# Patient Record
Sex: Female | Born: 1990
Health system: Southern US, Community
[De-identification: ages and names within clinical notes are randomized; demographics above are authoritative.]

## PROBLEM LIST (undated history)

## (undated) DIAGNOSIS — F419 Anxiety disorder, unspecified: Secondary | ICD-10-CM

## (undated) DIAGNOSIS — T8859XA Other complications of anesthesia, initial encounter: Secondary | ICD-10-CM

## (undated) DIAGNOSIS — K831 Obstruction of bile duct: Secondary | ICD-10-CM

## (undated) DIAGNOSIS — F32A Depression, unspecified: Secondary | ICD-10-CM

## (undated) DIAGNOSIS — O24419 Gestational diabetes mellitus in pregnancy, unspecified control: Secondary | ICD-10-CM

## (undated) DIAGNOSIS — Z9889 Other specified postprocedural states: Secondary | ICD-10-CM

## (undated) DIAGNOSIS — R112 Nausea with vomiting, unspecified: Secondary | ICD-10-CM

## (undated) DIAGNOSIS — T4145XA Adverse effect of unspecified anesthetic, initial encounter: Secondary | ICD-10-CM

## (undated) DIAGNOSIS — D649 Anemia, unspecified: Secondary | ICD-10-CM

## (undated) DIAGNOSIS — Z789 Other specified health status: Secondary | ICD-10-CM

## (undated) DIAGNOSIS — T7840XA Allergy, unspecified, initial encounter: Secondary | ICD-10-CM

## (undated) DIAGNOSIS — K219 Gastro-esophageal reflux disease without esophagitis: Secondary | ICD-10-CM

## (undated) HISTORY — DX: Other specified health status: Z78.9

## (undated) HISTORY — DX: Allergy, unspecified, initial encounter: T78.40XA

## (undated) HISTORY — PX: TUBAL LIGATION: SHX77

## (undated) HISTORY — DX: Gestational diabetes mellitus in pregnancy, unspecified control: O24.419

## (undated) HISTORY — PX: WISDOM TOOTH EXTRACTION: SHX21

## (undated) HISTORY — DX: Depression, unspecified: F32.A

---
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
## 2012-10-08 ENCOUNTER — Emergency Department: Payer: Self-pay | Admitting: Emergency Medicine

## 2012-10-08 LAB — CBC
HCT: 35.2 % (ref 35.0–47.0)
HGB: 11.4 g/dL — ABNORMAL LOW (ref 12.0–16.0)
RBC: 4.08 10*6/uL (ref 3.80–5.20)
RDW: 13.5 % (ref 11.5–14.5)
WBC: 13.4 10*3/uL — ABNORMAL HIGH (ref 3.6–11.0)

## 2012-10-08 LAB — ETHANOL: Ethanol %: <0.003 % (ref 0.000–0.080)

## 2012-10-08 LAB — COMPREHENSIVE METABOLIC PANEL
Anion Gap: 12 (ref 7–16)
Calcium, Total: 9.2 mg/dL (ref 8.5–10.1)
Co2: 22 mmol/L (ref 21–32)
EGFR (Non-African Amer.): >60
Osmolality: 284 (ref 275–301)
Potassium: 3.8 mmol/L (ref 3.5–5.1)
Sodium: 143 mmol/L (ref 136–145)

## 2012-10-08 LAB — SALICYLATE LEVEL: Salicylates, Serum: <1.7 mg/dL

## 2012-10-09 LAB — URINALYSIS, COMPLETE
Bilirubin,UR: NEGATIVE
Blood: NEGATIVE
Ketone: NEGATIVE
Ph: 6 (ref 4.5–8.0)
RBC,UR: 1 /HPF (ref 0–5)
Specific Gravity: 1.006 (ref 1.003–1.030)
Squamous Epithelial: <1

## 2012-10-09 LAB — DRUG SCREEN, URINE
Barbiturates, Ur Screen: NEGATIVE (ref ?–200)
Benzodiazepine, Ur Scrn: NEGATIVE (ref ?–200)
Cocaine Metabolite,Ur ~~LOC~~: NEGATIVE (ref ?–300)
MDMA (Ecstasy)Ur Screen: NEGATIVE (ref ?–500)
Methadone, Ur Screen: NEGATIVE (ref ?–300)
Opiate, Ur Screen: NEGATIVE (ref ?–300)

## 2012-11-15 ENCOUNTER — Inpatient Hospital Stay: Payer: Self-pay | Admitting: Psychiatry

## 2012-11-15 LAB — COMPREHENSIVE METABOLIC PANEL
Albumin: 4.2 g/dL (ref 3.4–5.0)
Alkaline Phosphatase: 80 U/L (ref 50–136)
Anion Gap: 4 — ABNORMAL LOW (ref 7–16)
BUN: 7 mg/dL (ref 7–18)
Bilirubin,Total: 0.2 mg/dL (ref 0.2–1.0)
Co2: 26 mmol/L (ref 21–32)
Creatinine: 0.71 mg/dL (ref 0.60–1.30)
EGFR (African American): >60
EGFR (Non-African Amer.): >60
Potassium: 4.3 mmol/L (ref 3.5–5.1)
SGPT (ALT): 22 U/L (ref 12–78)

## 2012-11-15 LAB — DRUG SCREEN, URINE
Amphetamines, Ur Screen: NEGATIVE (ref ?–1000)
Barbiturates, Ur Screen: NEGATIVE (ref ?–200)
Cannabinoid 50 Ng, Ur ~~LOC~~: NEGATIVE (ref ?–50)
MDMA (Ecstasy)Ur Screen: NEGATIVE (ref ?–500)
Phencyclidine (PCP) Ur S: NEGATIVE (ref ?–25)
Tricyclic, Ur Screen: NEGATIVE (ref ?–1000)

## 2012-11-15 LAB — CBC
HCT: 37.2 % (ref 35.0–47.0)
HGB: 12 g/dL (ref 12.0–16.0)
MCH: 27.9 pg (ref 26.0–34.0)
MCHC: 32.3 g/dL (ref 32.0–36.0)
RDW: 13.6 % (ref 11.5–14.5)
WBC: 8.7 10*3/uL (ref 3.6–11.0)

## 2012-11-15 LAB — TSH: Thyroid Stimulating Horm: 2.03 u[IU]/mL

## 2012-11-15 LAB — ETHANOL: Ethanol %: <0.003 % (ref 0.000–0.080)

## 2012-11-17 LAB — URINALYSIS, COMPLETE
Bilirubin,UR: NEGATIVE
Blood: NEGATIVE
Ketone: NEGATIVE
Nitrite: NEGATIVE
Ph: 6 (ref 4.5–8.0)
Squamous Epithelial: 14

## 2012-11-17 LAB — PREGNANCY, URINE: Pregnancy Test, Urine: NEGATIVE m[IU]/mL

## 2012-11-18 LAB — LITHIUM LEVEL: Lithium: 0.65 mmol/L

## 2015-02-16 NOTE — Consult Note (Signed)
Brief Consult Note: Diagnosis: Mood disorder NOS.   Patient was seen by consultant.   Recommend further assessment or treatment.   Orders entered.   Comments: Natalie Welch has a h/o depression and mood instability. She stopped her medications and tried to jump off the bridge last night. Gaynelle AduSher also threatened tio kill her parents.   PLAN: 1. Will admit to psychiatry when bed available.  Electronic Signatures: Kristine LineaPucilowska, Jolanta (MD)  (Signed 20-Jan-14 20:13)  Authored: Brief Consult Note   Last Updated: 20-Jan-14 20:13 by Kristine LineaPucilowska, Jolanta (MD)

## 2015-02-16 NOTE — H&P (Signed)
PATIENT NAME:  Natalie Welch, Natalie Welch MR#:  [id number] DATE OF BIRTH:  09-03-1991  DATE OF ADMISSION:  11/15/2012  REFERRING PHYSICIAN:  Dr. Daryel November.  ATTENDING PHYSICIAN:  Kristine Linea, M.D.   IDENTIFYING DATA: The patient is a 24 year old female with history of depression and severe anxiety.   CHIEF COMPLAINT: " I'm fine."   HISTORY OF PRESENT ILLNESS: The patient has a history of depression and anxiety. All her symptoms worsened when her boyfriend of 3 years left her August 2013. She became increasingly anxious and dysfunctional. She has not been able to drive her car. She depends heavily on her mother with whom she has a conflict.  Her major problem is anxiety with severe panic attacks. The patient has been seeing a psychiatrist and a therapist in [place]  in the [facility]. She likes arrangements, is happy with the services provided, even though she has to travel a long distances with her mother to see her providers. On the day prior to admission, the patient had an argument with her mother. She seems not to remember or chooses not to remember the details. The mother was quite frightened with their interaction, as the patient was threatening to hurt herself, kill her parents, jump off the bridge. The following day the family took petition to [employer] The police apprehended the patient when she was at work (she works as a Production designer, theatre/television/film) and was brought to the Emergency Room. The patient herself adamantly denied any problems whatsoever. She claimed that she missed an appointment with her psychiatrist today, but that she will schedule it for Friday.  Apparently, her  mother made in the emergency appointment on the day of admission following confrontation and suicidal threats the night before. The patient seems not to comprehend or  suppresses memories of some of the events. She reports that in the recent past she was treated with Nuvigil for depression and anxiety. Initially  it worked very, very well, but then the medication was no longer affective. She was started on lithium. She is currently taking 300 mg with a plan to increase the dose to 900 mg slowly in an outpatient setting. She has been tried on SSRIs that in the past, but did not have much improvement. She, at the moment, denies symptoms of depression and adamantly denies any suicidal thoughts. She does endorse symptoms of anxiety with panic attacks and symptoms suggestive of obsessive-compulsive traits. She denies psychosis. She denies symptoms suggestive of bipolar mania. She denies alcohol or illicit substance use.   PAST PSYCHIATRIC HISTORY: She has never been hospitalized. She is a patient at [facility] in [place]. She denies any suicide attempts.   FAMILY PSYCHIATRIC HISTORY: None reported.   PAST MEDICAL HISTORY: None.   ALLERGIES: No known drug allergies.   MEDICATIONS ON ADMISSION: Lithium 300 mg at night.   SOCIAL HISTORY: She used to live independently with a boyfriend for 3 years. She now moved back with her parents. They are good and loving family, but the patient has a conflict with the mother. She gets along with her father very well, reportedly. She works as a Press photographer. She has not been able to drive her car to appointments.     REVIEW OF SYSTEMS:  CONSTITUTIONAL: No fevers or chills. Positive for weight loss.  EYES: No double or blurred vision.  ENT: No hearing loss.  RESPIRATORY: No shortness of breath or cough.  CARDIOVASCULAR: No chest pain or orthopnea.  GASTROINTESTINAL: No abdominal pain, nausea,  vomiting, or diarrhea.  GENITORURINARY: No incontinence or frequency.  ENDOCRINE: No heat or cold intolerance.  LYMPHATIC: No anemia or easy bruising.  INTEGUMENTARY: No acne or rash.  MUSCULOSKELETAL: No muscle or joint pain.  NEUROLOGIC: No tingling or weakness.  PSYCHIATRIC: See history of present illness for details.   PHYSICAL EXAMINATION: VITAL  SIGNS: Blood pressure 121/72, pulse 93, respirations 18, temperature 98.9.  GENERAL: This is a slender, petite female, rather tearful.  HEENT: The pupils are equal, round, and reactive to light. Sclerae anicteric.  NECK: Supple. No thyromegaly.  LUNGS: Clear to auscultation. No dullness to percussion.  HEART: Regular rate and rhythm and rate. No murmurs, rubs, or gallops.   ABDOMEN: Soft, nontender, nondistended. Positive bowel sounds.  MUSCULOSKELETAL: Normal muscle strength in all extremities.  SKIN: No rashes or bruises.  LYMPHATIC: No cervical adenopathy.  NEUROLOGIC: Cranial nerves II through XII are intact.   LABORATORY DATA: Chemistries are within normal limits. Blood alcohol level is zero. LFTs within normal limits. TSH 2.03. Urine tox screen negative for substances. CBC within normal limits. Pregnancy test not done.   MENTAL STATUS EXAMINATION ON ADMISSION: The patient is alert and oriented to person, place, time and situation, although she gives me a different account of events leading to the current admission than her mother. She is pleasant, polite, and cooperative. She is anxious appearing. She is wearing hospital scrubs. She maintains good eye contact. Her speech is soft. Mood is depressed with tearful affect. Thought processing is logical and goal oriented. Thought content: She denies suicidal or homicidal ideation, but on the day of admission, she was threatening suicide and threatening to kill her parents. There are no delusions or paranoia. There are no auditory or visual hallucinations. Her cognition is grossly intact. She registers 3 out of 3 and recalls 3 out of 3 objects after 5 minutes. She can spell world forwards and backwards. She knows the current president. Her insight and judgment are questionable.   SUICIDE RISK ASSESSMENT: This is a patient with a history of depression and anxiety who became suicidal and homicidal in the context of family conflict.  DIAGNOSES: AXIS  I:  Mood disorder, not otherwise specified.  AXIS II: Deferred.  AXIS III: None.  AXIS IV: Mental illness, coping skills, family conflict.  AXIS V: GAF on admission 25.   PLAN: The patient was [facility] to [facility] for safety, stabilization and medication management. She was initially placed on suicide precautions and was closely monitored for any unsafe behaviors. She underwent full psychiatric and risk assessment. She received pharmacotherapy, individual and group psychotherapy, substance abuse counseling and support from therapeutic milieu.  1.  Suicidal or homicidal ideation. The patient denies.  2.  Mood. We will increase lithium to 300 mg 3 times daily. Offer sleeping aid and Remeron 15 mg at bedtime for depression, anxiety, sleep and appetite.   DISPOSITION: She will be discharged to home. Family meeting would be beneficial.     ____________________________ Ellin GoodieJolanta B. Jennet MaduroPucilowska, MD jbp:cc D: 11/16/2012 22:12:00 ET T: 11/16/2012 23:24:08 ET JOB#: [id number]  cc: Jolanta B. Jennet MaduroPucilowska, MD, <Dictator> Shari ProwsJOLANTA B PUCILOWSKA MD ELECTRONICALLY SIGNED 12/23/2012 6:37

## 2017-01-05 DIAGNOSIS — H9193 Unspecified hearing loss, bilateral: Secondary | ICD-10-CM | POA: Diagnosis not present

## 2017-01-07 DIAGNOSIS — H93293 Other abnormal auditory perceptions, bilateral: Secondary | ICD-10-CM | POA: Diagnosis not present

## 2017-01-07 DIAGNOSIS — H9313 Tinnitus, bilateral: Secondary | ICD-10-CM | POA: Diagnosis not present

## 2017-01-07 DIAGNOSIS — H93299 Other abnormal auditory perceptions, unspecified ear: Secondary | ICD-10-CM | POA: Diagnosis not present

## 2018-11-23 ENCOUNTER — Encounter: Payer: Self-pay | Admitting: Advanced Practice Midwife

## 2018-11-23 ENCOUNTER — Other Ambulatory Visit (HOSPITAL_COMMUNITY)
Admission: RE | Admit: 2018-11-23 | Discharge: 2018-11-23 | Disposition: A | Discharge status: Home / Self Care | Payer: BLUE CROSS/BLUE SHIELD | Source: Ambulatory Visit | Attending: Advanced Practice Midwife | Admitting: Advanced Practice Midwife

## 2018-11-23 ENCOUNTER — Ambulatory Visit (INDEPENDENT_AMBULATORY_CARE_PROVIDER_SITE_OTHER): Payer: BLUE CROSS/BLUE SHIELD | Admitting: Advanced Practice Midwife

## 2018-11-23 VITALS — BP 116/74 | Ht 60.0 in | Wt 136.0 lb

## 2018-11-23 DIAGNOSIS — Z124 Encounter for screening for malignant neoplasm of cervix: Secondary | ICD-10-CM

## 2018-11-23 DIAGNOSIS — O0993 Supervision of high risk pregnancy, unspecified, third trimester: Secondary | ICD-10-CM | POA: Insufficient documentation

## 2018-11-23 DIAGNOSIS — Z113 Encounter for screening for infections with a predominantly sexual mode of transmission: Secondary | ICD-10-CM

## 2018-11-23 DIAGNOSIS — Z3A01 Less than 8 weeks gestation of pregnancy: Secondary | ICD-10-CM

## 2018-11-23 DIAGNOSIS — Z3400 Encounter for supervision of normal first pregnancy, unspecified trimester: Secondary | ICD-10-CM | POA: Diagnosis not present

## 2018-11-23 DIAGNOSIS — Z3201 Encounter for pregnancy test, result positive: Secondary | ICD-10-CM

## 2018-11-23 DIAGNOSIS — Z3401 Encounter for supervision of normal first pregnancy, first trimester: Secondary | ICD-10-CM

## 2018-11-23 DIAGNOSIS — N912 Amenorrhea, unspecified: Secondary | ICD-10-CM

## 2018-11-23 LAB — POCT URINALYSIS DIPSTICK OB
Glucose, UA: NEGATIVE
POC,PROTEIN,UA: NEGATIVE

## 2018-11-23 LAB — POCT URINE PREGNANCY: Preg Test, Ur: POSITIVE — AB

## 2018-11-23 NOTE — Patient Instructions (Signed)
Exercise During Pregnancy For people of all ages, exercise is an important part of being healthy. Exercise improves heart and lung function and helps to maintain strength, flexibility, and a healthy body weight. Exercise also boosts energy levels and elevates mood. For most women, maintaining an exercise routine throughout pregnancy is recommended. It is only on rare occasions and with certain medical conditions or pregnancy complications that women may be asked to limit or avoid exercise during pregnancy. What are some other benefits to exercising during pregnancy? Along with maintaining strength and flexibility, exercising throughout pregnancy can help to:  Keep strength in muscles that are very important during labor and childbirth.  Decrease low back pain during pregnancy.  Decrease the risk of developing gestational diabetes mellitus (GDM).  Improve blood sugar (glucose) control for women who have GDM.  Decrease the risk of developing preeclampsia. This is a serious condition that causes high blood pressure along with other symptoms, such as swelling and headaches.  Decrease the risk of cesarean delivery.  Speed up the recovery after giving birth. How often should I exercise? Unless your health care provider gives you different instructions, you should try to exercise on most days or all days of the week. In general, try to exercise with moderate intensity for about 150 minutes per week. This can be spread out across several days, such as exercising for 30 minutes per day on 5 days of each week. You can tell that you are exercising at a moderate intensity if you have a higher heart rate and faster breathing, but you are still able to hold a conversation. What types of moderate-intensity exercise are recommended during pregnancy? There are many types of exercise that are safe for you to do during pregnancy. Unless your health care provider gives you different instructions, do a variety of  exercises that safely increase your heart and breathing (cardiopulmonary) rates and help you to build and maintain muscle strength (strength training). You should always be able to talk in full sentences while exercising during pregnancy. Some examples of exercising that is safe to do during pregnancy include:  Brisk walking or hiking.  Swimming.  Water aerobics.  Riding a stationary bike.  Strength training.  Modified yoga or Pilates. Tell your instructor that you are pregnant. Avoid overstretching and avoid lying on your back for long periods of time.  Running or jogging. Only choose this type of exercise if: ? You ran or jogged regularly before your pregnancy. ? You can run or jog and still talk in complete sentences. What types of exercise should I not do during pregnancy? Depending on your level of fitness and whether you exercised regularly before your pregnancy, you may be advised to limit vigorous-intensity exercise during your pregnancy. You can tell that you are exercising at a vigorous intensity if you are breathing much harder and faster and cannot hold a conversation while exercising. Some examples of exercising that you should avoid during pregnancy include:  Contact sports.  Activities that place you at risk for falling on or being hit in the belly, such as downhill skiing, water skiing, surfing, rock climbing, cycling, gymnastics, and horseback riding.  Scuba diving.  Sky diving.  Yoga or Pilates in a room that is heated to extreme temperatures ("hot yoga" or "hot Pilates").  Jogging or running, unless you ran or jogged regularly before your pregnancy. While jogging or running, you should always be able to talk in full sentences. Do not run or jog so vigorously that you   are unable to have a conversation.  If you are not used to exercising at elevation (more than 6,000 feet above sea level), do not do so during your pregnancy. When should I avoid exercising during  pregnancy? Certain medical conditions can make it unsafe to exercise during pregnancy, or they may increase your risk of miscarriage or early labor and birth. Some of these conditions include:  Some types of heart disease.  Some types of lung disease.  Placenta previa. This is when the placenta partially or completely covers the opening of the uterus (cervix).  Frequent bleeding from the vagina during your pregnancy.  Incompetent cervix. This is when your cervix does not remain as tightly closed during pregnancy as it should.  Premature labor.  Ruptured membranes. This is when the protective sac (amniotic sac) opens up and amniotic fluid leaks from your vagina.  Severely low blood count (anemia).  Preeclampsia or pregnancy-caused high blood pressure.  Carrying more than one baby (multiple gestation) and having an additional risk of early labor.  Poorly controlled diabetes.  Being severely underweight or severely overweight.  Intrauterine growth restriction. This is when your baby's growth and development during pregnancy are slower than expected.  Other medical conditions. Ask your health care provider if any apply to you. What else should I know about exercising during pregnancy? You should take these precautions while exercising during pregnancy:  Avoid overheating. ? Wear loose-fitting, breathable clothes. ? Do not exercise in very high temperatures.  Avoid dehydration. Drink enough water before, during, and after exercise to keep your urine clear or pale yellow.  Avoid overstretching. Because of hormone changes during pregnancy, it is easy to overstretch muscles, tendons, and ligaments during pregnancy.  Start slowly and ask your health care provider to recommend types of exercise that are safe for you, if exercising regularly is new for you. Pregnancy is not a time for exercising to lose weight. When should I seek medical care? You should stop exercising and call your  health care provider if you have any unusual symptoms, such as:  Mild uterine contractions or abdominal cramping.  Dizziness that does not improve with rest. When should I seek immediate medical care? You should stop exercising and call your local emergency services (911 in the [place] if you have any unusual symptoms, such as:  Sudden, severe pain in your low back or your belly.  Uterine contractions or abdominal cramping that do not improve with rest.  Chest pain.  Bleeding or fluid leaking from your vagina.  Shortness of breath. This information is not intended to replace advice given to you by your health care provider. Make sure you discuss any questions you have with your health care provider. Document Released: 10/13/2005 Document Revised: 03/12/2016 Document Reviewed: 12/21/2014 Elsevier Interactive Patient Education  2019 [employer] Eating Plan for Pregnant Women While you are pregnant, your body requires additional nutrition to help support your growing baby. You also have a higher need for some vitamins and minerals, such as folic acid, calcium, iron, and vitamin D. Eating a healthy, well-balanced diet is very important for your health and your baby's health. Your need for extra calories varies for the three 3-month segments of your pregnancy (trimesters). For most women, it is recommended to consume:  150 extra calories a day during the first trimester.  300 extra calories a day during the second trimester.  300 extra calories a day during the third trimester. What are tips for following this plan?   Do   not try to lose weight or go on a diet during pregnancy.  Limit your overall intake of foods that have "empty calories." These are foods that have little nutritional value, such as sweets, desserts, candies, and sugar-sweetened beverages.  Eat a variety of foods (especially fruits and vegetables) to get a full range of vitamins and minerals.  Take a prenatal vitamin  to help meet your additional vitamin and mineral needs during pregnancy, specifically for folic acid, iron, calcium, and vitamin D.  Remember to stay active. Ask your health care provider what types of exercise and activities are safe for you.  Practice good food safety and cleanliness. Wash your hands before you eat and after you prepare raw meat. Wash all fruits and vegetables well before peeling or eating. Taking these actions can help to prevent food-borne illnesses that can be very dangerous to your baby, such as listeriosis. Ask your health care provider for more information about listeriosis. What does 150 extra calories look like? Healthy options that provide 150 extra calories each day could be any of the following:  6-8 oz (170-230 g) of plain low-fat yogurt with  cup of berries.  1 apple with 2 teaspoons (11 g) of peanut butter.  Cut-up vegetables with  cup (60 g) of hummus.  8 oz (230 mL) or 1 cup of low-fat chocolate milk.  1 stick of string cheese with 1 medium orange.  1 peanut butter and jelly sandwich that is made with one slice of whole-wheat bread and 1 tsp (5 g) of peanut butter. For 300 extra calories, you could eat two of those healthy options each day. What is a healthy amount of weight to gain? The right amount of weight gain for you is based on your BMI before you became pregnant. If your BMI:  Was less than 18 (underweight), you should gain 28-40 lb (13-18 kg).  Was 18-24.9 (normal), you should gain 25-35 lb (11-16 kg).  Was 25-29.9 (overweight), you should gain 15-25 lb (7-11 kg).  Was 30 or greater (obese), you should gain 11-20 lb (5-9 kg). What if I am having twins or multiples? Generally, if you are carrying twins or multiples:  You may need to eat 300-600 extra calories a day.  The recommended range for total weight gain is 25-54 lb (11-25 kg), depending on your BMI before pregnancy.  Talk with your health care provider to find out about  nutritional needs, weight gain, and exercise that is right for you. What foods can I eat?  Grains All grains. Choose whole grains, such as whole-wheat bread, oatmeal, or brown rice. Vegetables All vegetables. Eat a variety of colors and types of vegetables. Remember to wash your vegetables well before peeling or eating. Fruits All fruits. Eat a variety of colors and types of fruit. Remember to wash your fruits well before peeling or eating. Meats and other protein foods Lean meats, including chicken, turkey, fish, and lean cuts of beef, veal, or pork. If you eat fish or seafood, choose options that are higher in omega-3 fatty acids and lower in mercury, such as salmon, herring, mussels, trout, sardines, pollock, shrimp, crab, and lobster. Tofu. Tempeh. Beans. Eggs. Peanut butter and other nut butters. Make sure that all meats, poultry, and eggs are cooked to food-safe temperatures or "well-done." Two or more servings of fish are recommended each week in order to get the most benefits from omega-3 fatty acids that are found in seafood. Choose fish that are lower in mercury. You can   find more information online:  www.fda.gov Dairy Pasteurized milk and milk alternatives (such as almond milk). Pasteurized yogurt and pasteurized cheese. Cottage cheese. Sour cream. Beverages Water. Juices that contain 100% fruit juice or vegetable juice. Caffeine-free teas and decaffeinated coffee. Drinks that contain caffeine are okay to drink, but it is better to avoid caffeine. Keep your total caffeine intake to less than 200 mg each day (which is 12 oz or 355 mL of coffee, tea, or soda) or the limit as told by your health care provider. Fats and oils Fats and oils are okay to include in moderation. Sweets and desserts Sweets and desserts are okay to include in moderation. Seasoning and other foods All pasteurized condiments. The items listed above may not be a complete list of recommended foods and beverages.  Contact your dietitian for more options. What foods are not recommended? Vegetables Raw (unpasteurized) vegetable juices. Fruits Unpasteurized fruit juices. Meats and other protein foods Lunch meats, bologna, hot dogs, or other deli meats. (If you must eat those meats, reheat them until they are steaming hot.) Refrigerated pat, meat spreads from a meat counter, smoked seafood that is found in the refrigerated section of a store. Raw or undercooked meats, poultry, and eggs. Raw fish, such as sushi or sashimi. Fish that have high mercury content, such as tilefish, shark, swordfish, and king mackerel. To learn more about mercury in fish, talk with your health care provider or look for online resources, such as:  www.fda.gov Dairy Raw (unpasteurized) milk and any foods that have raw milk in them. Soft cheeses, such as feta, queso blanco, queso fresco, Brie, Camembert cheeses, blue-veined cheeses, and Panela cheese (unless it is made with pasteurized milk, which must be stated on the label). Beverages Alcohol. Sugar-sweetened beverages, such as sodas, teas, or energy drinks. Seasoning and other foods Homemade fermented foods and drinks, such as pickles, sauerkraut, or kombucha drinks. (Store-bought pasteurized versions of these are okay.) Salads that are made in a store or deli, such as ham salad, chicken salad, egg salad, tuna salad, and seafood salad. The items listed above may not be a complete list of foods and beverages to avoid. Contact your dietitian for more information. Where to find more information To calculate the number of calories you need based on your height, weight, and activity level, you can use an online calculator such as:  www.choosemyplate.gov/MyPlatePlan To calculate how much weight you should gain during pregnancy, you can use an online pregnancy weight gain calculator such as:  www.choosemyplate.gov/pregnancy-weight-gain-calculator Summary  While you are pregnant,  your body requires additional nutrition to help support your growing baby.  Eat a variety of foods, especially fruits and vegetables to get a full range of vitamins and minerals.  Practice good food safety and cleanliness. Wash your hands before you eat and after you prepare raw meat. Wash all fruits and vegetables well before peeling or eating. Taking these actions can help to prevent food-borne illnesses, such as listeriosis, that can be very dangerous to your baby.  Do not eat raw meat or fish. Do not eat fish that have high mercury content, such as tilefish, shark, swordfish, and king mackerel. Do not eat unpasteurized (raw) dairy.  Take a prenatal vitamin to help meet your additional vitamin and mineral needs during pregnancy, specifically for folic acid, iron, calcium, and vitamin D. This information is not intended to replace advice given to you by your health care provider. Make sure you discuss any questions you have with your health care   provider. Document Released: 07/28/2014 Document Revised: 07/10/2017 Document Reviewed: 07/10/2017 Elsevier Interactive Patient Education  2019 [employer] Prenatal Care Prenatal care is health care during pregnancy. It helps you and your unborn baby (fetus) stay as healthy as possible. Prenatal care may be provided by a midwife, a family practice health care provider, or a childbirth and pregnancy specialist (obstetrician). How does this affect me? During pregnancy, you will be closely monitored for any new conditions that might develop. To lower your risk of pregnancy complications, you and your health care provider will talk about any underlying conditions you have. How does this affect my baby? Early and consistent prenatal care increases the chance that your baby will be healthy during pregnancy. Prenatal care lowers the risk that your baby will be:  Born early (prematurely).  Smaller than expected at birth (small for gestational age). What  can I expect at the first prenatal care visit? Your first prenatal care visit will likely be the longest. You should schedule your first prenatal care visit as soon as you know that you are pregnant. Your first visit is a good time to talk about any questions or concerns you have about pregnancy. At your visit, you and your health care provider will talk about:  Your medical history, including: ? Any past pregnancies. ? Your family's medical history. ? The baby's father's medical history. ? Any long-term (chronic) health conditions you have and how you manage them. ? Any surgeries or procedures you have had. ? Any current over-the-counter or prescription medicines, herbs, or supplements you are taking.  Other factors that could pose a risk to your baby, including:  Your home setting and your stress levels, including: ? Exposure to abuse or violence. ? Household financial strain. ? Mental health conditions you have.  Your daily health habits, including diet and exercise. Your health care provider will also:  Measure your weight, height, and blood pressure.  Do a physical exam, including a pelvic and breast exam.  Perform blood tests and urine tests to check for: ? Urinary tract infection. ? Sexually transmitted infections (STIs). ? Low iron levels in your blood (anemia). ? Blood type and certain proteins on red blood cells (Rh antibodies). ? Infections and immunity to viruses, such as hepatitis B and rubella. ? HIV (human immunodeficiency virus).  Do an ultrasound to confirm your baby's growth and development and to help predict your estimated due date (EDD). This ultrasound is done with a probe that is inserted into the vagina (transvaginal ultrasound).  Discuss your options for genetic screening.  Give you information about how to keep yourself and your baby healthy, including: ? Nutrition and taking vitamins. ? Physical activity. ? How to manage pregnancy symptoms such as  nausea and vomiting (morning sickness). ? Infections and substances that may be harmful to your baby and how to avoid them. ? Food safety. ? Dental care. ? Working. ? Travel. ? Warning signs to watch for and when to call your health care provider. How often will I have prenatal care visits? After your first prenatal care visit, you will have regular visits throughout your pregnancy. The visit schedule is often as follows:  Up to week 28 of pregnancy: once every 4 weeks.  28-36 weeks: once every 2 weeks.  After 36 weeks: every week until delivery. Some women may have visits more or less often depending on any underlying health conditions and the health of the baby. Keep all follow-up and prenatal care visits as told by   your health care provider. This is important. What happens during routine prenatal care visits? Your health care provider will:  Measure your weight and blood pressure.  Check for fetal heart sounds.  Measure the height of your uterus in your abdomen (fundal height). This may be measured starting around week 20 of pregnancy.  Check the position of your baby inside your uterus.  Ask questions about your diet, sleeping patterns, and whether you can feel the baby move.  Review warning signs to watch for and signs of labor.  Ask about any pregnancy symptoms you are having and how you are dealing with them. Symptoms may include: ? Headaches. ? Nausea and vomiting. ? Vaginal discharge. ? Swelling. ? Fatigue. ? Constipation. ? Any discomfort, including back or pelvic pain. Make a list of questions to ask your health care provider at your routine visits. What tests might I have during prenatal care visits? You may have blood, urine, and imaging tests throughout your pregnancy, such as:  Urine tests to check for glucose, protein, or signs of infection.  Glucose tests to check for a form of diabetes that can develop during pregnancy (gestational diabetes mellitus).  This is usually done around week 24 of pregnancy.  An ultrasound to check your baby's growth and development and to check for birth defects. This is usually done around week 20 of pregnancy.  A test to check for group B strep (GBS) infection. This is usually done around week 36 of pregnancy.  Genetic testing. This may include blood or imaging tests, such as an ultrasound. Some genetic tests are done during the first trimester and some are done during the second trimester. What else can I expect during prenatal care visits? Your health care provider may recommend getting certain vaccines during pregnancy. These may include:  A yearly flu shot (annual influenza vaccine). This is especially important if you will be pregnant during flu season.  Tdap (tetanus, diphtheria, pertussis) vaccine. Getting this vaccine during pregnancy can protect your baby from whooping cough (pertussis) after birth. This vaccine may be recommended between weeks 27 and 36 of pregnancy. Later in your pregnancy, your health care provider may give you information about:  Childbirth and breastfeeding classes.  Choosing a health care provider for your baby.  Umbilical cord banking.  Breastfeeding.  Birth control after your baby is born.  The hospital labor and delivery unit and how to tour it.  Registering at the hospital before you go into labor. Where to find more information  Office on Women's Health: womenshealth.gov  American Pregnancy Association: americanpregnancy.org  March of Dimes: marchofdimes.org Summary  Prenatal care helps you and your baby stay as healthy as possible during pregnancy.  Your first prenatal care visit will most likely be the longest.  You will have visits and tests throughout your pregnancy to monitor your health and your baby's health.  Bring a list of questions to your visits to ask your health care provider.  Make sure to keep all follow-up and prenatal care visits with  your health care provider. This information is not intended to replace advice given to you by your health care provider. Make sure you discuss any questions you have with your health care provider. Document Released: 10/16/2003 Document Revised: 10/12/2017 Document Reviewed: 10/12/2017 Elsevier Interactive Patient Education  2019 [employer]  

## 2018-11-23 NOTE — Progress Notes (Signed)
New Obstetric Patient H&P    Chief Complaint: "Desires prenatal care"   History of Present Illness: Patient is a 28 y.o. G1P0000 Not Hispanic or Latino female, presents with amenorrhea and positive home pregnancy test. Patient's last menstrual period was 10/17/2018 (exact date). and based on her  LMP, her EDD is Estimated Date of Delivery: 07/24/19 and her EGA is 3121w2d. Cycles are 6. days, regular, and occur approximately every : 28 days. Her last pap smear was 4 or 5 years ago and was no abnormalities.    She had a urine pregnancy test which was positive 1 or 2 week(s)  ago. Her last menstrual period was normal and lasted for  6 day(s). Since her LMP she claims she has experienced breast tenderness, fatigue. She denies vaginal bleeding. Her past medical history is noncontributory. This is her first pregnancy.  Since her LMP, she admits to the use of tobacco products  no She claims she has gained   no pounds since the start of her pregnancy.  There are cats in the home in the home  no  She admits close contact with children on a regular basis  no  She has had chicken pox in the past yes She has had Tuberculosis exposures, symptoms, or previously tested positive for TB   no Current or past history of domestic violence. no  Genetic Screening/Teratology Counseling: (Includes patient, baby's father, or anyone in either family with:)   1. [facility]  no 2. Thalassemia ([place], [place], Mediterranean, or Asian background): MCV<80  no 3. Neural tube defect (meningomyelocele, spina bifida, anencephaly)  no 4. Congenital heart defect  no  5. Down syndrome  no 6. Tay-Sachs (Jewish, [place])  no 7. Canavan's Disease  no 8. Sickle cell disease or trait (African)  no  9. Hemophilia or other blood disorders  no  10. Muscular dystrophy  no  11. Cystic fibrosis  no  12. Huntington's Chorea  no  13. Mental retardation/autism  no 14. Other inherited genetic or chromosomal  disorder  no 15. Maternal metabolic disorder (DM, PKU, etc)  no 16. Patient or [name] with a child with a birth defect not listed above no  16a. Patient or [name] with a birth defect themselves no 17. Recurrent pregnancy loss, or stillbirth  no  18. Any medications since LMP other than prenatal vitamins (include vitamins, supplements, OTC meds, drugs, alcohol)  no 19. Any other genetic/environmental exposure to discuss  no  Infection History:   1. Lives with someone with TB or TB exposed  no  2. Patient or partner has history of genital herpes  no 3. Rash or viral illness since LMP  no 4. History of STI (GC, CT, HPV, syphilis, HIV)  no 5. History of recent travel :  no  Other pertinent information:  no     Review of Systems:10 point review of systems negative unless otherwise noted in HPI  Past Medical History:  Past Medical History:  Diagnosis Date  . No known health problems     Past Surgical History:  Past Surgical History:  Procedure Laterality Date  . NO PAST SURGERIES      Gynecologic History: Patient's last menstrual period was 10/17/2018 (exact date).  Obstetric History: G1P0000  Family History:  History reviewed Paternal Great Aunts with breast cancer (4), all diagnosed at age greater than 5950.  Social History:  Social History   Socioeconomic History  . Marital status: Single  Spouse name: Not on file  . Number of children: Not on file  . Years of education: Not on file  . Highest education level: Not on file  Occupational History  . Not on file  Social Needs  . Financial resource strain: Not on file  . Food insecurity:    Worry: Not on file    Inability: Not on file  . Transportation needs:    Medical: Not on file    Non-medical: Not on file  Tobacco Use  . Smoking status: Never Smoker  . Smokeless tobacco: Never Used  Substance and Sexual Activity  . Alcohol use: Yes    Comment: OCc  . Drug use: Never  . Sexual activity: Yes    Birth  control/protection: None  Lifestyle  . Physical activity:    Days per week: Not on file    Minutes per session: Not on file  . Stress: Not on file  Relationships  . Social connections:    Talks on phone: Not on file    Gets together: Not on file    Attends religious service: Not on file    Active member of club or organization: Not on file    Attends meetings of clubs or organizations: Not on file    Relationship status: Not on file  . Intimate partner violence:    Fear of current or ex partner: Not on file    Emotionally abused: Not on file    Physically abused: Not on file    Forced sexual activity: Not on file  Other Topics Concern  . Not on file  Social History Narrative  . Not on file    Allergies:  Not on File  Medications: Prior to Admission medications   Not on File    Physical Exam Vitals: Blood pressure 116/74, weight 136 lb (61.7 kg), last menstrual period 10/17/2018.  General: NAD HEENT: normocephalic, anicteric Thyroid: no enlargement, no palpable nodules Pulmonary: No increased work of breathing, CTAB Cardiovascular: RRR, distal pulses 2+ Abdomen: NABS, soft, non-tender, non-distended.  Umbilicus without lesions.  No hepatomegaly, splenomegaly or masses palpable. No evidence of hernia  Genitourinary:  External: Normal external female genitalia.  Normal urethral meatus, normal  Bartholin's and Skene's glands.    Vagina: Normal vaginal mucosa, no evidence of prolapse.    Cervix: Grossly normal in appearance, no bleeding  Uterus: deferred for no concerns  Adnexa: deferred for no concerns   Rectal: deferred Extremities: no edema, erythema, or tenderness Neurologic: Grossly intact Psychiatric: mood appropriate, affect full   Assessment: 28 y.o. G1P0000 at [redacted]w[redacted]d presenting to initiate prenatal care  Plan: 1) Avoid alcoholic beverages. 2) Patient encouraged not to smoke.  3) Discontinue the use of all non-medicinal drugs and chemicals.  4) Take  prenatal vitamins daily.  5) Nutrition, food safety (fish, cheese advisories, and high nitrite foods) and exercise discussed. 6) Hospital and practice style discussed with cross coverage system.  7) Genetic Screening, such as with 1st Trimester Screening, cell free fetal DNA, AFP testing, and Ultrasound, as well as with amniocentesis and CVS as appropriate, is discussed with patient. At the conclusion of today's visit patient undecided about genetic testing 8) Patient is asked about travel to areas at risk for the Zika virus, and counseled to avoid travel and exposure to mosquitoes or sexual partners who may have themselves been exposed to the virus. Testing is discussed, and will be ordered as appropriate.  9) NOB labs today 10) Return in 2 weeks for  dating scan and rob   Tresea Mall, CNM [facility], [facility] 11/23/2018, 1:48 PM

## 2018-11-24 LAB — RPR+RH+ABO+RUB AB+AB SCR+CB...
Antibody Screen: NEGATIVE
HIV SCREEN 4TH GENERATION: NONREACTIVE
Hematocrit: 36.6 % (ref 34.0–46.6)
Hemoglobin: 12.5 g/dL (ref 11.1–15.9)
Hepatitis B Surface Ag: NEGATIVE
MCH: 29.1 pg (ref 26.6–33.0)
MCHC: 34.2 g/dL (ref 31.5–35.7)
MCV: 85 fL (ref 79–97)
Platelets: 311 10*3/uL (ref 150–450)
RBC: 4.3 x10E6/uL (ref 3.77–5.28)
RDW: 12.7 % (ref 11.7–15.4)
RPR Ser Ql: NONREACTIVE
Rh Factor: POSITIVE
Rubella Antibodies, IGG: 2.58 index (ref >0.99)
Varicella zoster IgG: 1007 index (ref >165)
WBC: 8 10*3/uL (ref 3.4–10.8)

## 2018-11-25 LAB — CYTOLOGY - PAP
Chlamydia: NEGATIVE
Diagnosis: NEGATIVE
Neisseria Gonorrhea: NEGATIVE
Trichomonas: NEGATIVE

## 2018-11-25 LAB — URINE CULTURE

## 2018-12-06 ENCOUNTER — Telehealth: Payer: Self-pay

## 2018-12-06 ENCOUNTER — Emergency Department
Admission: EM | Admit: 2018-12-06 | Discharge: 2018-12-06 | Disposition: A | Discharge status: Home / Self Care | Payer: BLUE CROSS/BLUE SHIELD | Attending: Emergency Medicine | Admitting: Emergency Medicine

## 2018-12-06 ENCOUNTER — Encounter: Payer: Self-pay | Admitting: Emergency Medicine

## 2018-12-06 ENCOUNTER — Other Ambulatory Visit: Payer: Self-pay

## 2018-12-06 DIAGNOSIS — Z3201 Encounter for pregnancy test, result positive: Secondary | ICD-10-CM | POA: Insufficient documentation

## 2018-12-06 DIAGNOSIS — Z3A01 Less than 8 weeks gestation of pregnancy: Secondary | ICD-10-CM | POA: Insufficient documentation

## 2018-12-06 DIAGNOSIS — O210 Mild hyperemesis gravidarum: Secondary | ICD-10-CM | POA: Diagnosis not present

## 2018-12-06 DIAGNOSIS — O219 Vomiting of pregnancy, unspecified: Secondary | ICD-10-CM | POA: Diagnosis not present

## 2018-12-06 LAB — COMPREHENSIVE METABOLIC PANEL
ALT: 17 U/L (ref 0–44)
AST: 20 U/L (ref 15–41)
Albumin: 4.3 g/dL (ref 3.5–5.0)
Alkaline Phosphatase: 45 U/L (ref 38–126)
Anion gap: 5 (ref 5–15)
BUN: 7 mg/dL (ref 6–20)
CO2: 23 mmol/L (ref 22–32)
Calcium: 9 mg/dL (ref 8.9–10.3)
Chloride: 108 mmol/L (ref 98–111)
Creatinine, Ser: 0.66 mg/dL (ref 0.44–1.00)
GFR calc Af Amer: >60 mL/min (ref >60)
GFR calc non Af Amer: >60 mL/min (ref >60)
Glucose, Bld: 99 mg/dL (ref 70–99)
Potassium: 4.1 mmol/L (ref 3.5–5.1)
Sodium: 136 mmol/L (ref 135–145)
Total Bilirubin: 0.5 mg/dL (ref 0.3–1.2)
Total Protein: 7.5 g/dL (ref 6.5–8.1)

## 2018-12-06 LAB — CBC
HCT: 36.8 % (ref 36.0–46.0)
Hemoglobin: 12 g/dL (ref 12.0–15.0)
MCH: 28.1 pg (ref 26.0–34.0)
MCHC: 32.6 g/dL (ref 30.0–36.0)
MCV: 86.2 fL (ref 80.0–100.0)
Platelets: 282 10*3/uL (ref 150–400)
RBC: 4.27 MIL/uL (ref 3.87–5.11)
RDW: 12.9 % (ref 11.5–15.5)
WBC: 10.4 10*3/uL (ref 4.0–10.5)
nRBC: 0 % (ref 0.0–0.2)

## 2018-12-06 LAB — HCG, QUANTITATIVE, PREGNANCY: hCG, Beta Chain, Quant, S: 60247 m[IU]/mL — ABNORMAL HIGH (ref <5)

## 2018-12-06 MED ORDER — ONDANSETRON HCL 4 MG/2ML IJ SOLN
4.0000 mg | Freq: Once | INTRAMUSCULAR | Status: AC
  Administered 2018-12-06: 4 mg via INTRAVENOUS
  Filled 2018-12-06: qty 2

## 2018-12-06 MED ORDER — SODIUM CHLORIDE 0.9 % IV SOLN
1000.0000 mL | Freq: Once | INTRAVENOUS | Status: AC
  Administered 2018-12-06: 1000 mL via INTRAVENOUS

## 2018-12-06 MED ORDER — METOCLOPRAMIDE HCL 10 MG PO TABS: 10.0000 mg | ORAL_TABLET | Freq: Three times a day (TID) | ORAL | 0 refills | Status: DC | PRN

## 2018-12-06 NOTE — Telephone Encounter (Signed)
Pt is 7wks; not keeping anything down; projectile vomiting.  [phone].  Pt is taking the smallest of sips of water, gingerale, pedialyte and still throws it up.  Spoke c [facility] who adv to go to ED.  Pt aware.

## 2018-12-06 NOTE — ED Provider Notes (Signed)
[facility] Emergency Department Provider Note   ____________________________________________    I have reviewed the triage vital signs and the nursing notes.   HISTORY  Chief Complaint Emesis During Pregnancy     HPI Natalie Welch is a 28 y.o. female who presents with complaints of nausea vomiting.  Patient reports she is [redacted] weeks pregnant, this is her first pregnancy.  Called her OB because of inability to tolerate p.o.'s over the last 24 hours and was sent to the emergency department.  Denies abdominal pain.  No vaginal bleeding.  No diarrhea.  No fevers chills or recent travel.  Has not take anything for this.  Past Medical History:  Diagnosis Date  . No known health problems     Patient Active Problem List   Diagnosis Date Noted  . Supervision of normal first pregnancy, antepartum 11/23/2018    Past Surgical History:  Procedure Laterality Date  . NO PAST SURGERIES      Prior to Admission medications   Medication Sig Start Date End Date Taking? Authorizing Provider  metoCLOPramide (REGLAN) 10 MG tablet Take 1 tablet (10 mg total) by mouth every 8 (eight) hours as needed for nausea. 12/06/18 12/06/19  Jene Every, MD     Allergies Abilify [aripiprazole] and Nuvigil [armodafinil]  History reviewed. No pertinent family history.  Social History Social History   Tobacco Use  . Smoking status: Never Smoker  . Smokeless tobacco: Never Used  Substance Use Topics  . Alcohol use: Yes    Comment: OCc  . Drug use: Never    Review of Systems  Constitutional: No fever/chills Eyes: No visual changes.  ENT: No neck pain Cardiovascular: Denies chest pain. Respiratory: Denies shortness of breath. Gastrointestinal: as above Genitourinary: Negative for dysuria.  No vaginal bleeding Musculoskeletal: Negative for myalgias Skin: Negative for rash. Neurological: Mild headache   ____________________________________________   PHYSICAL  EXAM:  VITAL SIGNS: ED Triage Vitals [12/06/18 1013]  Enc Vitals Group     BP 135/79     Pulse Rate 95     Resp 16     Temp 98.4 F (36.9 C)     Temp Source Oral     SpO2 99 %     Weight 59.9 kg (132 lb)     Height 1.524 m (5')     Head Circumference      Peak Flow      Pain Score 0     Pain Loc      Pain Edu?      Excl. in GC?     Constitutional: Alert and oriented. No acute distress. Pleasant and interactive Eyes: Conjunctivae are normal.   Nose: No congestion/rhinnorhea. Mouth/Throat: Mucous membranes are moist.   Neck:  Painless ROM Cardiovascular: Normal rate, regular rhythm. Grossly normal heart sounds.  Good peripheral circulation. Respiratory: Normal respiratory effort.  No retractions Gastrointestinal: Soft and nontender. No distention.  No CVA tenderness.  Musculoskeletal: No lower extremity tenderness nor edema.  Warm and well perfused Neurologic:  Normal speech and language. No gross focal neurologic deficits are appreciated.  Skin:  Skin is warm, dry and intact. No rash noted. Psychiatric: Mood and affect are normal. Speech and behavior are normal.  ____________________________________________   LABS (all labs ordered are listed, but only abnormal results are displayed)  Labs Reviewed  HCG, QUANTITATIVE, PREGNANCY - Abnormal; Notable for the following components:      Result Value   hCG, Beta Chain, Quant, S 60,247 (*)  All other components within normal limits  CBC  COMPREHENSIVE METABOLIC PANEL   ____________________________________________  EKG  None ____________________________________________  RADIOLOGY   ____________________________________________   PROCEDURES  Procedure(s) performed: No  Procedures   Critical Care performed: No ____________________________________________   INITIAL IMPRESSION / ASSESSMENT AND PLAN / ED COURSE  Pertinent labs & imaging results that were available during my care of the patient were  reviewed by me and considered in my medical decision making (see chart for details).  Patient presents with nausea and vomiting likely related to morning sickness, will give IV fluids, single dose of IV Zofran and check labs and reevaluate.  Lab work is unremarkable, exam is reassuring.  ----------------------------------------- 1:03 PM on 12/06/2018 -----------------------------------------  Patient feeling much better, appropriate for discharge at this time.    ____________________________________________   FINAL CLINICAL IMPRESSION(S) / ED DIAGNOSES  Final diagnoses:  Morning sickness        Note:  This document was prepared using Dragon voice recognition software and may include unintentional dictation errors.   Jene EveryKinner, Cuthbert Turton, MD 12/06/18 [phone]

## 2018-12-06 NOTE — ED Triage Notes (Signed)
Here for vomiting. Has had numerous episodes of vomiting. Called her OB and they told her to come to ED. No fever. No abdominal pain.  Unlabored. VSS

## 2018-12-07 ENCOUNTER — Encounter: Payer: BLUE CROSS/BLUE SHIELD | Admitting: Obstetrics and Gynecology

## 2018-12-07 ENCOUNTER — Ambulatory Visit (INDEPENDENT_AMBULATORY_CARE_PROVIDER_SITE_OTHER): Payer: BLUE CROSS/BLUE SHIELD

## 2018-12-07 ENCOUNTER — Ambulatory Visit (INDEPENDENT_AMBULATORY_CARE_PROVIDER_SITE_OTHER): Payer: BLUE CROSS/BLUE SHIELD | Admitting: Certified Nurse Midwife

## 2018-12-07 ENCOUNTER — Other Ambulatory Visit: Payer: Self-pay | Admitting: Advanced Practice Midwife

## 2018-12-07 VITALS — BP 108/54 | Wt 134.0 lb

## 2018-12-07 DIAGNOSIS — Z3A01 Less than 8 weeks gestation of pregnancy: Secondary | ICD-10-CM | POA: Diagnosis not present

## 2018-12-07 DIAGNOSIS — Z3400 Encounter for supervision of normal first pregnancy, unspecified trimester: Secondary | ICD-10-CM

## 2018-12-07 DIAGNOSIS — O3481 Maternal care for other abnormalities of pelvic organs, first trimester: Secondary | ICD-10-CM

## 2018-12-07 DIAGNOSIS — N8311 Corpus luteum cyst of right ovary: Secondary | ICD-10-CM

## 2018-12-07 DIAGNOSIS — O020 Blighted ovum and nonhydatidiform mole: Secondary | ICD-10-CM | POA: Diagnosis not present

## 2018-12-07 MED ORDER — PROMETHAZINE HCL 12.5 MG PO TABS: 12.5000 mg | ORAL_TABLET | Freq: Four times a day (QID) | ORAL | 1 refills | Status: DC | PRN

## 2018-12-07 NOTE — Progress Notes (Signed)
Dating scan today at [facility] by ultrasound: Intrauterine gestational sac is seen but no yolk sac or fetal pole seen. Patient sure of LMP-documented in phone. Was seen in ER yesterday for nausea and vomiting and hydrated with IV fluids. Reglan prescribed, but not helping. Will try phenergan 12.5 mgm orally every 6 hours. Drinking Pedialyte, Gatorade, ginger ale. Also having headaches. Did cut back on caffeine quite a bit-advised to try drinking caffienated beverage after taking the phergan to see if that would help headache. Tylenol did not help.  A: Blighted ovum vs early pregnancy Nausea and vomiting of pregnancy  P: Repeat viability scan in 10 days As above: phenergan for nausea  Farrel Conners, CNM

## 2018-12-07 NOTE — Progress Notes (Signed)
ROB Dating Ultrasound 

## 2018-12-08 ENCOUNTER — Telehealth: Payer: Self-pay

## 2018-12-08 NOTE — Telephone Encounter (Signed)
No that would not make a difference

## 2018-12-08 NOTE — Telephone Encounter (Signed)
Pt saw [facility] yesterday; u/s - no baby just sac; ?blited ovum.  Years ago pt was told she hada tilted cx.  Doesn't know if that would make a difference or not. Was supposed to have been 5215w2d yesterday.  [phone]

## 2018-12-08 NOTE — Telephone Encounter (Signed)
Pt aware.

## 2018-12-17 ENCOUNTER — Encounter: Payer: BLUE CROSS/BLUE SHIELD | Admitting: Obstetrics and Gynecology

## 2018-12-17 ENCOUNTER — Other Ambulatory Visit: Payer: BLUE CROSS/BLUE SHIELD

## 2018-12-24 ENCOUNTER — Ambulatory Visit (INDEPENDENT_AMBULATORY_CARE_PROVIDER_SITE_OTHER): Payer: BLUE CROSS/BLUE SHIELD | Admitting: Obstetrics and Gynecology

## 2018-12-24 ENCOUNTER — Encounter: Payer: Self-pay | Admitting: Obstetrics and Gynecology

## 2018-12-24 ENCOUNTER — Other Ambulatory Visit: Payer: Self-pay | Admitting: Certified Nurse Midwife

## 2018-12-24 ENCOUNTER — Ambulatory Visit (INDEPENDENT_AMBULATORY_CARE_PROVIDER_SITE_OTHER): Payer: BLUE CROSS/BLUE SHIELD

## 2018-12-24 VITALS — Wt 136.0 lb

## 2018-12-24 DIAGNOSIS — O020 Blighted ovum and nonhydatidiform mole: Secondary | ICD-10-CM

## 2018-12-24 DIAGNOSIS — O021 Missed abortion: Secondary | ICD-10-CM

## 2018-12-24 DIAGNOSIS — Z3A01 Less than 8 weeks gestation of pregnancy: Secondary | ICD-10-CM | POA: Diagnosis not present

## 2018-12-24 DIAGNOSIS — O3481 Maternal care for other abnormalities of pelvic organs, first trimester: Secondary | ICD-10-CM

## 2018-12-24 DIAGNOSIS — Z3400 Encounter for supervision of normal first pregnancy, unspecified trimester: Secondary | ICD-10-CM

## 2018-12-24 DIAGNOSIS — N8311 Corpus luteum cyst of right ovary: Secondary | ICD-10-CM

## 2018-12-24 NOTE — Patient Instructions (Signed)
[facility]  MJ Tucci [phone]   Please accept our heartfelt condolences and call us if you need an ear to listen.  Web Resources: www.[email or web address] www.[email or web address].org [email or web address] This site has an excellent pamphlet that you can download on early pregnancy loss.  Books: Miscarriage: Women Sharing from the Heart by Marie Allen and Shelly Marks. Published by [employer]. Summarizes reactions of 100 women who experience miscarriage.  Molly's [place] by Janice Cohn. Published by [employer]. Children's book on miscarriage.  Miscarriage: A Man's Book by Rick Wheat. Published by [employer] at [phone]. Written to help men understand reactions of both parents to miscarriage.  Miscarriage: A Shattered Dream by Sherokee Ilse and Linda Hammer Burns.  I Never Held You: A book about miscarriage, healing and recovery by Ellen M. DuBois.   MISCARRIAGE OR NEONATAL LOSS.a word to parents By Gretchen Gross, MSW  For those who experience a miscarriage or fetal loss, the world has turned upside down. Regardless of whether the pregnancy was planned and wanted, or unplanned and the parents were ambivalent, there are feelings of sadness, grief and a loss of equilibrium which effects both parents. Though we allow for these feelings when other deaths occur, we are remiss in acknowledging and allowing parents to grieve their pregnancy loss or miscarriage. We wrongly equate the depth of the loss to the size of the casket.  With pregnancy loss or death before birth, there is little guidance or expectation of what is normal grief. Others, who minimize or misunderstand the loss, may expect that you "get back to normal" quickly. With pressures to overlook the impact of the loss, healthy grieving processes can get stunted, overlooked, or stopped all together. I hope that this handout will  allow you to acknowledge and express whatever loss you feel, to share with others your sorrow and will encourage you and others to take a moment to understand and experience the depth of your emotions at this time.  A baby represents so much. It may represent hope, immortality, fulfillment of dreams, or an outward sign of a loving relationship. At the same time, pregnancy may bring on anxiety, fear, and an increase in commitment and responsibility. Understandably, few pregnant parents experience only one or the other of these emotions. Despite the type of emotional response to the pregnancy, as the process continues, what is central to almost all pregnancies is that over time, the bond between parent and child grows. Each day brings an expansion of the world. What was once routine now becomes more important. Diet, lifestyle, connection to family, time commitments, relationships with friends, spouse, others.nothing is as it was. These are the normal changes that a pregnant woman and her partner experience. When a baby dies, nothing that made sense before makes any sense after. We are left emotionally raw. Often our bodies respond in ways that assure us that we are crazy, but we are not. We are grieving a life that we never fully knew, a relationship that was too short. A part of us has died Too.  Mothers and fathers become more attached to a pregnancy as each day, week and month go by. Mothers have physical contact with the pregnancy, daily reminders of the growth and change occurring. They connect by talking to their bellies, by wondering what life will be like this time next year, look for clothes, cribs, pediatricians and more. Fathers often become attached in different ways: considering buying a new car or safe car   seat, working more to earn a little more money before the baby arrives, or by reconsidering the family's financial status. However bonding occurs, it generally increases as  the baby grows. For family members, co-workers and friends, the pregnancy may be less real. They may only know of the pregnancy from a physical or emotional distance. Since many of us are so uncomfortable with death and we work hard to deny it. With a prenatal loss, the lack of contact with the unborn baby may encourage others to minimize the loss, or even suggest that it was something to feel thankful for at this time suggesting that the pain felt now would be significantly less than at any other time in relationship to this child.  That is why it feels so crazy to be in such pain when others might not understand. You might feel that you are still standing at an emotional graveside and the world wonders "when you'll be ready to go back to work" or "why you still cry. It's been four months already." Be assured that it is the rest of the world that is, in this case, acting crazy. When a living child or adult dies, we have understandings, behaviors, rituals that though painful, enable us to feel cared for, comforted, acknowledged in our loss, and encourage healing. Unfortunately, too little of this is the case when there is a miscarriage or neonatal loss.  There are many difficult dilemmas that you might face in the wake of the loss. Do you have a funeral? How do you tell people? Do you publish an obituary? What do you do with the remains, both physical and emotional? I encourage people to find a formalized way to acknowledge the loss of the pregnancy and to honor the relationship that did and does still exist and will for the rest of your life. It is your choice as to whether this happens in a funeral service, in a memorial service, in a letter to the child who you did not meet or in some manner that makes sense to you religiously, spiritually or emotionally. It is not silly to ask friends or family to participate nor is it improper to keep this intensely personal and private. Grief, however,  needs to be a public process. We must grieve among others and with our loved ones. We do not all need to feel the same amount of pain in the loss, but we need to act as a loving community to support those most hurt at this time. Healthy grieving involves a strengthening of the bonds with others who we love and who love us. Some people ask a close friend or relative to contact others and inform them of the loss to minimize the telling of the story. Others find relief in telling friends, family and colleagues about the loss, in that it reminds us that the pregnancy was real and that this pain is real. Where one connection is broken, another is reinforced and strengthened. Grieving in isolation can be detrimental. By inviting important others to a funeral, tree planting, or sunset service and by taking them up on an offer to help will help your healing.  Some people have described aspects of grief after a pregnancy loss that are quite different from those present after the loss of an adult relative or friend. Some things that might seem "weird" or "abnormal" are indeed, quite normal following this kind of loss. G.W. Davidson described her experience in the book "Understanding: Death of the Wished-for Child." "  I kept searching for something. I wasn't sure what it was. All of a sudden one day in the kitchen, I spontaneously got out my kitchen scales and started weighing fruits and vegetables. I realized I was trying to find something that had the identical weight that the baby did.I found myself weighing my rolling pin.it happened to be the identical length and weight that the baby was." This describes the process of building memories. With miscarriage and neonatal loss, there are too few memories of the pregnancy or the baby itself. Parents may not ever be able to see or hold a baby in these cases, especially in the earlier trimesters, so saving mementos and gathering and providing information  are especially important. G.W. Davidson described a parent who needs to see and feel something that had similar dimensions as the child. This is profoundly different than the grieving that happens when a 27 year old person dies, when we have years of memories, concrete possessions, photos and history. Though it is a different aspect of grief, it is normal, expected and healthy. Your grief will not make you crazy.  Also, do not be frightened if you feel this loss in a very physical manner. Your arms or chest may ache, convincing you that your heart has broken. Women have described this as a feeling of "empty arms". They also describe a similar feeling of an emptiness in their bellies, which was previously full. Some women also swear that they still feel the baby moving in their bellies. Many people report being woken to the sound of a crying baby, having vivid dreams about babies, trying to find their lost babies, or graphic dreams of injuries happening to themselves or others. Breasts may still feel as if they are filling and it may seem as if the body is unaware of the loss. These things can feel quite confusing. If you experience any of these responses, please tell someone about them. They are not a sign of insanity, they are aspects of grief. Keeping them to yourself increases the feelings of isolation and disequilibrium. Be reassured that these are normal experiences after the loss of a pregnancy.   A WORD ABOUT DIFFERENCES BETWEEN MEN'S AND WOMEN'S GRIEF  If most people do not fully understand the pain of a mother's grief through pregnancy loss, there is an even greater denial on society's part that the father is in pain. I have seen men in great pain and despair after the loss of a pregnancy and they say that nobody has ever asked them about their loss. People may only ask about their wife/partner's pain. To put it simply, one father, head bowed, shoulders heaving as he cried said  "if one more person asks me how Jane is, I'll fly apart. Don't they know I lost a child too? Don't they see the circles under my eyes, my face, my pain? Don't I count?" Couples often move through the grief process in different ways and at different times. Do not assume that your partner is better because they are wanting to take in a movie or go out to eat. This may just be a diversion, a need for a change of scenery, and a yearning for normalcy. Women are usually more comfortable crying and men find safety in action. For this reason, friction can develop between partners, when one wants the other to start behaving like they did before the loss or to cry along with them. Respect one another's process. Talk to your partner about   the specifics. If asked, "How are you today?" many people will just say "fine". Specifically relate that you have had a hard time with the holiday, or seeing a friend's new baby, and ask your partner how they felt in that moment. Sex is very often a very difficult prospect after a miscarriage or loss. It might remind a person of the conception, raise fears of another pregnancy and future children, or seem like too much pleasure to experience when one is otherwise in pain. Many men reconnect with their partner by being sexual and feel increasingly isolated when a partner does not respond. Talk. There may be a comfortable middle ground with each other. If the general patterns of the relationship shift toward uncomfortable dynamics, such as decreased communication, blaming one another, or increased absences from the home, it is important to seek counseling with a professional. This is not an easy time, you have had a loss. Be patient with yourself. Seek help and support from loved ones, friends, professionals. The grief will evolve with time and you will not always be in such immediate or raw pain. Be assured that you may never forget your pregnancy or your baby. Healing  takes time. I have compiled a list of books on grief and death that might be useful.  *Anna: A Daughter's Life. William Loizeaux, [employer], 1993. A father's account of his grief at the loss of his infant daughter. *Explaining Death to Children. Earl Grollman, [employer], 1967. A valuable guide to help adults/parents explain death to children. *When Pregnancy Fails: Families Coping with Miscarriage, Ectopic Pregnancy, Stillbirth, and Infant Death. Susan Borg and Judith Lasker, [employer], 1989. A good resource to understand pregnancy loss, including some information on support for the family. *Hour of Gold, Hour of Lead: Diaries and Letters of Anne Morrow Lindbergh. Harcourt Br[employer] Jovanovich, 1973. Anne Lindbergh writes of the pain and loss in the wake of their young child's abduction. *A Child Dies: A Portrait of Family Grief. Joan Hagan Arnold and [name] Bushman Gemma, the [employer], 1995. Dealing with death of unborn children, infants, and children of all ages. Revised by Nancy J. McClellan, CNM, MS November, 2008    COPING WITH A MISCARRIAGE Susan Donnis, R.N., M.A.T.  You have had a miscarriage. You have lost a pregnancy. This is undoubtedly a difficult and sad time for you. Your loss may be hard to believe and it is frustrating to know you are helpless to change anything. "This can't possibly be happening to me. Why me? Why not someone else" I promise I'll be more careful if I can only have my baby back." These are common reactions to experiencing a miscarriage. Feelings of disbelief and anger, helplessness and guilt, depression and perhaps failure, are real and understandable. You may tell yourself that the guilt you feel is irrational, unreasonable or inappropriate, but it is there. "What did I do to cause this? Why wasn't I more careful? If only I had known. Is this a punishment for something I did?" You may find yourself recounting the days or weeks  before your miscarriage, searching for clues that you feel sure you must have missed; searching for valid reasons for how or why this happened. Having something "taken away" that you cherish feels shocking and unbelievable. You ask your doctor or midwife for an explanation; friends volunteer their opinions. In many cases, your questions of "how" or "why" are not satisfactorily answered. You may have some of the above thoughts or feelings and they   may be present in different combinations, differing degrees, and in some confusion. They are understandable and part of the process of beginning to cope with a significant loss. With any death, it is healthy and essential to allow yourself to experience grief whether you are a man or woman. The grieving process is not something begun and completed in a day, a week or a month. It takes time, understanding, support and acceptance. In the case of a miscarriage, this process is often more difficult because the death is not publicly acknowledged. There is no funeral to arrange and attend, no outward recognition of a loss. This may tend to increase feelings of isolation and loneliness. The term miscarriage is used here instead of the more medically correct term spontaneous abortion to avoid confusion with elective abortion. Some couples feel they must keep their grief invisible and "get over this quickly." Most employers will freely give time off to attend a family funeral, but many do not understand the reasonable and justified request for time off after a miscarriage. Loss of a longed-for pregnancy, regardless of how early, is a significant bereavement. Men and women may react to the miscarriage differently. Men may feel more helpless and frustrated than their wives because they often assume a passive-observer role. Waiting and watching can be harder than actively participating, regardless of the outcome. Men do experience a miscarriage in spite of not  actively participating in the physical loss. People have different ways of coping with a loss and you need to choose what is most helpful to you. Too often "being strong" is looked upon as a healthy way of coping, when, in fact, repressing and ignoring very real feelings is not helpful and contributes to serious coping problems. Some feelings and questions that men and women express and ask are:  It hangs over me like a black cloud. Though I've tried, I honestly don't know how to work this through. How do I say goodbye?  I feel extremely guilty. I guess I really didn't want this pregnancy. I feel relieved that it's over, but so guilty because I'm relieved. This isn't right.  I'm fine. Life must go on and I'm a strong person. There's really nothing to grieve for anyway, is there?  I feel pressure not to be sad. So I tend to cover up a lot.  How do I respond to well meaning but frustrating platitudes like, "Try again soon, dear. Another pregnancy will help." I would like to be pregnant again, but I believe a pregnancy should happen at a time of strength, not sadness.  How do I respond to silence and awkward attempts to avoid the whole issue? (You may be the one that decides to bring up the topic. Friends, relatives and acquaintances often do not know how to respond, so they say nothing).  I'm fine until my period comes. It is such a start reminder of the part of me that is lost.  My husband/wife and I always enjoyed a good sex life. Since my miscarriage, I have a hard time allowing myself to feel loving and sensuous. After all if such a loving and pleasurable experience as intercourse started something that ended in such a tragedy, it's too scary to think it might happen again. Why doesn't my husband/wife understand?  I have to face reality. Should I just force myself to attend my friend's baby shower?  Everyone was so supportive and caring when I was in the hospital. That was four months  ago.   I can't burden others with my sadness now. Why am I still sad? I should be over this thing by now. Knowing common facts about miscarriage probably will not blot out your negative feelings - denial, anger, disappointment, sadness - but facts can hold some comfort and can help cushion the sadness and guilt. For those wanting a child, it is frustrating and intensely disappointing to experience any miscarriage. However, most pregnancies that end in the first three months are imperfect in some way and, regardless of any precaution or intervention, are incapable of surviving and growing beyond approximately 14 weeks. This fact may not ease your sense of emptiness and sadness, but it may help ease your guilt and sense of assumed responsibility. Knowing clearly that you did not cause this to happen, that you are not directly responsible of your miscarriage by what you did or did not do, can bring a sense of relief and relaxation to your anxieties or guilt. A pregnancy that ends in later months is uniquely difficult also. Your protruding abdomen, your child's movements and heartbeat, are concrete proof of your expectant parenthood. A miscarriage occurring at 6 weeks or at 6 months causes a sudden change of events and feelings. Understandably, you may feel robbed or cheated of a promise of what was to be. It's easy to think that everyone else can have children. Surely I've been tricked or badly treated, you think. A fact not commonly know is that at least one in five pregnancies end in miscarriage, which indicates you are not alone. In a room of 25 couples, the odds are that 4 have experienced a miscarriage. Of these 8 people, many have shared your experience and feelings. Of course, this is not an easy topic to talk about, therefore it can appear that miscarriages rarely happen and that you are the only one coping with this loss. Realizing you are note alone can perhaps ease some of the shock and  disbelief of "WHY Me?"  Three actions will help: 1. Giver yourself permission to be sad. It is possible to be sad without becoming chronically depressed. Set limits for yourself if you are concerned about becoming overly depressed. If the sadness begins to wash over you while at work at 2 p.m., tell yourself that you cannot deal with it then, but make an agreement with yourself that at 8 p.m. that night you will. And then do it. Many individuals report a beginning sense of control over their grief when they realize they can create appropriate and private times to be sad, alone, or with a significant other person. If you think you may be severely depressed (i.e. having difficulty getting out of bed in the morning, withdrawal from friends and relatives, insomnia, extended loss of appetite or overeating, loss of the ability to enjoy anything), this problem is probably not something you can handle yourself. It is important to seek out professional help at this time, or at any time when you are feeling particularly discouraged about your Coping.  2. Find a supportive, trusted person and talk about your feelings and your questions. Build a support system, whether it be your spouse, partner, friend, relative, colleague, clergyman, support group or professional counselor. You may still feel some pangs of sadness when attending a baby shower, celebrating a holiday, seeing other pregnant couples or passing the date of your expected delivery, but once you work through your loss, coping with your situation will become easier.  3. Also part of the process of life   itself, is understanding and accepting incongruent or seemingly contradictory feelings. As one insightful women expressed, "Being happy for a friend who has just had a baby (or twins!) is a genuine feeling, but also resentment, anger and frustration are there, all wrapped into one package;" two apparently conflicting feelings, but both can  exist, be recognized, and accepted.  A miscarriage is an unexpected, often unexplained, death; with any loss you need to give yourself permission to grieve, to be angry, sad or relieved. There is no one appropriate reaction. Acknowledging your feelings that are real and understandable, though they may not be logical or rational, is part of the grieving process. Allowing the grieving process to happen is a positive way of coping and leads to health resolve and strength to look ahead. There may be times when you may not be able to put words to your feelings, but that does not need to stop you from seeking out and talking with an understanding person. Building your own support system will help you regain confidence and strength within yourself.  RECOMMENDED READING The following materials are all available through [employer], [address], [place] [address]; phone [phone]. For Adults: Empty Arms by Sherokee Ilse Empty Cradle, Broken Heart by Deborah Davis Ended Beginnings by C. Panuthos & C. Romeo Miscarriage a [employer] Resource Miscarriage - A Shattered Dream by Sherokee Ilse Newborn Death a [employer] Resource Still To Be Born by Perinatal Loss When a Baby Dies by M.J. Church et al (TCF) When Hello Means Goodbye by P. Schwiebert and P. Kirk When Pregnancy Fails by S. Borg & J. Lasker For Helping Other Children How Do We Tell the Children? by Shaefer & Lyons No New Baby by Marilyn Gryte Talking About Death by Earl Grollman Where's Jess?? A [employer] Resource Other helpful materials available locally at bookstores: The Fall of Freddy the Leaf by Leo Buscaglia, PhD, [employer] (for children) When Goodbye is Forever - Learning to Live Again After the Loss of a Child by John Bramblett, [employer]   

## 2018-12-24 NOTE — Progress Notes (Signed)
ROB/US Pt visibly upset Pt stated "im just sad, it just looked empty" she is complaining of extreme nausea and vomiting, and headaches

## 2018-12-24 NOTE — Progress Notes (Signed)
Patient ID: [name], female   DOB: [birth year], 28 y.o.   MRN: [id number]  Reason for Consult: Routine Prenatal Visit   Referred by Farrel Conners, CNM  Subjective:     HPI:  Natalie Welch is a 28 y.o. female . She is following up today for a viability scan. She has not had pain or bleeding. She has been experiencing significant nausea.   Past Medical History:  Diagnosis Date  . No known health problems    No family history on file. Past Surgical History:  Procedure Laterality Date  . NO PAST SURGERIES      Short Social History:  Social History   Tobacco Use  . Smoking status: Never Smoker  . Smokeless tobacco: Never Used  Substance Use Topics  . Alcohol use: Yes    Comment: OCc    Allergies  Allergen Reactions  . Abilify [Aripiprazole]   . Nuvigil [Armodafinil] Other (See Comments)    Tongue and throat swelling    Current Outpatient Medications  Medication Sig Dispense Refill  . metoCLOPramide (REGLAN) 10 MG tablet Take 1 tablet (10 mg total) by mouth every 8 (eight) hours as needed for nausea. 30 tablet 0  . promethazine (PHENERGAN) 12.5 MG tablet Take 1 tablet (12.5 mg total) by mouth every 6 (six) hours as needed for nausea or vomiting. 30 tablet 1   No current facility-administered medications for this visit.     Review of Systems  Constitutional: Negative for chills, fatigue, fever and unexpected weight change.  HENT: Negative for trouble swallowing.  Eyes: Negative for loss of vision.  Respiratory: Negative for cough, shortness of breath and wheezing.  Cardiovascular: Negative for chest pain, leg swelling, palpitations and syncope.  GI: Negative for abdominal pain, blood in stool, diarrhea, nausea and vomiting.  GU: Negative for difficulty urinating, dysuria, frequency and hematuria.  Musculoskeletal: Negative for back pain, leg pain and joint pain.  Skin: Negative for rash.  Neurological: Negative for dizziness, headaches,  light-headedness, numbness and seizures.  Psychiatric: Negative for behavioral problem, confusion, depressed mood and sleep disturbance.        Objective:  Objective   Vitals:   12/24/18 0948  Weight: 136 lb (61.7 kg)   Body mass index is 26.56 kg/m.  Physical Exam Vitals signs and nursing note reviewed.  Constitutional:      Appearance: She is well-developed.  HENT:     Head: Normocephalic and atraumatic.  Eyes:     Pupils: Pupils are equal, round, and reactive to light.  Cardiovascular:     Rate and Rhythm: Normal rate and regular rhythm.  Pulmonary:     Effort: Pulmonary effort is normal. No respiratory distress.  Skin:    General: Skin is warm and dry.  Neurological:     Mental Status: She is alert and oriented to person, place, and time.  Psychiatric:        Behavior: Behavior normal.        Thought Content: Thought content normal.        Judgment: Judgment normal.         Assessment/Plan:    28 yo G1P0000 with a missed abortion [place] today is consistent with pregnancy failure and missed abortion. Discussed with patient the options for expectant management, medical management and surgical managmenet. After a detailed discussion of these options the patient elected for surgical management. She would like to schedule this for next Thursday. Surgical consent and Blood transfusion consents signed today in office.  Patient is Rh +.  Scheduled for a D&C with Stae[facility] on 12/30/2018. Condolences offered to the patient and her family.  More than 25 minutes were spent face to face with the patient in the room with more than 50% of the time spent providing counseling and discussing the plan of management.   Adelene Idler MD [facility], [facility] 12/24/2018 10:57 AM

## 2018-12-27 ENCOUNTER — Telehealth: Payer: Self-pay

## 2018-12-27 ENCOUNTER — Telehealth: Payer: Self-pay | Admitting: Obstetrics and Gynecology

## 2018-12-27 NOTE — Telephone Encounter (Signed)
-----   Message from Natale Milch, MD sent at 12/24/2018 10:48 AM EST ----- Regarding: surgery Surgery Booking Request Patient Full Name:  Natalie Welch  MRN: [id number]  DOB: 06/18/1991  Surgeon: Natale Milch, MD  Requested Surgery Date and Time: 12/30/2018 Primary Diagnosis AND Code: missed abortion Secondary Diagnosis and Code:  Surgical Procedure: D&E L&D Notification: No Admission Status: same day surgery Length of Surgery: 30 min Special Case Needs: none H&P: 12/24/2018 (date) Phone Interview???: yes Interpreter: Language:  Medical Clearance: no Special Scheduling Instructions: schedule for staebler, her okay'd

## 2018-12-27 NOTE — Telephone Encounter (Signed)
Patient is aware of Pre-admit Testing phone interview on 12/29/18 9am-1pm, and OR on 12/30/18. Patient is aware she may receive calls from the [facility] and [facility]. Patient confirmed BCBS and no secondary insurance. Patient said she called earlier requesting a note, but no longer needs the note.

## 2018-12-27 NOTE — Telephone Encounter (Signed)
Pt needs note excusing her from [employer] on Thursday as she is scheduled for [facility].  [phone]

## 2018-12-29 ENCOUNTER — Encounter
Admission: RE | Admit: 2018-12-29 | Discharge: 2018-12-29 | Disposition: A | Discharge status: Home / Self Care | Payer: BLUE CROSS/BLUE SHIELD | Source: Ambulatory Visit | Attending: Obstetrics and Gynecology | Admitting: Obstetrics and Gynecology

## 2018-12-29 ENCOUNTER — Other Ambulatory Visit: Payer: Self-pay

## 2018-12-29 HISTORY — DX: Adverse effect of unspecified anesthetic, initial encounter: T41.45XA

## 2018-12-29 HISTORY — DX: Other complications of anesthesia, initial encounter: T88.59XA

## 2018-12-29 HISTORY — DX: Gastro-esophageal reflux disease without esophagitis: K21.9

## 2018-12-29 HISTORY — DX: Anxiety disorder, unspecified: F41.9

## 2018-12-29 HISTORY — DX: Other specified postprocedural states: Z98.890

## 2018-12-29 HISTORY — DX: Nausea with vomiting, unspecified: R11.2

## 2018-12-29 MED ORDER — DOXYCYCLINE HYCLATE 100 MG IV SOLR
200.0000 mg | INTRAVENOUS | Status: AC
  Administered 2018-12-30: 200 mg via INTRAVENOUS
  Filled 2018-12-29: qty 200

## 2018-12-29 NOTE — Patient Instructions (Signed)
Your procedure is scheduled on: 12-30-18  Report to Same Day Surgery 2nd floor medical mall [facility] Entrance-take elevator on left to 2nd floor.  Check in with surgery information desk.) To find out your arrival time please call [phone] between 1PM - 3PM on 12-29-18   Remember: Instructions that are not followed completely may result in serious medical risk, up to and including death, or upon the discretion of your surgeon and anesthesiologist your surgery may need to be rescheduled.    _x___ 1. Do not eat food after midnight the night before your procedure. You may drink clear liquids up to 2 hours before you are scheduled to arrive at the hospital for your procedure.  Do not drink clear liquids within 2 hours of your scheduled arrival to the hospital.  Clear liquids include  --Water or Apple juice without pulp  --Clear carbohydrate beverage such as ClearFast or Gatorade  --Black Coffee or Clear Tea (No milk, no creamers, do not add anything to the coffee or Tea   ____Ensure clear carbohydrate drink on the way to the hospital for bariatric patients  ____Ensure clear carbohydrate drink 3 hours before surgery for Dr Rutherford Nail patients if physician instructed.   No gum chewing or hard candies.     __x__ 2. No Alcohol for 24 hours before or after surgery.   __x__3. No Smoking or e-cigarettes for 24 prior to surgery.  Do not use any chewable tobacco products for at least 6 hour prior to surgery   ____  4. Bring all medications with you on the day of surgery if instructed.    __x__ 5. Notify your doctor if there is any change in your medical condition     (cold, fever, infections).    x___6. On the morning of surgery brush your teeth with toothpaste and water.  You may rinse your mouth with mouth wash if you wish.  Do not swallow any toothpaste or mouthwash.   Do not wear jewelry, make-up, hairpins, clips or nail polish.  Do not wear lotions, powders, or perfumes. You may wear  deodorant.  Do not shave 48 hours prior to surgery. Men may shave face and neck.  Do not bring valuables to the hospital.    [facility] is not responsible for any belongings or valuables.               Contacts, dentures or bridgework may not be worn into surgery.  Leave your suitcase in the car. After surgery it may be brought to your room.  For patients admitted to the hospital, discharge time is determined by your treatment team.  _  Patients discharged the day of surgery will not be allowed to drive home.  You will need someone to drive you home and stay with you the night of your procedure.    Please read over the following fact sheets that you were given:   [facility] Preparing for Surgery   _x___ TAKE THE FOLLOWING MEDICATION THE MORNING OF SURGERY WITH A SMALL SIP OF WATER. These include:  1. PROMETHAZINE  2.  3.  4.  5.  6.  ____Fleets enema or Magnesium Citrate as directed.   ____ Use CHG Soap or sage wipes as directed on instruction sheet   ____ Use inhalers on the day of surgery and bring to hospital day of surgery  ____ Stop Metformin and Janumet 2 days prior to surgery.    ____ Take 1/2 of usual insulin dose the night  before surgery and none on the morning surgery.   ____ Follow recommendations from Cardiologist, Pulmonologist or PCP regarding  stopping Aspirin, Coumadin, Plavix ,Eliquis, Effient, or Pradaxa, and Pletal.  X____Stop Anti-inflammatories such as Advil, Aleve, Ibuprofen, Motrin, Naproxen, Naprosyn, Goodies powders or aspirin products NOW-OK to take Tylenol    ____ Stop supplements until after surgery.     ____ Bring C-Pap to the hospital.

## 2018-12-29 NOTE — Pre-Procedure Instructions (Signed)
Orders were placed by surgeon after phone interview. Ensure drink was ordered. I messaged Harriett Sine to see if Dr Bonney Aid wanted pt to come in for drink since she working. Harriett Sine from [facility] office said that Dr Bonney Aid did not need pt to be called back to come in for drink.

## 2018-12-30 ENCOUNTER — Encounter: Payer: Self-pay | Admitting: *Deleted

## 2018-12-30 ENCOUNTER — Encounter
Admission: RE | Disposition: A | Discharge status: Home / Self Care | Payer: Self-pay | Source: Home / Self Care | Attending: Obstetrics and Gynecology

## 2018-12-30 ENCOUNTER — Ambulatory Visit
Admission: RE | Admit: 2018-12-30 | Discharge: 2018-12-30 | Disposition: A | Discharge status: Home / Self Care | Payer: BLUE CROSS/BLUE SHIELD | Attending: Obstetrics and Gynecology | Admitting: Obstetrics and Gynecology

## 2018-12-30 ENCOUNTER — Ambulatory Visit: Payer: BLUE CROSS/BLUE SHIELD | Admitting: Certified Registered Nurse Anesthetist

## 2018-12-30 DIAGNOSIS — O019 Hydatidiform mole, unspecified: Secondary | ICD-10-CM | POA: Diagnosis not present

## 2018-12-30 DIAGNOSIS — O020 Blighted ovum and nonhydatidiform mole: Secondary | ICD-10-CM | POA: Diagnosis not present

## 2018-12-30 DIAGNOSIS — O021 Missed abortion: Secondary | ICD-10-CM | POA: Diagnosis not present

## 2018-12-30 DIAGNOSIS — Z9889 Other specified postprocedural states: Secondary | ICD-10-CM

## 2018-12-30 HISTORY — PX: DILATION AND EVACUATION: SHX1459

## 2018-12-30 LAB — TYPE AND SCREEN
ABO/RH(D): O POS
Antibody Screen: NEGATIVE

## 2018-12-30 LAB — ABO/RH: ABO/RH(D): O POS

## 2018-12-30 SURGERY — DILATION AND EVACUATION, UTERUS
Anesthesia: General

## 2018-12-30 MED ORDER — FENTANYL CITRATE (PF) 100 MCG/2ML IJ SOLN
INTRAMUSCULAR | Status: AC
  Filled 2018-12-30: qty 2

## 2018-12-30 MED ORDER — FAMOTIDINE 20 MG PO TABS
20.0000 mg | ORAL_TABLET | Freq: Once | ORAL | Status: AC
  Administered 2018-12-30: 20 mg via ORAL

## 2018-12-30 MED ORDER — LIDOCAINE HCL (CARDIAC) PF 100 MG/5ML IV SOSY
PREFILLED_SYRINGE | INTRAVENOUS | Status: DC | PRN
  Administered 2018-12-30: 100 mg via INTRAVENOUS

## 2018-12-30 MED ORDER — IBUPROFEN 600 MG PO TABS: 600.0000 mg | ORAL_TABLET | Freq: Four times a day (QID) | ORAL | 3 refills | Status: DC | PRN

## 2018-12-30 MED ORDER — ONDANSETRON HCL 4 MG/2ML IJ SOLN
INTRAMUSCULAR | Status: DC | PRN
  Administered 2018-12-30: 4 mg via INTRAVENOUS

## 2018-12-30 MED ORDER — PROPOFOL 500 MG/50ML IV EMUL: INTRAVENOUS | Status: DC | PRN

## 2018-12-30 MED ORDER — MIDAZOLAM HCL 2 MG/2ML IJ SOLN
INTRAMUSCULAR | Status: DC | PRN
  Administered 2018-12-30: 2 mg via INTRAVENOUS

## 2018-12-30 MED ORDER — LIDOCAINE HCL (PF) 2 % IJ SOLN
INTRAMUSCULAR | Status: AC
  Filled 2018-12-30: qty 10

## 2018-12-30 MED ORDER — MEPERIDINE HCL 50 MG/ML IJ SOLN: 6.2500 mg | INTRAMUSCULAR | Status: DC | PRN

## 2018-12-30 MED ORDER — SCOPOLAMINE 1 MG/3DAYS TD PT72
1.0000 | MEDICATED_PATCH | TRANSDERMAL | Status: DC
  Administered 2018-12-30: 1.5 mg via TRANSDERMAL

## 2018-12-30 MED ORDER — DEXAMETHASONE SODIUM PHOSPHATE 4 MG/ML IJ SOLN
INTRAMUSCULAR | Status: AC
  Filled 2018-12-30: qty 2

## 2018-12-30 MED ORDER — DEXMEDETOMIDINE HCL 200 MCG/2ML IV SOLN
INTRAVENOUS | Status: DC | PRN
  Administered 2018-12-30: 12 ug via INTRAVENOUS
  Administered 2018-12-30: 8 ug via INTRAVENOUS
  Administered 2018-12-30: 12 ug via INTRAVENOUS
  Administered 2018-12-30: 8 ug via INTRAVENOUS

## 2018-12-30 MED ORDER — FAMOTIDINE 20 MG PO TABS
ORAL_TABLET | ORAL | Status: AC
  Administered 2018-12-30: 20 mg via ORAL
  Filled 2018-12-30: qty 1

## 2018-12-30 MED ORDER — HYDROCODONE-ACETAMINOPHEN 5-325 MG PO TABS: 1.0000 | ORAL_TABLET | Freq: Four times a day (QID) | ORAL | 0 refills | Status: DC | PRN

## 2018-12-30 MED ORDER — SCOPOLAMINE 1 MG/3DAYS TD PT72
MEDICATED_PATCH | TRANSDERMAL | Status: AC
  Filled 2018-12-30: qty 1

## 2018-12-30 MED ORDER — MIDAZOLAM HCL 2 MG/2ML IJ SOLN
INTRAMUSCULAR | Status: AC
  Filled 2018-12-30: qty 2

## 2018-12-30 MED ORDER — PROMETHAZINE HCL 25 MG/ML IJ SOLN: 6.2500 mg | INTRAMUSCULAR | Status: DC | PRN

## 2018-12-30 MED ORDER — PROPOFOL 500 MG/50ML IV EMUL
INTRAVENOUS | Status: AC
  Filled 2018-12-30: qty 50

## 2018-12-30 MED ORDER — ONDANSETRON HCL 4 MG/2ML IJ SOLN
INTRAMUSCULAR | Status: AC
  Filled 2018-12-30: qty 2

## 2018-12-30 MED ORDER — LACTATED RINGERS IV SOLN
INTRAVENOUS | Status: DC
  Administered 2018-12-30: 10:00:00 via INTRAVENOUS

## 2018-12-30 MED ORDER — OXYCODONE HCL 5 MG PO TABS: 5.0000 mg | ORAL_TABLET | Freq: Once | ORAL | Status: DC | PRN

## 2018-12-30 MED ORDER — PROPOFOL 10 MG/ML IV BOLUS
INTRAVENOUS | Status: DC | PRN
  Administered 2018-12-30: 70 mg via INTRAVENOUS
  Administered 2018-12-30: 130 mg via INTRAVENOUS

## 2018-12-30 MED ORDER — PROPOFOL 500 MG/50ML IV EMUL
INTRAVENOUS | Status: DC | PRN
  Administered 2018-12-30: 150 ug/kg/min via INTRAVENOUS

## 2018-12-30 MED ORDER — FENTANYL CITRATE (PF) 100 MCG/2ML IJ SOLN: 25.0000 ug | INTRAMUSCULAR | Status: DC | PRN

## 2018-12-30 MED ORDER — DEXMEDETOMIDINE HCL IN NACL 80 MCG/20ML IV SOLN
INTRAVENOUS | Status: AC
  Filled 2018-12-30: qty 20

## 2018-12-30 MED ORDER — FENTANYL CITRATE (PF) 100 MCG/2ML IJ SOLN
INTRAMUSCULAR | Status: DC | PRN
  Administered 2018-12-30 (×2): 50 ug via INTRAVENOUS

## 2018-12-30 MED ORDER — DEXAMETHASONE SODIUM PHOSPHATE 10 MG/ML IJ SOLN
INTRAMUSCULAR | Status: DC | PRN
  Administered 2018-12-30: 8 mg via INTRAVENOUS

## 2018-12-30 MED ORDER — KETOROLAC TROMETHAMINE 30 MG/ML IJ SOLN
INTRAMUSCULAR | Status: DC | PRN
  Administered 2018-12-30: 30 mg via INTRAVENOUS

## 2018-12-30 MED ORDER — OXYCODONE HCL 5 MG/5ML PO SOLN: 5.0000 mg | Freq: Once | ORAL | Status: DC | PRN

## 2018-12-30 MED ORDER — KETOROLAC TROMETHAMINE 30 MG/ML IJ SOLN
INTRAMUSCULAR | Status: AC
  Filled 2018-12-30: qty 1

## 2018-12-30 SURGICAL SUPPLY — 19 items
CATH ROBINSON RED A/P 16FR (CATHETERS) ×2 IMPLANT
COVER WAND RF STERILE (DRAPES) ×2 IMPLANT
FILTER UTR ASPR SPEC (MISCELLANEOUS) ×1 IMPLANT
FLTR UTR ASPR SPEC (MISCELLANEOUS) ×2
GLOVE BIO SURGEON STRL SZ7 (GLOVE) ×4 IMPLANT
GOWN STRL REUS W/ TWL LRG LVL3 (GOWN DISPOSABLE) ×2 IMPLANT
GOWN STRL REUS W/TWL LRG LVL3 (GOWN DISPOSABLE) ×2
KIT BERKELEY 1ST TRIMESTER 3/8 (MISCELLANEOUS) ×2 IMPLANT
KIT TURNOVER CYSTO (KITS) ×2 IMPLANT
NS IRRIG 500ML POUR BTL (IV SOLUTION) ×2 IMPLANT
PACK DNC HYST (MISCELLANEOUS) ×2 IMPLANT
PAD OB MATERNITY 4.3X12.25 (PERSONAL CARE ITEMS) ×2 IMPLANT
PAD PREP 24X41 OB/GYN DISP (PERSONAL CARE ITEMS) ×2 IMPLANT
SET BERKELEY SUCTION TUBING (SUCTIONS) ×2 IMPLANT
TOWEL OR 17X26 4PK STRL BLUE (TOWEL DISPOSABLE) ×2 IMPLANT
VACURETTE 10 RIGID CVD (CANNULA) IMPLANT
VACURETTE 12 RIGID CVD (CANNULA) IMPLANT
VACURETTE 8 RIGID CVD (CANNULA) IMPLANT
VACURETTE 8MM F TIP (MISCELLANEOUS) ×2 IMPLANT

## 2018-12-30 NOTE — Discharge Instructions (Signed)

## 2018-12-30 NOTE — Anesthesia Post-op Follow-up Note (Signed)
Anesthesia QCDR form completed.        

## 2018-12-30 NOTE — Anesthesia Preprocedure Evaluation (Signed)
Anesthesia Evaluation  Patient identified by MRN, date of birth, ID band Patient awake    Reviewed: Allergy & Precautions, NPO status , Patient's Chart, lab work & pertinent test results  History of Anesthesia Complications (+) PONV and history of anesthetic complications  Airway Mallampati: II  TM Distance: >3 FB Neck ROM: Full    Dental no notable dental hx.    Pulmonary neg pulmonary ROS, neg sleep apnea, neg COPD,    breath sounds clear to auscultation- rhonchi (-) wheezing      Cardiovascular Exercise Tolerance: Good (-) hypertension(-) CAD, (-) Past MI, (-) Cardiac Stents and (-) CABG  Rhythm:Regular Rate:Normal - Systolic murmurs and - Diastolic murmurs    Neuro/Psych neg Seizures Anxiety negative neurological ROS     GI/Hepatic Neg liver ROS, GERD  ,  Endo/Other  negative endocrine ROSneg diabetes  Renal/GU negative Renal ROS     Musculoskeletal negative musculoskeletal ROS (+)   Abdominal (+) - obese,   Peds  Hematology negative hematology ROS (+)   Anesthesia Other Findings Past Medical History: No date: Anxiety     Comment:  no meds No date: Complication of anesthesia No date: GERD (gastroesophageal reflux disease)     Comment:  no meds No date: No known health problems No date: PONV (postoperative nausea and vomiting)   Reproductive/Obstetrics (+) Pregnancy (missed abortion)                            Anesthesia Physical Anesthesia Plan  ASA: II  Anesthesia Plan: General   Post-op Pain Management:    Induction: Intravenous  PONV Risk Score and Plan: 3 and Ondansetron, Dexamethasone, TIVA, Midazolam and Scopolamine patch - Pre-op  Airway Management Planned: LMA  Additional Equipment:   Intra-op Plan:   Post-operative Plan:   Informed Consent: I have reviewed the patients History and Physical, chart, labs and discussed the procedure including the risks,  benefits and alternatives for the proposed anesthesia with the patient or authorized representative who has indicated his/her understanding and acceptance.     Dental advisory given  Plan Discussed with: CRNA and Anesthesiologist  Anesthesia Plan Comments:         Anesthesia Quick Evaluation

## 2018-12-30 NOTE — Anesthesia Postprocedure Evaluation (Signed)
Anesthesia Post Note  Patient: Natalie Welch  Procedure(s) Performed: DILATATION AND EVACUATION (N/A )  Patient location during evaluation: PACU Anesthesia Type: General Level of consciousness: awake and alert and oriented Pain management: pain level controlled Vital Signs Assessment: post-procedure vital signs reviewed and stable Respiratory status: spontaneous breathing, nonlabored ventilation and respiratory function stable Cardiovascular status: blood pressure returned to baseline and stable Postop Assessment: no signs of nausea or vomiting Anesthetic complications: no     Last Vitals:  Vitals:   12/30/18 1238 12/30/18 1256  BP: (!) 97/50 (!) 108/50  Pulse: 91 75  Resp: (!) 23 16  Temp:  36.7 C  SpO2: 100% 100%    Last Pain:  Vitals:   12/30/18 1256  TempSrc: Oral  PainSc: 0-No pain                 Damek Ende

## 2018-12-30 NOTE — Anesthesia Procedure Notes (Signed)
Procedure Name: LMA Insertion Performed by: Lara Palinkas, CRNA Pre-anesthesia Checklist: Patient identified, Patient being monitored, Timeout performed, Emergency Drugs available and Suction available Patient Re-evaluated:Patient Re-evaluated prior to induction Oxygen Delivery Method: Circle system utilized Preoxygenation: Pre-oxygenation with 100% oxygen Induction Type: IV induction Ventilation: Mask ventilation without difficulty LMA: LMA inserted LMA Size: 3.5 Tube type: Oral Number of attempts: 1 Placement Confirmation: positive ETCO2 and breath sounds checked- equal and bilateral Tube secured with: Tape Dental Injury: Teeth and Oropharynx as per pre-operative assessment        

## 2018-12-30 NOTE — Transfer of Care (Signed)
Immediate Anesthesia Transfer of Care Note  Patient: Natalie Welch  Procedure(s) Performed: DILATATION AND EVACUATION (N/A )  Patient Location: PACU  Anesthesia Type:General  Level of Consciousness: drowsy  Airway & Oxygen Therapy: Patient Spontanous Breathing and Patient connected to face mask oxygen  Post-op Assessment: Report given to RN and Post -op Vital signs reviewed and stable  Post vital signs: Reviewed and stable  Last Vitals:  Vitals Value Taken Time  BP    Temp    Pulse    Resp 34 12/30/2018 11:52 AM  SpO2    Vitals shown include unvalidated device data.  Last Pain:  Vitals:   12/30/18 1003  TempSrc: Tympanic  PainSc: 5          Complications: No apparent anesthesia complications

## 2018-12-30 NOTE — H&P (Signed)
Date of Initial H&P: 12/24/2018  History reviewed, patient examined, no change in status, stable for surgery.   Condolences were offered to the patient and her family.  I stressed that while emotionally difficult, that this did not occur because of an actions or inactions by the patient.  Somewhere between 10-20% of identified first trimester pregnancies will unfortunately end in miscarriage.  Given this relatively high incidence rate, further diagnostic testing such as chromosome analysis is generally not clinically relevant nor recommended.  Although the chromosomal abnormalities have been implicated at rates as high as 70% in some studies, these are generally random and do not infer and increased risk of recurrence with subsequent pregnancies. Dilation and curettage has the highest rate of uterine evacuation, but carries with is operative cost, surgical and anesthetic risk.  While these risk are relatively small they nevertheless include infection, bleeding, uterine perforation, formation of uterine synechia, and in rare cases death.    Vena Austria, MD, Evern Core [facility], [facility] 12/30/2018, 10:29 AM

## 2018-12-30 NOTE — Op Note (Signed)
Preoperative Diagnosis: 1) 28 y.o. with blighted ovum  Postoperative Diagnosis: 1) 28 y.o. with blighted ovum  Operation Performed: Suction dilation and curettage  Indication: 28 year old who on serial ultrasound showed gestational sac and yolk sac without development of a fetal pole  Anesthesia: .General  Primary Surgeon: Vena Austria, MD  Assistant: none  Preoperative Antibiotics: none  Estimated Blood Loss: 30 mL  IV Fluids:   Urine Output:: ~72mL straight cath  Drains or Tubes: none  Implants: none  Specimens Removed: Products of conception  Complications: none  Intraoperative Findings:  Uterus retroverted, non-enlarged, mobile.  Small amount of tissue removed, good uterine cry on sharp curettage throughout.  Cervix hemostatic post procedure  Patient Condition: stable  Procedure in Detail:  Patient was taken to the operating room were she was administered general endotracheal anesthesia.  She was positioned in the dorsal lithotomy position utilizing Allen stirups, prepped and draped in the usual sterile fashion.  Uterus was noted to be non-enlarged in size, retoverted.   Prior to proceeding with the case a time out was performed.  Attention was turned to the patient's pelvis.  A red rubber catheter was used to empty the patient's bladder.  An operative speculum was placed to allow visualization of the cervix.  The anterior lip of the cervix was grasped with a single tooth tenaculum and the cervix was sequentially dilated using pratt dilators.  Uterus sounded to 9cm.  A size 8 flexible suction curette was then advanced to the uterine fundus.  Several passes were undertaken to clear the uterine contents.  Sharp curettage was then  performed nothing good uterine cry throughout the cavity.  A final pass of the suction curette was then undertaken.  The resulting specimen was sent to pathology.    The single tooth tenaculum was removed from the cervix.  The tenaculum  sites and cervix were noted to be  Hemostatic before removing the operative speculum.  Sponge needle and instrument counts were corrects times two.  The patient tolerated the procedure well and was taken to the recovery room in stable condition.

## 2019-01-04 LAB — SURGICAL PATHOLOGY

## 2019-01-11 ENCOUNTER — Encounter: Payer: Self-pay | Admitting: Obstetrics and Gynecology

## 2019-01-11 ENCOUNTER — Ambulatory Visit (INDEPENDENT_AMBULATORY_CARE_PROVIDER_SITE_OTHER): Payer: BLUE CROSS/BLUE SHIELD | Admitting: Obstetrics and Gynecology

## 2019-01-11 ENCOUNTER — Other Ambulatory Visit: Payer: Self-pay

## 2019-01-11 VITALS — BP 128/66 | HR 103 | Ht 61.0 in | Wt 134.0 lb

## 2019-01-11 DIAGNOSIS — Z4889 Encounter for other specified surgical aftercare: Secondary | ICD-10-CM

## 2019-01-12 NOTE — Progress Notes (Signed)
Postoperative Follow-up Patient presents post op from [facility] 1weeks ago for blighted ovum, missed abortion.  Subjective: Patient reports marked improvement in her preop symptoms. Eating a regular diet without difficulty. The patient is not having any pain.  Activity: normal activities of daily living.  Objective: Blood pressure 128/66, pulse (!) 103, height 5\' 1"  (1.549 m), weight 134 lb (60.8 kg), last menstrual period 10/17/2018, unknown if currently breastfeeding.  Gen: NAD, well nourished, appears stated age HEENT: normocephalic, anxiteric Pulmonary: no increased work of breathing Psych: mood appropriate, affect full  Admission on 12/30/2018, Discharged on 12/30/2018  Component Date Value Ref Range Status  . ABO/RH(D) 12/30/2018 O POS   Final  . Antibody Screen 12/30/2018 NEG   Final  . Sample Expiration 12/30/2018    Final                   Value:01/02/2019 Performed at [facility], [address] [place], [place] [address]   . ABO/RH(D) 12/30/2018    Final                   Value:O POS Performed at [facility], [address] [place], [place] [address]   . SURGICAL PATHOLOGY 12/30/2018    Final                   Value:Surgical Pathology CASE: [id number] PATIENT: Pauline Good Surgical Pathology Report     SPECIMEN SUBMITTED: A. Products of conception  CLINICAL HISTORY: None provided  PRE-OPERATIVE DIAGNOSIS: Missed abortion  POST-OPERATIVE DIAGNOSIS: Same as pre op     DIAGNOSIS: A.  PRODUCTS OF CONCEPTION; DILATATION AND CURETTAGE: - EDEMATOUS CHORIONIC VILLI AND DECIDUA CONSISTENT WITH HYDROPIC ABORTUS.  Comment: A p57 immunohistochemical stain was performed and demonstrates positive staining within the cytotrophoblastic and villous stromal cells.  This pattern of staining supports the above diagnosis.  IHC slides were prepared by [facility] for Molecular Biology and Pathology, [place], [place]. All controls stained  appropriately.  This test was developed and its performance characteristics determined by [facility]. It has not been cleared or approved by the [place] Food and Drug Administration. The FDA does not require this test to go through premarket FDA review. Th                         is test is used for clinical purposes. It should not be regarded as investigational or for research. This laboratory is certified under the Clinical Laboratory Improvement Amendments (CLIA) as qualified to perform high complexity clinical laboratory testing.    GROSS DESCRIPTION: A. Labeled: Products of conception Received: Formalin Tissue fragment(s): Multiple Size: Aggregate, 4.0 x 2.8 x 1.0 cm Villous tissue: Present Fetal tissue: Not identified Comment: Spongy placental/villous tissue is identified but no discrete fetal parts or grapelike cystic structures.  Block summary: Representatively submitted in A1.  After initial microscopic review, additional tissue is submitted in A2 and A3.    Final Diagnosis performed by Elijah Birk, MD.   Electronically signed 01/04/2019 10:50:36AM The electronic signature indicates that the named Attending Pathologist has evaluated the specimen  Technical component performed at [facility], [address] [place], [place] [address]                         [place] Lab: [phone] Dir: Jolene Schimke, MD, MMM  Professional component performed at [facility], [facility], [address] [place],  [place] [address] Lab: [phone] Dir: Georgiann Cocker. Rubinas, MD     Assessment: 28 y.o. s/p D&C stable  Plan: Patient has done well after surgery with no apparent complications.  I have discussed the post-operative course to date, and the expected progress moving forward.  The patient understands what complications to be concerned about.  I will see the patient in routine follow up, or sooner if needed.    Activity plan: No restriction.   Vena Austria, MD, Evern Core  [facility], [facility] 01/12/2019, 9:45 AM

## 2019-03-27 ENCOUNTER — Ambulatory Visit (INDEPENDENT_AMBULATORY_CARE_PROVIDER_SITE_OTHER): Payer: BLUE CROSS/BLUE SHIELD

## 2019-03-27 ENCOUNTER — Encounter: Payer: Self-pay | Admitting: Emergency Medicine

## 2019-03-27 ENCOUNTER — Other Ambulatory Visit: Payer: Self-pay

## 2019-03-27 ENCOUNTER — Ambulatory Visit
Admission: EM | Admit: 2019-03-27 | Discharge: 2019-03-27 | Disposition: A | Discharge status: Home / Self Care | Payer: BLUE CROSS/BLUE SHIELD | Attending: Urgent Care | Admitting: Urgent Care

## 2019-03-27 DIAGNOSIS — K5900 Constipation, unspecified: Secondary | ICD-10-CM

## 2019-03-27 DIAGNOSIS — R1032 Left lower quadrant pain: Secondary | ICD-10-CM

## 2019-03-27 DIAGNOSIS — N926 Irregular menstruation, unspecified: Secondary | ICD-10-CM

## 2019-03-27 DIAGNOSIS — Z3202 Encounter for pregnancy test, result negative: Secondary | ICD-10-CM

## 2019-03-27 LAB — URINALYSIS, COMPLETE (UACMP) WITH MICROSCOPIC
Bacteria, UA: NONE SEEN
Bilirubin Urine: NEGATIVE
Glucose, UA: NEGATIVE mg/dL
Leukocytes,Ua: NEGATIVE
Nitrite: NEGATIVE
Protein, ur: NEGATIVE mg/dL
Specific Gravity, Urine: 1.025 (ref 1.005–1.030)
WBC, UA: NONE SEEN WBC/hpf (ref 0–5)
pH: 6 (ref 5.0–8.0)

## 2019-03-27 LAB — PREGNANCY, URINE: Preg Test, Ur: NEGATIVE

## 2019-03-27 NOTE — ED Triage Notes (Signed)
Patient left sided abdominal that started Friday afternoon.  Patient denies N/V/D. Patient started having some vaginal spotting yesterday.  Patient denies vaginal discharge.  Patient denies fevers. Patient denies any cold symptoms.  Patient states that her last bowel movement was on Thursday and states that he stools were loose.

## 2019-03-27 NOTE — ED Provider Notes (Signed)
[address], [address] [place], [place] [address] [phone]   Name: Natalie ConchKorie J Haacke DOB: [birth year] MRN: [id number] CSN: [id number] PCP: Patient, No Pcp Per  Arrival date and time:  03/27/19 1004  Chief Complaint:  Vaginal Bleeding and Abdominal Pain (left sided)  NOTE: Prior to seeing the patient today, I have reviewed the triage nursing documentation and vital signs. Clinical staff has updated patient's PMH/PSHx, current medication list, and drug allergies/intolerances to ensure comprehensive history available to assist in medical decision making.   History:   HPI: Natalie Welch is a 28 y.o. female who presents today with complaints of LEFT lower quadrant abdominal pain that began on Friday (03/25/2019).  Patient denies any associated nausea, vomiting, fever, chills, or urinary symptoms. Patient describes the pain in her LLQ as being sharp in nature.  Patient advising that her stomach was "torn up" during the first part of the week causing her stools to be loose.  Her last bowel movement was on on Thursday, at which time her stools were more formed as compared to earlier in the week.  Additionally, patient complaining of bleeding that started with acute onset on Saturday (03/26/2019).  Bleeding reported to be minimal and is described by the patient as spotting.  Patient suffered a miscarriage in March of this year, which required her to undergo a D&C.  Since her procedure, patient's menstrual cycles have been irregular.  Patient denies any vaginal discharge or associated pelvic pain.  Her menstrual cycle in April lasted for 18 days, and her cycle in May lasted 12 days.  Patient is normal menstrual cycles, prior to her D&C, generally lasted about a week.  Patient notes that she is actively trying to get pregnant at this time.  Past Medical History:  Diagnosis Date  . Anxiety    no meds  . Complication of anesthesia   . GERD (gastroesophageal reflux disease)    no meds  . No  known health problems   . PONV (postoperative nausea and vomiting)     Past Surgical History:  Procedure Laterality Date  . DILATION AND CURETTAGE OF UTERUS    . DILATION AND EVACUATION N/A 12/30/2018   Procedure: DILATATION AND EVACUATION;  Surgeon: Vena AustriaStaebler, Andreas, MD;  Location: [facility] ORS;  Service: Gynecology;  Laterality: N/A;  . WISDOM TOOTH EXTRACTION      Family History  Problem Relation Age of Onset  . Hypertension Mother   . Healthy Father     Social History   Socioeconomic History  . Marital status: Married    Spouse name: Not on file  . Number of children: Not on file  . Years of education: Not on file  . Highest education level: Not on file  Occupational History  . Not on file  Social Needs  . Financial resource strain: Not on file  . Food insecurity:    Worry: Not on file    Inability: Not on file  . Transportation needs:    Medical: Not on file    Non-medical: Not on file  Tobacco Use  . Smoking status: Never Smoker  . Smokeless tobacco: Never Used  Substance and Sexual Activity  . Alcohol use: Yes  . Drug use: Never  . Sexual activity: Yes    Birth control/protection: None  Lifestyle  . Physical activity:    Days per week: Not on file    Minutes per session: Not on file  . Stress: Not on file  Relationships  . Social connections:  Talks on phone: Not on file    Gets together: Not on file    Attends religious service: Not on file    Active member of club or organization: Not on file    Attends meetings of clubs or organizations: Not on file    Relationship status: Not on file  . Intimate partner violence:    Fear of current or ex partner: Not on file    Emotionally abused: Not on file    Physically abused: Not on file    Forced sexual activity: Not on file  Other Topics Concern  . Not on file  Social History Narrative  . Not on file    Patient Active Problem List   Diagnosis Date Noted  . Supervision of normal first pregnancy,  antepartum 11/23/2018    Home Medications:    No outpatient medications have been marked as taking for the [facility]).    Allergies:   Abilify [aripiprazole] and Nuvigil [armodafinil]  Review of Systems (ROS): Review of Systems  Constitutional: Negative for chills and fever.  Respiratory: Negative for cough and shortness of breath.   Cardiovascular: Negative for chest pain and palpitations.  Gastrointestinal: Positive for abdominal pain. Negative for blood in stool, nausea, rectal pain and vomiting.       (+) varying bowel habits  Genitourinary: Positive for menstrual problem (irregular) and vaginal bleeding. Negative for dyspareunia, dysuria, flank pain, frequency, hematuria, pelvic pain, urgency, vaginal discharge and vaginal pain.     Physical Exam:  Triage Vital Signs ED Triage Vitals  Enc Vitals Group     BP 03/27/19 1020 128/80     Pulse Rate 03/27/19 1020 80     Resp 03/27/19 1020 14     Temp 03/27/19 1020 98.8 F (37.1 C)     Temp Source 03/27/19 1020 Oral     SpO2 03/27/19 1020 99 %     Weight 03/27/19 1015 135 lb (61.2 kg)     Height 03/27/19 1015  (1.549 m)     Head Circumference --      Peak Flow --      Pain Score 03/27/19 1015 8     Pain Loc --      Pain Edu? --      Excl. in GC? --     Physical Exam  Constitutional: She is oriented to person, place, and time and well-developed, well-nourished, and in no distress.  HENT:  Head: Normocephalic and atraumatic.  Mouth/Throat: Oropharynx is clear and moist and mucous membranes are normal.  Cardiovascular: Normal rate, regular rhythm, normal heart sounds and intact distal pulses. Exam reveals no gallop and no friction rub.  No murmur heard. Pulmonary/Chest: Effort normal and breath sounds normal. No respiratory distress. She has no wheezes. She has no rales.  Abdominal: Soft. Normal appearance. She exhibits no distension. Bowel sounds are hypoactive. There is no  hepatosplenomegaly. There is abdominal tenderness in the left lower quadrant. There is no rebound and no CVA tenderness.  Neurological: She is alert and oriented to person, place, and time.  Skin: Skin is warm and dry. No rash noted. No erythema.  Psychiatric: Mood, affect and judgment normal.  Nursing note and vitals reviewed.    Urgent Care Treatments / Results:   LABS: PLEASE NOTE: all labs that were ordered this encounter are listed, however only abnormal results are displayed. Labs Reviewed  URINALYSIS, COMPLETE (UACMP) WITH MICROSCOPIC - Abnormal; Notable for the following components:  Result Value   Hgb urine dipstick TRACE (*)    Ketones, ur TRACE (*)    All other components within normal limits  PREGNANCY, URINE    EKG: -None  RADIOLOGY: Dg Abdomen 1 View  Result Date: 03/27/2019 CLINICAL DATA:  Left lower quadrant pain and constipation. EXAM: ABDOMEN - 1 VIEW COMPARISON:  None. FINDINGS: The bowel gas pattern is normal. Stool burden is moderate. No radio-opaque calculi or other significant radiographic abnormality are seen. IMPRESSION: No acute finding.  Moderate stool burden. Electronically Signed   By: Drusilla Kanner M.D.   On: 03/27/2019 11:07    PRODEDURES: Procedures  MEDICATIONS RECEIVED THIS VISIT: Medications - No data to display  PERTINENT CLINICAL COURSE NOTES/UPDATES: No data to display   Initial Impression / Assessment and Plan / Urgent Care Course:    JAIYAH PINERA is a 28 y.o. female who presents to [facility] today with complaints of Vaginal Bleeding and Abdominal Pain (left sided)  Pertinent labs & imaging results that were available during my care of the patient were personally reviewed by me and considered in my medical decision making (see lab/imaging section of note for values and interpretations).  Exam revealed tenderness with bimanual palpation over the patient's LEFT lower abdomen.  No urinary symptoms.  UA negative for  infection. Urine HCG negative for pregnancy.  Given reported history of recent varying bowel habits, KUB was performed that demonstrated constipation. Patient endorses use of anti-diarrheal medications earlier in the week. Patient encouraged to increase her daily fluid intake. Reviewed foods that are high in fiber. Discussed over-the-counter stool softeners and/or laxatives to help stimulate bowels if not resolving and pain persists. Reviewed that and increase in physical activity will also help promote bowel movements.  Regarding her vaginal bleeding, discussed that irregular cycles can be normal following both miscarriage and D&C.  Encouraged patient to follow-up with OB/GYN provider to discuss further is bleeding persists. No immediate concerns at this point in time.   Discussed follow up with primary care physician in 1 week for re-evaluation. I have reviewed the follow up and strict return precautions for any new or worsening symptoms. Patient is aware of symptoms that would be deemed urgent/emergent, and would thus require further evaluation either here or in the emergency department. At the time of discharge, she verbalized understanding and consent with the discharge plan as it was reviewed with her. All questions were fielded by provider and/or clinic staff prior to patient discharge.    Final Clinical Impressions(s) / Urgent Care Diagnoses:   Final diagnoses:  Constipation, unspecified constipation type  Irregular menstrual cycle  Pregnancy test negative  Left lower quadrant abdominal pain    New Prescriptions:  No orders of the defined types were placed in this encounter.   Controlled Substance Prescriptions:  [place] Controlled Substance Registry consulted? Not Applicable  NOTE: This note was prepared using Dragon dictation software along with smaller phrase technology. Despite my best ability to proofread, there is the potential that transcriptional errors may still occur from this  process, and are completely unintentional.     Verlee Monte, NP 03/28/19 1516

## 2019-03-27 NOTE — Discharge Instructions (Addendum)
It was very nice meeting you today in clinic. Thank you for entrusting me with your care.   As discussed, you films show that you are constipated.  Increase your fluid and fiber intake. Make sure you are remaining active.   Make arrangements to follow up with your regular doctor in 1 week for re-evaluation. If your symptoms/condition worsens, please seek follow up care either here or in the ER. Please remember, our [facility] providers are "right here with you" when you need [place].   Again, it was my pleasure to take care of you today. Thank you for choosing our clinic. I hope that you start to feel better quickly.   Quentin Mulling, MSN, APRN, FNP-C, CEN Advanced Practice Provider [facility]

## 2019-04-06 ENCOUNTER — Telehealth: Payer: Self-pay

## 2019-04-06 ENCOUNTER — Ambulatory Visit (INDEPENDENT_AMBULATORY_CARE_PROVIDER_SITE_OTHER): Payer: BC Managed Care – PPO | Admitting: Obstetrics and Gynecology

## 2019-04-06 ENCOUNTER — Encounter: Payer: Self-pay | Admitting: Obstetrics and Gynecology

## 2019-04-06 ENCOUNTER — Other Ambulatory Visit: Payer: Self-pay

## 2019-04-06 VITALS — BP 120/84 | Ht 61.0 in | Wt 134.6 lb

## 2019-04-06 DIAGNOSIS — N923 Ovulation bleeding: Secondary | ICD-10-CM

## 2019-04-06 DIAGNOSIS — Z3202 Encounter for pregnancy test, result negative: Secondary | ICD-10-CM | POA: Diagnosis not present

## 2019-04-06 DIAGNOSIS — O0281 Inappropriate change in quantitative human chorionic gonadotropin (hCG) in early pregnancy: Secondary | ICD-10-CM | POA: Diagnosis not present

## 2019-04-06 LAB — POCT URINE PREGNANCY: Preg Test, Ur: NEGATIVE

## 2019-04-06 NOTE — Telephone Encounter (Signed)
Pt calling to see if she needs to keep her appt this pm c AMS or not.  Fri and Sat she had positive pregnancy tests.  Yesterday she had two negative pregnancy tests.  She started period this morning.  [phone]  Her period is 7d late.  As far as she can tell, it is normal flow.  Adv to keep appt for evaluation since pos tests and 7d late.  Maybe blood test will be done.  Pt agrees.

## 2019-04-07 NOTE — Progress Notes (Signed)
Obstetric Problem Visit    Chief Complaint:  Chief Complaint  Patient presents with  . Possible Pregnancy    fri and sat pos UPT's, yesterday took 2 UPT's both neg  . Vaginal Bleeding    as of 730 am this morning started bleeding, has changed tampons 3 times    History of Present Illness: Patient is a 28 y.o. G1P0010 Unknown presenting for first trimester bleeding.  The onset of bleeding was 4 days ago.  The patient reports being late for her menstrual cycle by about a week.  She has a positive UPT on 04/01/2019 and 6/6/202020 followed by two negative UPT and vaginal bleeding over the weekend.  No significant abdominal pain.  No risk factors for ectopic pregnancy.  Is bleeding equal to or greater than normal menstrual flow:  Yes Any recent trauma:  No Recent intercourse:  No History of prior miscarriage:  Yes Prior ultrasound demonstrating IUP: none.  Prior ultrasound demonstrating viable IUP:  No Prior Serum HCG:  No  Review of Systems: ROS  Past Medical History:  Past Medical History:  Diagnosis Date  . Anxiety    no meds  . Complication of anesthesia   . GERD (gastroesophageal reflux disease)    no meds  . No known health problems   . PONV (postoperative nausea and vomiting)     Past Surgical History:  Past Surgical History:  Procedure Laterality Date  . DILATION AND CURETTAGE OF UTERUS    . DILATION AND EVACUATION N/A 12/30/2018   Procedure: DILATATION AND EVACUATION;  Surgeon: Malachy Mood, MD;  Location: [facility] ORS;  Service: Gynecology;  Laterality: N/A;  . WISDOM TOOTH EXTRACTION      Obstetric History: G1P0010  Family History:  Family History  Problem Relation Age of Onset  . Hypertension Mother   . Healthy Father     Social History:  Social History   Socioeconomic History  . Marital status: Married    Spouse name: Not on file  . Number of children: Not on file  . Years of education: Not on file  . Highest education level: Not on file   Occupational History  . Not on file  Social Needs  . Financial resource strain: Not on file  . Food insecurity    Worry: Not on file    Inability: Not on file  . Transportation needs    Medical: Not on file    Non-medical: Not on file  Tobacco Use  . Smoking status: Never Smoker  . Smokeless tobacco: Never Used  Substance and Sexual Activity  . Alcohol use: Yes  . Drug use: Never  . Sexual activity: Yes    Birth control/protection: None  Lifestyle  . Physical activity    Days per week: Not on file    Minutes per session: Not on file  . Stress: Not on file  Relationships  . Social Herbalist on phone: Not on file    Gets together: Not on file    Attends religious service: Not on file    Active member of club or organization: Not on file    Attends meetings of clubs or organizations: Not on file    Relationship status: Not on file  . Intimate partner violence    Fear of current or ex partner: Not on file    Emotionally abused: Not on file    Physically abused: Not on file    Forced sexual activity: Not on file  Other Topics Concern  . Not on file  Social History Narrative  . Not on file    Allergies:  Allergies  Allergen Reactions  . Abilify [Aripiprazole] Other (See Comments)    Oculogyric crisis --eyes rolled in back of head  . Nuvigil [Armodafinil] Swelling    Tongue and throat swelling    Medications: Prior to Admission medications   Not on File    Physical Exam Vitals: Blood pressure 120/84, height 5\' 1"  (1.549 m), weight 134 lb 9.6 oz (61.1 kg), last menstrual period 03/02/2019. General: NAD, well nourished, appears stated age HEENT: normocephalic, anicteric Pulmonary: No increased work of breathing, Neurologic: Grossly intact Psychiatric: mood appropriate, affect full  Assessment: 28 y.o. G1P0010 Unknown presenting for evaluation of first trimester vaginal bleeding, likely chemical pregnancy  Plan: Problem List Items Addressed This  Visit    None    Visit Diagnoses    Spotting between menses    -  Primary   Relevant Orders   POCT urine pregnancy (Completed)   Chemical pregnancy          1) First trimester bleeding - incidence and clinical course of first trimester bleeding is discussed in detail with the patient today.  Approximately 1/3 of pregnancies ending in live births experienced 1st trimester bleeding.  The amount of bleeding is variable and not necessarily predictive of outcome.  Sources may be cervical or uterine.  Subchorionic hemorrhages are a frequent concurrent findings on ultrasound and are followed expectantly.  These often absorb or regress spontaneously although risk for expansion and further disruption of the utero-placental interface leading to miscarriage is possible.  There is no clearly documented benefit to limiting or modifying activity and sexual intercourse in altering clinic course of 1st trimester bleeding.   - given only 1 week late for menstrual cycle positive UPT followed by negative UPT we discussed that this likely represents a chemical pregnancy where fertilization occurred by implantation did not.    2) Routine bleeding precautions were discussed with the patient prior the conclusion of today's visit.  3) Return in about 7 months (around 11/06/2019) for annual.     Vena AustriaAndreas Scherry Laverne, MD, Merlinda FrederickFACOG [facility], [facility]

## 2019-06-17 ENCOUNTER — Ambulatory Visit (INDEPENDENT_AMBULATORY_CARE_PROVIDER_SITE_OTHER): Payer: BC Managed Care – PPO | Admitting: Obstetrics and Gynecology

## 2019-06-17 ENCOUNTER — Other Ambulatory Visit (HOSPITAL_COMMUNITY)
Admission: RE | Admit: 2019-06-17 | Discharge: 2019-06-17 | Disposition: A | Discharge status: Home / Self Care | Payer: BC Managed Care – PPO | Source: Ambulatory Visit | Attending: Obstetrics and Gynecology | Admitting: Obstetrics and Gynecology

## 2019-06-17 ENCOUNTER — Other Ambulatory Visit: Payer: Self-pay

## 2019-06-17 ENCOUNTER — Encounter: Payer: Self-pay | Admitting: Obstetrics and Gynecology

## 2019-06-17 VITALS — BP 104/60 | Wt 133.0 lb

## 2019-06-17 DIAGNOSIS — Z3A01 Less than 8 weeks gestation of pregnancy: Secondary | ICD-10-CM

## 2019-06-17 DIAGNOSIS — Z3480 Encounter for supervision of other normal pregnancy, unspecified trimester: Secondary | ICD-10-CM | POA: Insufficient documentation

## 2019-06-17 DIAGNOSIS — N912 Amenorrhea, unspecified: Secondary | ICD-10-CM | POA: Diagnosis not present

## 2019-06-17 DIAGNOSIS — Z3201 Encounter for pregnancy test, result positive: Secondary | ICD-10-CM

## 2019-06-17 DIAGNOSIS — Z3481 Encounter for supervision of other normal pregnancy, first trimester: Secondary | ICD-10-CM

## 2019-06-17 DIAGNOSIS — Z8759 Personal history of other complications of pregnancy, childbirth and the puerperium: Secondary | ICD-10-CM

## 2019-06-17 LAB — POCT URINE PREGNANCY: Preg Test, Ur: POSITIVE — AB

## 2019-06-17 NOTE — Progress Notes (Signed)
New Obstetric Patient H&P    Chief Complaint: "Desires prenatal care"   History of Present Illness: Patient is a 28 y.o. G2P0010 Not Hispanic or Latino female, presents with amenorrhea and positive home pregnancy test. Patient's last menstrual period was 05/04/2019. and based on her  LMP, her EDD is Estimated Date of Delivery: 02/08/20 and her EGA is 6743w2d.  Her last pap smear was 11/23/2018 and was no abnormalities.    Her last menstrual period was normal. Since her LMP she claims she has experienced fatigue, nausea (improved on Bonjesta samples). She denies vaginal bleeding. Her past medical history is noncontributory. Her prior pregnancies are notable for none  Since her LMP, she admits to the use of tobacco products  no There are cats in the home in the home  no  She admits close contact with children on a regular basis  no  She has had chicken pox in the past yes She has had Tuberculosis exposures, symptoms, or previously tested positive for TB   no Current or past history of domestic violence. no  Genetic Screening/Teratology Counseling: (Includes patient, baby's father, or anyone in either family with:)   1. [facility]'s age [facility]/= [facility]35 [facility]  no 2. Thalassemia ([place], [place], Mediterranean, or Asian background): MCV<80  no 3. Neural tube defect (meningomyelocele, spina bifida, anencephaly)  no 4. Congenital heart defect  no  5. Down syndrome  no 6. Tay-Sachs (Jewish, [place])  no 7. Canavan's Disease  no 8. Sickle cell disease or trait (African)  no  9. Hemophilia or other blood disorders  no  10. Muscular dystrophy  no  11. Cystic fibrosis  no  12. Huntington's Chorea  no  13. Mental retardation/autism  no 14. Other inherited genetic or chromosomal disorder  no 15. Maternal metabolic disorder (DM, PKU, etc)  no 16. Patient or [name] with a child with a birth defect not listed above no  16a. Patient or [name] with a birth defect themselves no 17. Recurrent pregnancy loss, or  stillbirth  no  18. Any medications since LMP other than prenatal vitamins (include vitamins, supplements, OTC meds, drugs, alcohol)  no 19. Any other genetic/environmental exposure to discuss  no  Infection History:   1. Lives with someone with TB or TB exposed  no  2. Patient or partner has history of genital herpes  no 3. Rash or viral illness since LMP  no 4. History of STI (GC, CT, HPV, syphilis, HIV)  no 5. History of recent travel :  no  Other pertinent information:  no     Review of Systems:10 point review of systems negative unless otherwise noted in HPI  Past Medical History:  Past Medical History:  Diagnosis Date  . Anxiety    no meds  . Complication of anesthesia   . GERD (gastroesophageal reflux disease)    no meds  . No known health problems   . PONV (postoperative nausea and vomiting)     Past Surgical History:  Past Surgical History:  Procedure Laterality Date  . DILATION AND CURETTAGE OF UTERUS    . DILATION AND EVACUATION N/A 12/30/2018   Procedure: DILATATION AND EVACUATION;  Surgeon: Vena AustriaStaebler, Breanne Olvera, MD;  Location: [facility] ORS;  Service: Gynecology;  Laterality: N/A;  . WISDOM TOOTH EXTRACTION      Gynecologic History: Patient's last menstrual period was 05/04/2019.  Obstetric History: G2P0010  Family History:  Family History  Problem Relation Age of Onset  . Hypertension Mother   .  Healthy Father     Social History:  Social History   Socioeconomic History  . Marital status: Married    Spouse name: Not on file  . Number of children: Not on file  . Years of education: Not on file  . Highest education level: Not on file  Occupational History  . Not on file  Social Needs  . Financial resource strain: Not on file  . Food insecurity    Worry: Not on file    Inability: Not on file  . Transportation needs    Medical: Not on file    Non-medical: Not on file  Tobacco Use  . Smoking status: Never Smoker  . Smokeless tobacco: Never Used   Substance and Sexual Activity  . Alcohol use: Yes  . Drug use: Never  . Sexual activity: Yes    Birth control/protection: None  Lifestyle  . Physical activity    Days per week: Not on file    Minutes per session: Not on file  . Stress: Not on file  Relationships  . Social Musicianconnections    Talks on phone: Not on file    Gets together: Not on file    Attends religious service: Not on file    Active member of club or organization: Not on file    Attends meetings of clubs or organizations: Not on file    Relationship status: Not on file  . Intimate partner violence    Fear of current or ex partner: Not on file    Emotionally abused: Not on file    Physically abused: Not on file    Forced sexual activity: Not on file  Other Topics Concern  . Not on file  Social History Narrative  . Not on file    Allergies:  Allergies  Allergen Reactions  . Abilify [Aripiprazole] Other (See Comments)    Oculogyric crisis --eyes rolled in back of head  . Nuvigil [Armodafinil] Swelling    Tongue and throat swelling    Medications: Prior to Admission medications   Not on File    Physical Exam Vitals: Blood pressure 104/60, weight 133 lb (60.3 kg), last menstrual period 05/04/2019.  General: NAD HEENT: normocephalic, anicteric Thyroid: no enlargement, no palpable nodules Pulmonary: No increased work of breathing, CTAB Cardiovascular: RRR, distal pulses 2+ Abdomen: NABS, soft, non-tender, non-distended.  Umbilicus without lesions.  No hepatomegaly, splenomegaly or masses palpable. No evidence of hernia  Genitourinary:  External: Normal external female genitalia.  Normal urethral meatus, normal  Bartholin's and Skene's glands.    Vagina: Normal vaginal mucosa, no evidence of prolapse.    Cervix: Grossly normal in appearance, no bleeding  Uterus:  Non-enlarged, mobile, normal contour.  No CMT  Adnexa: ovaries non-enlarged, no adnexal masses  Rectal: deferred Extremities: no edema,  erythema, or tenderness Neurologic: Grossly intact Psychiatric: mood appropriate, affect full   Assessment: 28 y.o. G2P0010 at 5357w2d presenting to initiate prenatal care  Plan: 1) Avoid alcoholic beverages. 2) Patient encouraged not to smoke.  3) Discontinue the use of all non-medicinal drugs and chemicals.  4) Take prenatal vitamins daily.  5) Nutrition, food safety (fish, cheese advisories, and high nitrite foods) and exercise discussed. 6) Hospital and practice style discussed with cross coverage system.  7) Genetic Screening, such as with 1st Trimester Screening, cell free fetal DNA, AFP testing, and Ultrasound, as well as with amniocentesis and CVS as appropriate, is discussed with patient. At the conclusion of today's visit patient requested genetic testing  Malachy Mood, MD, [facility], [facility] 06/17/2019, 10:58 AM

## 2019-06-17 NOTE — Progress Notes (Signed)
NOB N&V 

## 2019-06-18 LAB — RPR+RH+ABO+RUB AB+AB SCR+CB...
Antibody Screen: NEGATIVE
HIV Screen 4th Generation wRfx: NONREACTIVE
Hematocrit: 38.2 % (ref 34.0–46.6)
Hemoglobin: 12.8 g/dL (ref 11.1–15.9)
Hepatitis B Surface Ag: NEGATIVE
MCH: 28 pg (ref 26.6–33.0)
MCHC: 33.5 g/dL (ref 31.5–35.7)
MCV: 84 fL (ref 79–97)
Platelets: 327 10*3/uL (ref 150–450)
RBC: 4.57 x10E6/uL (ref 3.77–5.28)
RDW: 13.3 % (ref 11.7–15.4)
RPR Ser Ql: NONREACTIVE
Rh Factor: POSITIVE
Rubella Antibodies, IGG: 3.27 index (ref >0.99)
Varicella zoster IgG: 1479 index (ref >165)
WBC: 11.5 10*3/uL — ABNORMAL HIGH (ref 3.4–10.8)

## 2019-06-18 LAB — CERVICOVAGINAL ANCILLARY ONLY
Chlamydia: NEGATIVE
Neisseria Gonorrhea: NEGATIVE

## 2019-06-19 LAB — URINE CULTURE: Organism ID, Bacteria: NO GROWTH

## 2019-06-20 ENCOUNTER — Ambulatory Visit (INDEPENDENT_AMBULATORY_CARE_PROVIDER_SITE_OTHER): Payer: BC Managed Care – PPO | Admitting: Obstetrics and Gynecology

## 2019-06-20 ENCOUNTER — Other Ambulatory Visit: Payer: Self-pay

## 2019-06-20 ENCOUNTER — Ambulatory Visit (INDEPENDENT_AMBULATORY_CARE_PROVIDER_SITE_OTHER): Payer: BC Managed Care – PPO

## 2019-06-20 VITALS — BP 112/70 | Wt 136.0 lb

## 2019-06-20 DIAGNOSIS — Z3400 Encounter for supervision of normal first pregnancy, unspecified trimester: Secondary | ICD-10-CM

## 2019-06-20 DIAGNOSIS — N8312 Corpus luteum cyst of left ovary: Secondary | ICD-10-CM | POA: Diagnosis not present

## 2019-06-20 DIAGNOSIS — N912 Amenorrhea, unspecified: Secondary | ICD-10-CM

## 2019-06-20 DIAGNOSIS — Z3A01 Less than 8 weeks gestation of pregnancy: Secondary | ICD-10-CM

## 2019-06-20 DIAGNOSIS — O3481 Maternal care for other abnormalities of pelvic organs, first trimester: Secondary | ICD-10-CM

## 2019-06-20 DIAGNOSIS — Z3480 Encounter for supervision of other normal pregnancy, unspecified trimester: Secondary | ICD-10-CM

## 2019-06-20 DIAGNOSIS — Z3401 Encounter for supervision of normal first pregnancy, first trimester: Secondary | ICD-10-CM

## 2019-06-20 LAB — POCT URINALYSIS DIPSTICK OB
Glucose, UA: NEGATIVE
POC,PROTEIN,UA: NEGATIVE

## 2019-06-20 MED ORDER — BONJESTA 20-20 MG PO TBCR: 1.0000 | EXTENDED_RELEASE_TABLET | Freq: Two times a day (BID) | ORAL | 2 refills | Status: AC

## 2019-06-20 NOTE — Progress Notes (Signed)
    Routine Prenatal Care Visit  Subjective  Natalie Welch is a 28 y.o. G2P0010 at [redacted]w[redacted]d being seen today for ongoing prenatal care.  She is currently monitored for the following issues for this low-risk pregnancy and has Supervision of normal first pregnancy, antepartum on their problem list.  ----------------------------------------------------------------------------------- Patient reports no complaints.   Contractions: Not present. Vag. Bleeding: None.  Movement: Absent. Denies leaking of fluid.  ----------------------------------------------------------------------------------- The following portions of the patient's history were reviewed and updated as appropriate: allergies, current medications, past family history, past medical history, past social history, past surgical history and problem list. Problem list updated.   Objective  Blood pressure 112/70, weight 136 lb (61.7 kg), last menstrual period 05/04/2019. Pregravid weight 135 lb (61.2 kg) Total Weight Gain 1 lb (0.454 kg) Urinalysis:      Fetal Status: Fetal Heart Rate (bpm): 125   Movement: Absent     General:  Alert, oriented and cooperative. Patient is in no acute distress.  Skin: Skin is warm and dry. No rash noted.   Cardiovascular: Normal heart rate noted  Respiratory: Normal respiratory effort, no problems with respiration noted  Abdomen: Soft, gravid, appropriate for gestational age. Pain/Pressure: Absent     Pelvic:  Cervical exam deferred        Extremities: Normal range of motion.     ental Status: Normal mood and affect. Normal behavior. Normal judgment and thought content.   US Ob Comp Less 14 Wks  Result Date: 06/20/2019 Patient Name: Natalie Welch DOB: 1990-11-03 MRN: [id number] ULTRASOUND REPORT Location: [facility] Date of Service: 06/20/2019 Indications:dating Findings: Natalie Welch intrauterine pregnancy is visualized with a CRL consistent with [redacted]w[redacted]d gestation, giving an (U/S) EDD of 02/06/2020. The  (U/S) EDD is consistent with the clinically established EDD of 02/08/2020. FHR: 125 BPM CRL measurement: 9.5 mm Yolk sac is visualized and appears normal and early anatomy is normal. Amnion: not visualized Right Ovary is normal in appearance. Left Ovary is normal appearance. Corpus luteal cyst:  Left ovary Survey of the adnexa demonstrates no adnexal masses. There is no free peritoneal fluid in the cul de sac. Impression: 1. [redacted]w[redacted]d Viable Singleton Intrauterine pregnancy by U/S. 2. (U/S) EDD is consistent with Clinically established EDD of 02/08/2020. Recommendations: 1.Clinical correlation with the patient's History and Physical Exam. Gweneth Dimitri, RT There is a viable singleton gestation.  The fetal biometry correlates with established dating. Detailed evaluation of the fetal anatomy is precluded by early gestational age.  It must be noted that a normal ultrasound particular at this early gestational age is unable to rule out fetal aneuploidy, risk of first trimester miscarriage, or anatomic birth defects. Malachy Mood, MD, [facility], [facility] 06/20/2019, 2:38 PM   Assessment   28 y.o. G2P0010 at [redacted]w[redacted]d by  02/08/2020, by Last Menstrual Period presenting for routine prenatal visit  Plan    Gestational age appropriate obstetric precautions including but not limited to vaginal bleeding, contractions, leaking of fluid and fetal movement were reviewed in detail with the patient.    - normal dating scan  Return in about 1 week (around 06/27/2019) for [place] Georgianne Fick).  Malachy Mood, MD, [facility], [place]

## 2019-06-20 NOTE — Progress Notes (Signed)
Rob Dating scan

## 2019-07-01 ENCOUNTER — Other Ambulatory Visit: Payer: Self-pay

## 2019-07-01 ENCOUNTER — Ambulatory Visit (INDEPENDENT_AMBULATORY_CARE_PROVIDER_SITE_OTHER): Payer: BC Managed Care – PPO | Admitting: Obstetrics and Gynecology

## 2019-07-01 VITALS — BP 102/68 | Wt 137.0 lb

## 2019-07-01 DIAGNOSIS — Z3400 Encounter for supervision of normal first pregnancy, unspecified trimester: Secondary | ICD-10-CM

## 2019-07-01 DIAGNOSIS — Z3401 Encounter for supervision of normal first pregnancy, first trimester: Secondary | ICD-10-CM

## 2019-07-01 DIAGNOSIS — Z3A08 8 weeks gestation of pregnancy: Secondary | ICD-10-CM

## 2019-07-01 LAB — POCT URINALYSIS DIPSTICK OB
Glucose, UA: NEGATIVE
POC,PROTEIN,UA: NEGATIVE

## 2019-07-01 NOTE — Addendum Note (Signed)
Addended by: [place], Kasai Beltran B on: 07/01/2019 02:50 PM   Modules accepted: Orders

## 2019-07-01 NOTE — Progress Notes (Signed)
    Routine Prenatal Care Visit  Subjective  Natalie Welch is a 28 y.o. G2P0010 at [redacted]w[redacted]d being seen today for ongoing prenatal care.  She is currently monitored for the following issues for this low-risk pregnancy and has Supervision of normal first pregnancy, antepartum on their problem list.  ----------------------------------------------------------------------------------- Patient reports nausea.   Contractions: Not present. Vag. Bleeding: None.  Movement: Absent. Denies leaking of fluid.  ----------------------------------------------------------------------------------- The following portions of the patient's history were reviewed and updated as appropriate: allergies, current medications, past family history, past medical history, past social history, past surgical history and problem list. Problem list updated.   Objective  Blood pressure 102/68, weight 137 lb (62.1 kg), last menstrual period 05/04/2019. Pregravid weight 135 lb (61.2 kg) Total Weight Gain 2 lb (0.907 kg) Urinalysis:      Fetal Status: Fetal Heart Rate (bpm): 170   Movement: Absent     General:  Alert, oriented and cooperative. Patient is in no acute distress.  Skin: Skin is warm and dry. No rash noted.   Cardiovascular: Normal heart rate noted  Respiratory: Normal respiratory effort, no problems with respiration noted  Abdomen: Soft, gravid, appropriate for gestational age. Pain/Pressure: Absent     Pelvic:  Cervical exam deferred        Extremities: Normal range of motion.     ental Status: Normal mood and affect. Normal behavior. Normal judgment and thought content.     Assessment   28 y.o. G2P0010 at [redacted]w[redacted]d by  02/08/2020, by Last Menstrual Period presenting for routine prenatal visit  Plan   Gestational age appropriate obstetric precautions including but not limited to vaginal bleeding, contractions, leaking of fluid and fetal movement were reviewed in detail with the patient.    Return in about 1  week (around 07/08/2019) for [place].  Malachy Mood, MD, [facility], [facility] 07/01/2019, 2:47 PM

## 2019-07-01 NOTE — Progress Notes (Signed)
ROB

## 2019-07-05 ENCOUNTER — Telehealth: Payer: Self-pay

## 2019-07-05 NOTE — Telephone Encounter (Signed)
Pt calling c/o taking Bonjesta and still has been throwing up since 7am.  Can't keep anything down.  [phone]  Pt has ate a few goldfish and has thrown them up.  Adv to let system rest.  Sip clear liquids for 24hrs, then bland food and try bonjesta again x24hrs, the work up to reg diet.  If doesn't keep liquids down to be seen either here or at the hosp.

## 2019-07-06 NOTE — Telephone Encounter (Signed)
Pt states she drank only water yesterday & popcicles. She was eventually, but today she can't stop vomiting again. Inquiring if she can be seen today. [phone]

## 2019-07-06 NOTE — Telephone Encounter (Addendum)
Spoke w/pt. She states she took her [place] about 30 minutes ago & it came back up within about 20 minutes. Advised on rising precautions, set alarm for 1 hour prior to arising, keep crackers at bedside & try eating a few and taking meds and lying back down until time to rise. Advised meds not effectively unless stays down for at least 30 minutes. Recommend trying some ice chips and seeing if she can tolerate 1-2 crackers and retry meds while resting/remaining still for at least 30 minutes (lying on left side may help). Advised to take pm dose at bedtime. Advised to monitor for being unable to keep anything down for a full 24 hours. Patient will follow these recommendations and call us back if she needs further instructions or apt. She did inquire if there were any other meds she could/should try. She has already tried ginger and it didn't help.

## 2019-07-06 NOTE — Telephone Encounter (Signed)
I would try taking it at bedtime if she is not already doing that. If she can't keep it down after trying for a day, someone can send in an antiemetic.

## 2019-07-08 ENCOUNTER — Ambulatory Visit (INDEPENDENT_AMBULATORY_CARE_PROVIDER_SITE_OTHER): Payer: BC Managed Care – PPO | Admitting: Obstetrics and Gynecology

## 2019-07-08 ENCOUNTER — Other Ambulatory Visit: Payer: Self-pay

## 2019-07-08 VITALS — BP 128/72 | Wt 133.0 lb

## 2019-07-08 DIAGNOSIS — Z3481 Encounter for supervision of other normal pregnancy, first trimester: Secondary | ICD-10-CM

## 2019-07-08 DIAGNOSIS — Z3A09 9 weeks gestation of pregnancy: Secondary | ICD-10-CM

## 2019-07-08 DIAGNOSIS — Z3400 Encounter for supervision of normal first pregnancy, unspecified trimester: Secondary | ICD-10-CM

## 2019-07-08 LAB — POCT URINALYSIS DIPSTICK OB
Glucose, UA: NEGATIVE
POC,PROTEIN,UA: NEGATIVE

## 2019-07-08 MED ORDER — ONDANSETRON 4 MG PO TBDP: 4.0000 mg | ORAL_TABLET | Freq: Four times a day (QID) | ORAL | 1 refills | Status: DC | PRN

## 2019-07-08 NOTE — Progress Notes (Signed)
    Routine Prenatal Care Visit  Subjective  Natalie Welch is a 28 y.o. G2P0010 at [redacted]w[redacted]d being seen today for ongoing prenatal care.  She is currently monitored for the following issues for this low-risk pregnancy and has Supervision of normal first pregnancy, antepartum on their problem list.  ----------------------------------------------------------------------------------- Patient reports nausea.   Contractions: Not present. Vag. Bleeding: None.  Movement: Absent. Denies leaking of fluid.  ----------------------------------------------------------------------------------- The following portions of the patient's history were reviewed and updated as appropriate: allergies, current medications, past family history, past medical history, past social history, past surgical history and problem list. Problem list updated.   Objective  Blood pressure 128/72, weight 133 lb (60.3 kg), last menstrual period 05/04/2019. Pregravid weight 135 lb (61.2 kg) Total Weight Gain -2 lb (-0.907 kg) Urinalysis:      Fetal Status: Fetal Heart Rate (bpm): 176   Movement: Absent     General:  Alert, oriented and cooperative. Patient is in no acute distress.  Skin: Skin is warm and dry. No rash noted.   Cardiovascular: Normal heart rate noted  Respiratory: Normal respiratory effort, no problems with respiration noted  Abdomen: Soft, gravid, appropriate for gestational age. Pain/Pressure: Absent     Pelvic:  Cervical exam deferred        Extremities: Normal range of motion.     ental Status: Normal mood and affect. Normal behavior. Normal judgment and thought content.     Assessment   28 y.o. G2P0010 at [redacted]w[redacted]d by  02/08/2020, by Last Menstrual Period presenting for routine prenatal visit  Plan   Gestational age appropriate obstetric precautions including but not limited to vaginal bleeding, contractions, leaking of fluid and fetal movement were reviewed in detail with the patient.    - Add zofran for  doxylamine/pyridoxine for nausea.  Discussed single study showing increased risk of cleft lip cleft palate and heart defects, subsequent studies and larger studies out of [place] and [place] have failed to show any consistent association between Zofran exposure and these birth defects.  It is also important to note that Zofran exposed pregnancies and non-exposed pregnancies likely differ in the degree of nausea these two groups experience.  Folate acid metabolism which is linked to clefting and heart defects is a more likely pathway to causing these.  It stand to reason that patients with worse nausea are less consistent with taking PNV and therefore have lower folic acid levels.     Return in about 1 week (around 07/15/2019) for [place].  Malachy Mood, MD, [facility], [facility] 07/08/2019, 9:46 AM

## 2019-07-08 NOTE — Progress Notes (Signed)
ROB

## 2019-07-18 ENCOUNTER — Other Ambulatory Visit: Payer: Self-pay

## 2019-07-18 ENCOUNTER — Ambulatory Visit (INDEPENDENT_AMBULATORY_CARE_PROVIDER_SITE_OTHER): Payer: BC Managed Care – PPO | Admitting: Obstetrics and Gynecology

## 2019-07-18 VITALS — BP 120/66 | Wt 137.0 lb

## 2019-07-18 DIAGNOSIS — Z3481 Encounter for supervision of other normal pregnancy, first trimester: Secondary | ICD-10-CM

## 2019-07-18 DIAGNOSIS — Z31438 Encounter for other genetic testing of female for procreative management: Secondary | ICD-10-CM | POA: Diagnosis not present

## 2019-07-18 DIAGNOSIS — Z1379 Encounter for other screening for genetic and chromosomal anomalies: Secondary | ICD-10-CM

## 2019-07-18 DIAGNOSIS — Z3400 Encounter for supervision of normal first pregnancy, unspecified trimester: Secondary | ICD-10-CM

## 2019-07-18 DIAGNOSIS — Z3A10 10 weeks gestation of pregnancy: Secondary | ICD-10-CM | POA: Diagnosis not present

## 2019-07-18 LAB — POCT URINALYSIS DIPSTICK OB
Glucose, UA: NEGATIVE
POC,PROTEIN,UA: NEGATIVE

## 2019-07-18 NOTE — Progress Notes (Signed)
    Routine Prenatal Care Visit  Subjective  Natalie Welch is a 28 y.o. G2P0010 at [redacted]w[redacted]d being seen today for ongoing prenatal care.  She is currently monitored for the following issues for this low-risk pregnancy and has Supervision of normal first pregnancy, antepartum on their problem list.  ----------------------------------------------------------------------------------- Patient reports constipation.   Contractions: Not present. Vag. Bleeding: None.  Movement: Absent. Denies leaking of fluid.  ----------------------------------------------------------------------------------- The following portions of the patient's history were reviewed and updated as appropriate: allergies, current medications, past family history, past medical history, past social history, past surgical history and problem list. Problem list updated.   Objective  Blood pressure 120/66, weight 137 lb (62.1 kg), last menstrual period 05/04/2019. Pregravid weight 135 lb (61.2 kg) Total Weight Gain 2 lb (0.907 kg) Urinalysis:      Fetal Status: Fetal Heart Rate (bpm): 150   Movement: Absent     General:  Alert, oriented and cooperative. Patient is in no acute distress.  Skin: Skin is warm and dry. No rash noted.   Cardiovascular: Normal heart rate noted  Respiratory: Normal respiratory effort, no problems with respiration noted  Abdomen: Soft, gravid, appropriate for gestational age. Pain/Pressure: Absent     Pelvic:  Cervical exam deferred        Extremities: Normal range of motion.     ental Status: Normal mood and affect. Normal behavior. Normal judgment and thought content.     Assessment   28 y.o. G2P0010 at 103w5d by  02/08/2020, by Last Menstrual Period presenting for routine prenatal visit  Plan   Gestational age appropriate obstetric precautions including but not limited to vaginal bleeding, contractions, leaking of fluid and fetal movement were reviewed in detail with the patient.    - NOB labs  today - Inherit est and [id number]  Return in about 1 week (around 07/25/2019) for ROB.  Malachy Mood, MD, [facility], [facility] 07/18/2019, 2:22 PM

## 2019-07-18 NOTE — Progress Notes (Signed)
ROB Constipation 

## 2019-07-19 LAB — RPR+RH+ABO+RUB AB+AB SCR+CB...
Antibody Screen: NEGATIVE
HIV Screen 4th Generation wRfx: NONREACTIVE
Hematocrit: 34.7 % (ref 34.0–46.6)
Hemoglobin: 11.9 g/dL (ref 11.1–15.9)
Hepatitis B Surface Ag: NEGATIVE
MCH: 29 pg (ref 26.6–33.0)
MCHC: 34.3 g/dL (ref 31.5–35.7)
MCV: 84 fL (ref 79–97)
Platelets: 324 10*3/uL (ref 150–450)
RBC: 4.11 x10E6/uL (ref 3.77–5.28)
RDW: 13.2 % (ref 11.7–15.4)
RPR Ser Ql: NONREACTIVE
Rh Factor: POSITIVE
Rubella Antibodies, IGG: 1.98 index (ref >0.99)
Varicella zoster IgG: 981 index (ref >165)
WBC: 13.4 10*3/uL — ABNORMAL HIGH (ref 3.4–10.8)

## 2019-07-22 LAB — MATERNIT 21 PLUS CORE, BLOOD
Fetal Fraction: 10
Result (T21): NEGATIVE
Trisomy 13 (Patau syndrome): NEGATIVE
Trisomy 18 (Edwards syndrome): NEGATIVE
Trisomy 21 (Down syndrome): NEGATIVE

## 2019-07-26 ENCOUNTER — Ambulatory Visit (INDEPENDENT_AMBULATORY_CARE_PROVIDER_SITE_OTHER): Payer: BC Managed Care – PPO | Admitting: Obstetrics and Gynecology

## 2019-07-26 ENCOUNTER — Other Ambulatory Visit: Payer: Self-pay

## 2019-07-26 VITALS — BP 116/70 | Wt 136.0 lb

## 2019-07-26 DIAGNOSIS — Z3A11 11 weeks gestation of pregnancy: Secondary | ICD-10-CM

## 2019-07-26 DIAGNOSIS — Z3400 Encounter for supervision of normal first pregnancy, unspecified trimester: Secondary | ICD-10-CM

## 2019-07-26 DIAGNOSIS — Z3401 Encounter for supervision of normal first pregnancy, first trimester: Secondary | ICD-10-CM

## 2019-07-26 LAB — POCT URINALYSIS DIPSTICK OB
Glucose, UA: NEGATIVE
POC,PROTEIN,UA: NEGATIVE

## 2019-07-26 NOTE — Progress Notes (Signed)
ROB constipation 

## 2019-07-27 NOTE — Progress Notes (Signed)
    Routine Prenatal Care Visit  Subjective  Natalie Welch is a 28 y.o. G2P0010 at [redacted]w[redacted]d being seen today for ongoing prenatal care.  She is currently monitored for the following issues for this low-risk pregnancy and has Supervision of normal first pregnancy, antepartum on their problem list.  ----------------------------------------------------------------------------------- Patient reports no complaints.   Contractions: Not present. Vag. Bleeding: None.  Movement: Absent. Denies leaking of fluid.  ----------------------------------------------------------------------------------- The following portions of the patient's history were reviewed and updated as appropriate: allergies, current medications, past family history, past medical history, past social history, past surgical history and problem list. Problem list updated.   Objective  Blood pressure 116/70, weight 136 lb (61.7 kg), last menstrual period 05/04/2019. Pregravid weight 135 lb (61.2 kg) Total Weight Gain 1 lb (0.454 kg) Urinalysis:      Fetal Status: Fetal Heart Rate (bpm): 155   Movement: Absent     General:  Alert, oriented and cooperative. Patient is in no acute distress.  Skin: Skin is warm and dry. No rash noted.   Cardiovascular: Normal heart rate noted  Respiratory: Normal respiratory effort, no problems with respiration noted  Abdomen: Soft, gravid, appropriate for gestational age. Pain/Pressure: Absent     Pelvic:  Cervical exam deferred        Extremities: Normal range of motion.     ental Status: Normal mood and affect. Normal behavior. Normal judgment and thought content.    There is no immunization history on file for this patient.   Assessment   28 y.o. G2P0010 at [redacted]w[redacted]d by  02/08/2020, by Last Menstrual Period presenting for routine prenatal visit  Plan   Pregnancy#2 Problems (from 05/04/19 to present)    Problem Noted Resolved   Supervision of normal first pregnancy, antepartum 11/23/2018 by  Rod Can, CNM No   Overview Addendum 07/27/2019  8:59 AM by Malachy Mood, MD    Clinic [facility] Prenatal Labs  Dating LMP = 7 week Blood type: O/Positive/-- (08/21 1133)   Genetic Screen NIPS:Normal XX Antibody:Negative (08/21 1133)  Anatomic [place]  Rubella: 3.27 (08/21 1133) Varicella:  Immune  GTT Early:               Third trimester:  RPR: Non Reactive (08/21 1133)   Rhogam  HBsAg: Negative (08/21 1133)   TDaP vaccine   Flu Shot: declined HIV: Non Reactive (08/21 1133)   Baby Food   Breast                             GBS:   Contraception  Pap: 11/23/2018  CBB     CS/VBAC NA   Support Person Ovid Curd            Gestational age appropriate obstetric precautions including but not limited to vaginal bleeding, contractions, leaking of fluid and fetal movement were reviewed in detail with the patient.    Return in about 1 week (around 08/02/2019) for [place].  Malachy Mood, MD, [facility], [place]

## 2019-07-28 LAB — INHERITEST CORE(CF97,SMA,FRAX)

## 2019-08-05 ENCOUNTER — Other Ambulatory Visit: Payer: Self-pay

## 2019-08-05 ENCOUNTER — Ambulatory Visit (INDEPENDENT_AMBULATORY_CARE_PROVIDER_SITE_OTHER): Payer: BC Managed Care – PPO | Admitting: Obstetrics and Gynecology

## 2019-08-05 VITALS — BP 110/54 | Wt 136.0 lb

## 2019-08-05 DIAGNOSIS — F410 Panic disorder [episodic paroxysmal anxiety] without agoraphobia: Secondary | ICD-10-CM

## 2019-08-05 DIAGNOSIS — O99341 Other mental disorders complicating pregnancy, first trimester: Secondary | ICD-10-CM

## 2019-08-05 DIAGNOSIS — Z3400 Encounter for supervision of normal first pregnancy, unspecified trimester: Secondary | ICD-10-CM

## 2019-08-05 DIAGNOSIS — F411 Generalized anxiety disorder: Secondary | ICD-10-CM

## 2019-08-05 DIAGNOSIS — Z3A13 13 weeks gestation of pregnancy: Secondary | ICD-10-CM

## 2019-08-05 MED ORDER — ESCITALOPRAM OXALATE 10 MG PO TABS: 10.0000 mg | ORAL_TABLET | Freq: Every day | ORAL | 3 refills | Status: DC

## 2019-08-05 NOTE — Progress Notes (Signed)
    Routine Prenatal Care Visit  Subjective  Natalie Welch is a 28 y.o. G2P0010 at [redacted]w[redacted]d being seen today for ongoing prenatal care.  She is currently monitored for the following issues for this high-risk pregnancy and has Supervision of normal first pregnancy, antepartum and Generalized anxiety disorder with panic attacks on their problem list.  ----------------------------------------------------------------------------------- Patient reports noted increased panic attacks.  Has history of anxiety/depression but Grandmother was recently placed in hospice.  .   Contractions: Not present. Vag. Bleeding: None.  Movement: Absent. Denies leaking of fluid.  ----------------------------------------------------------------------------------- The following portions of the patient's history were reviewed and updated as appropriate: allergies, current medications, past family history, past medical history, past social history, past surgical history and problem list. Problem list updated.   Objective  Blood pressure (!) 110/54, weight 136 lb (61.7 kg), last menstrual period 05/04/2019. Pregravid weight 135 lb (61.2 kg) Total Weight Gain 1 lb (0.454 kg) Urinalysis:      Fetal Status: Fetal Heart Rate (bpm): 150   Movement: Absent     General:  Alert, oriented and cooperative. Patient is in no acute distress.  Skin: Skin is warm and dry. No rash noted.   Cardiovascular: Normal heart rate noted  Respiratory: Normal respiratory effort, no problems with respiration noted  Abdomen: Soft, gravid, appropriate for gestational age. Pain/Pressure: Absent     Pelvic:  Cervical exam deferred        Extremities: Normal range of motion.     ental Status: Normal mood and affect. Normal behavior. Normal judgment and thought content.   No flowsheet data found.     Assessment   28 y.o. G2P0010 at [redacted]w[redacted]d by  02/08/2020, by Last Menstrual Period presenting for routine prenatal visit  Plan   Pregnancy#2  Problems (from 05/04/19 to present)    Problem Noted Resolved   Supervision of normal first pregnancy, antepartum 11/23/2018 by Rod Can, CNM No   Overview Addendum 07/28/2019  7:53 AM by Malachy Mood, MD    Clinic [facility] Prenatal Labs  Dating LMP = 7 week Blood type: O/Positive/-- (08/21 1133)   Genetic Screen NIPS:Normal XX, Inheritest normal Antibody:Negative (08/21 1133)  Anatomic [place]  Rubella: 3.27 (08/21 1133) Varicella:  Immune  GTT Early:               Third trimester:  RPR: Non Reactive (08/21 1133)   Rhogam  HBsAg: Negative (08/21 1133)   TDaP vaccine   Flu Shot: declined HIV: Non Reactive (08/21 1133)   Baby Food   Breast                             GBS:   Contraception  Pap: 11/23/2018  CBB     CS/VBAC NA   Support Person Ovid Curd              Gestational age appropriate obstetric precautions including but not limited to vaginal bleeding, contractions, leaking of fluid and fetal movement were reviewed in detail with the patient.    Discussed bereavement, given history of panic attacks and current episodes we discussed starting pharmacotherapy.  Will start on Lexapro 10mg  po daily  Return in about 1 week (around 08/12/2019) for [place].  Malachy Mood, MD, [facility], [facility] 08/05/2019, 11:37 AM

## 2019-08-05 NOTE — Progress Notes (Signed)
ROB Panic attacks have been bad/PHQ-9 GAD-7 given

## 2019-08-07 ENCOUNTER — Other Ambulatory Visit: Payer: Self-pay

## 2019-08-07 ENCOUNTER — Emergency Department
Admission: EM | Admit: 2019-08-07 | Discharge: 2019-08-07 | Disposition: A | Discharge status: Home / Self Care | Payer: BC Managed Care – PPO | Attending: Emergency Medicine | Admitting: Emergency Medicine

## 2019-08-07 DIAGNOSIS — Z5321 Procedure and treatment not carried out due to patient leaving prior to being seen by health care provider: Secondary | ICD-10-CM | POA: Insufficient documentation

## 2019-08-07 DIAGNOSIS — R109 Unspecified abdominal pain: Secondary | ICD-10-CM | POA: Diagnosis not present

## 2019-08-07 LAB — COMPREHENSIVE METABOLIC PANEL
ALT: 48 U/L — ABNORMAL HIGH (ref 0–44)
AST: 37 U/L (ref 15–41)
Albumin: 3.7 g/dL (ref 3.5–5.0)
Alkaline Phosphatase: 66 U/L (ref 38–126)
Anion gap: 9 (ref 5–15)
BUN: 7 mg/dL (ref 6–20)
CO2: 22 mmol/L (ref 22–32)
Calcium: 9.3 mg/dL (ref 8.9–10.3)
Chloride: 104 mmol/L (ref 98–111)
Creatinine, Ser: 0.6 mg/dL (ref 0.44–1.00)
GFR calc Af Amer: >60 mL/min (ref >60)
GFR calc non Af Amer: >60 mL/min (ref >60)
Glucose, Bld: 117 mg/dL — ABNORMAL HIGH (ref 70–99)
Potassium: 4.6 mmol/L (ref 3.5–5.1)
Sodium: 135 mmol/L (ref 135–145)
Total Bilirubin: 0.3 mg/dL (ref 0.3–1.2)
Total Protein: 7.3 g/dL (ref 6.5–8.1)

## 2019-08-07 LAB — CBC
HCT: 34.6 % — ABNORMAL LOW (ref 36.0–46.0)
Hemoglobin: 11.6 g/dL — ABNORMAL LOW (ref 12.0–15.0)
MCH: 28.6 pg (ref 26.0–34.0)
MCHC: 33.5 g/dL (ref 30.0–36.0)
MCV: 85.4 fL (ref 80.0–100.0)
Platelets: 303 10*3/uL (ref 150–400)
RBC: 4.05 MIL/uL (ref 3.87–5.11)
RDW: 13.5 % (ref 11.5–15.5)
WBC: 14.5 10*3/uL — ABNORMAL HIGH (ref 4.0–10.5)
nRBC: 0 % (ref 0.0–0.2)

## 2019-08-07 LAB — LIPASE, BLOOD: Lipase: 19 U/L (ref 11–51)

## 2019-08-07 NOTE — ED Triage Notes (Addendum)
First Nurse Note:  C/O abdominal pain today.  States she is 13.[redacted] weeks pregnant.  LMP:  05/04/2019.  G2 P0, had miscarriage in March 2020.  Patient of [facility]  AAOx3.  Skin warm and dry. NAD

## 2019-08-07 NOTE — ED Triage Notes (Signed)
Pt reports being [redacted] weeks pregnant. Pt states that she is having abd pain, states that she has been having issues with constipation during this pregnancy, used an enema a month ago that helped and has been on colace since, states passing small bits but her last BM was 2 weeks ago

## 2019-08-08 ENCOUNTER — Telehealth: Payer: Self-pay

## 2019-08-08 NOTE — Telephone Encounter (Signed)
Pt calling; went to ED yesterday for severe constipation; tried several enemas as a last resort and they didn't work; stayed in ED for 49min and left b/c when she arrived she went to bathroom.  Can she take more than one colace a day?  [phone]

## 2019-08-08 NOTE — Telephone Encounter (Signed)
She can take it twice a day but only for a short time. She should concentrate on a fiber supplement like metamucil and more water. Please let her know. Thanks.

## 2019-08-08 NOTE — Telephone Encounter (Signed)
Pt aware.  Pt stated her grandmother passed away yesterday as well.

## 2019-08-16 ENCOUNTER — Ambulatory Visit (INDEPENDENT_AMBULATORY_CARE_PROVIDER_SITE_OTHER): Payer: BC Managed Care – PPO | Admitting: Obstetrics and Gynecology

## 2019-08-16 ENCOUNTER — Other Ambulatory Visit: Payer: Self-pay

## 2019-08-16 VITALS — BP 110/60 | Wt 140.0 lb

## 2019-08-16 DIAGNOSIS — Z3402 Encounter for supervision of normal first pregnancy, second trimester: Secondary | ICD-10-CM

## 2019-08-16 DIAGNOSIS — Z3A14 14 weeks gestation of pregnancy: Secondary | ICD-10-CM

## 2019-08-16 DIAGNOSIS — Z3400 Encounter for supervision of normal first pregnancy, unspecified trimester: Secondary | ICD-10-CM

## 2019-08-16 LAB — POCT URINALYSIS DIPSTICK OB
Glucose, UA: NEGATIVE
POC,PROTEIN,UA: NEGATIVE

## 2019-08-16 NOTE — Progress Notes (Signed)
    Routine Prenatal Care Visit  Subjective  Natalie Welch is a 28 y.o. G2P0010 at [redacted]w[redacted]d being seen today for ongoing prenatal care.  She is currently monitored for the following issues for this low-risk pregnancy and has Supervision of normal first pregnancy, antepartum and Generalized anxiety disorder with panic attacks on their problem list.  ----------------------------------------------------------------------------------- Patient reports no complaints.   Contractions: Not present. Vag. Bleeding: None.  Movement: Absent. Denies leaking of fluid.  ----------------------------------------------------------------------------------- The following portions of the patient's history were reviewed and updated as appropriate: allergies, current medications, past family history, past medical history, past social history, past surgical history and problem list. Problem list updated.   Objective  Blood pressure 110/60, weight 140 lb (63.5 kg), last menstrual period 05/04/2019. Pregravid weight 135 lb (61.2 kg) Total Weight Gain 5 lb (2.268 kg) Urinalysis:      Fetal Status: Fetal Heart Rate (bpm): 130   Movement: Absent     General:  Alert, oriented and cooperative. Patient is in no acute distress.  Skin: Skin is warm and dry. No rash noted.   Cardiovascular: Normal heart rate noted  Respiratory: Normal respiratory effort, no problems with respiration noted  Abdomen: Soft, gravid, appropriate for gestational age. Pain/Pressure: Absent     Pelvic:  Cervical exam deferred        Extremities: Normal range of motion.     ental Status: Normal mood and affect. Normal behavior. Normal judgment and thought content.     Assessment   28 y.o. G2P0010 at [redacted]w[redacted]d by  02/08/2020, by Last Menstrual Period presenting for routine prenatal visit  Plan   Pregnancy#2 Problems (from 05/04/19 to present)    Problem Noted Resolved   Supervision of normal first pregnancy, antepartum 11/23/2018 by Rod Can, CNM No   Overview Addendum 07/28/2019  7:53 AM by Malachy Mood, MD    Clinic [facility] Prenatal Labs  Dating LMP = 7 week Blood type: O/Positive/-- (08/21 1133)   Genetic Screen NIPS:Normal XX, Inheritest normal Antibody:Negative (08/21 1133)  Anatomic [place]  Rubella: 3.27 (08/21 1133) Varicella:  Immune  GTT Early:               Third trimester:  RPR: Non Reactive (08/21 1133)   Rhogam  HBsAg: Negative (08/21 1133)   TDaP vaccine   Flu Shot: declined HIV: Non Reactive (08/21 1133)   Baby Food   Breast                             GBS:   Contraception  Pap: 11/23/2018  CBB     CS/VBAC NA   Support Person Ovid Curd              Gestational age appropriate obstetric precautions including but not limited to vaginal bleeding, contractions, leaking of fluid and fetal movement were reviewed in detail with the patient.    Return in about 1 week (around 08/23/2019) for [place].  Malachy Mood, MD, [facility], [facility] 08/16/2019, 11:34 AM

## 2019-08-16 NOTE — Progress Notes (Signed)
ROB

## 2019-08-23 ENCOUNTER — Other Ambulatory Visit: Payer: Self-pay

## 2019-08-23 ENCOUNTER — Ambulatory Visit (INDEPENDENT_AMBULATORY_CARE_PROVIDER_SITE_OTHER): Payer: BC Managed Care – PPO | Admitting: Obstetrics and Gynecology

## 2019-08-23 VITALS — BP 116/50 | Wt 142.0 lb

## 2019-08-23 DIAGNOSIS — Z3400 Encounter for supervision of normal first pregnancy, unspecified trimester: Secondary | ICD-10-CM

## 2019-08-23 DIAGNOSIS — F411 Generalized anxiety disorder: Secondary | ICD-10-CM

## 2019-08-23 DIAGNOSIS — Z363 Encounter for antenatal screening for malformations: Secondary | ICD-10-CM

## 2019-08-23 DIAGNOSIS — F410 Panic disorder [episodic paroxysmal anxiety] without agoraphobia: Secondary | ICD-10-CM

## 2019-08-23 DIAGNOSIS — Z3A15 15 weeks gestation of pregnancy: Secondary | ICD-10-CM

## 2019-08-23 DIAGNOSIS — O99342 Other mental disorders complicating pregnancy, second trimester: Secondary | ICD-10-CM

## 2019-08-23 LAB — POCT URINALYSIS DIPSTICK OB
Glucose, UA: NEGATIVE
POC,PROTEIN,UA: NEGATIVE

## 2019-08-23 NOTE — Progress Notes (Signed)
    Routine Prenatal Care Visit  Subjective  Natalie Welch is a 28 y.o. G2P0010 at [redacted]w[redacted]d being seen today for ongoing prenatal care.  She is currently monitored for the following issues for this low-risk pregnancy and has Supervision of normal first pregnancy, antepartum and Generalized anxiety disorder with panic attacks on their problem list.  ----------------------------------------------------------------------------------- Patient reports no complaints.   Contractions: Not present. Vag. Bleeding: None.  Movement: Absent. Denies leaking of fluid.  ----------------------------------------------------------------------------------- The following portions of the patient's history were reviewed and updated as appropriate: allergies, current medications, past family history, past medical history, past social history, past surgical history and problem list. Problem list updated.   Objective  Blood pressure (!) 116/50, weight 142 lb (64.4 kg), last menstrual period 05/04/2019. Pregravid weight 135 lb (61.2 kg) Total Weight Gain 7 lb (3.175 kg) Urinalysis:      Fetal Status: Fetal Heart Rate (bpm): 150   Movement: Absent     General:  Alert, oriented and cooperative. Patient is in no acute distress.  Skin: Skin is warm and dry. No rash noted.   Cardiovascular: Normal heart rate noted  Respiratory: Normal respiratory effort, no problems with respiration noted  Abdomen: Soft, gravid, appropriate for gestational age. Pain/Pressure: Absent     Pelvic:  Cervical exam deferred        Extremities: Normal range of motion.     ental Status: Normal mood and affect. Normal behavior. Normal judgment and thought content.     Assessment   28 y.o. G2P0010 at [redacted]w[redacted]d by  02/08/2020, by Last Menstrual Period presenting for routine prenatal visit  Plan   Pregnancy#2 Problems (from 05/04/19 to present)    Problem Noted Resolved   Supervision of normal first pregnancy, antepartum 11/23/2018 by  Rod Can, CNM No   Overview Addendum 07/28/2019  7:53 AM by Malachy Mood, MD    [facility] Prenatal Labs  Dating LMP = 7 week Blood type: O/Positive/-- (08/21 1133)   Genetic Screen NIPS:Normal XX, Inheritest normal Antibody:Negative (08/21 1133)  Anatomic [place]  Rubella: 3.27 (08/21 1133) Varicella:  Immune  GTT Early:               Third trimester:  RPR: Non Reactive (08/21 1133)   Rhogam  HBsAg: Negative (08/21 1133)   TDaP vaccine   Flu Shot: declined HIV: Non Reactive (08/21 1133)   Baby Food   Breast                             GBS:   Contraception  Pap: 11/23/2018  CBB     CS/VBAC NA   Support Person Ovid Curd              Gestational age appropriate obstetric precautions including but not limited to vaginal bleeding, contractions, leaking of fluid and fetal movement were reviewed in detail with the patient.    Return in about 1 week (around 08/30/2019) for 1 week ROB, 4 weeks ROB and anatomy scan.  Malachy Mood, MD, [facility], [facility] 08/23/2019, 3:07 PM

## 2019-08-23 NOTE — Progress Notes (Signed)
ROB Coughing, right side around ribs hurt

## 2019-09-02 ENCOUNTER — Other Ambulatory Visit: Payer: Self-pay

## 2019-09-02 ENCOUNTER — Ambulatory Visit (INDEPENDENT_AMBULATORY_CARE_PROVIDER_SITE_OTHER): Payer: BC Managed Care – PPO | Admitting: Obstetrics and Gynecology

## 2019-09-02 VITALS — BP 114/76 | Wt 142.0 lb

## 2019-09-02 DIAGNOSIS — Z3A17 17 weeks gestation of pregnancy: Secondary | ICD-10-CM

## 2019-09-02 DIAGNOSIS — F410 Panic disorder [episodic paroxysmal anxiety] without agoraphobia: Secondary | ICD-10-CM

## 2019-09-02 DIAGNOSIS — Z3400 Encounter for supervision of normal first pregnancy, unspecified trimester: Secondary | ICD-10-CM

## 2019-09-02 DIAGNOSIS — F411 Generalized anxiety disorder: Secondary | ICD-10-CM

## 2019-09-02 DIAGNOSIS — O99342 Other mental disorders complicating pregnancy, second trimester: Secondary | ICD-10-CM

## 2019-09-02 NOTE — Progress Notes (Signed)
    Routine Prenatal Care Visit  Subjective  Natalie Welch is a 28 y.o. G2P0010 at [redacted]w[redacted]d being seen today for ongoing prenatal care.  She is currently monitored for the following issues for this low-risk pregnancy and has Supervision of normal first pregnancy, antepartum and Generalized anxiety disorder with panic attacks on their problem list.  ----------------------------------------------------------------------------------- Patient reports no complaints.   Contractions: Not present. Vag. Bleeding: None.  Movement: Absent. Denies leaking of fluid.  ----------------------------------------------------------------------------------- The following portions of the patient's history were reviewed and updated as appropriate: allergies, current medications, past family history, past medical history, past social history, past surgical history and problem list. Problem list updated.   Objective  Blood pressure 114/76, weight 142 lb (64.4 kg), last menstrual period 05/04/2019. Pregravid weight 135 lb (61.2 kg) Total Weight Gain 7 lb (3.175 kg) Urinalysis:      Fetal Status: Fetal Heart Rate (bpm): 140   Movement: Absent     General:  Alert, oriented and cooperative. Patient is in no acute distress.  Skin: Skin is warm and dry. No rash noted.   Cardiovascular: Normal heart rate noted  Respiratory: Normal respiratory effort, no problems with respiration noted  Abdomen: Soft, gravid, appropriate for gestational age. Pain/Pressure: Absent     Pelvic:  Cervical exam deferred        Extremities: Normal range of motion.     ental Status: Normal mood and affect. Normal behavior. Normal judgment and thought content.     Assessment   28 y.o. G2P0010 at [redacted]w[redacted]d by  02/08/2020, by Last Menstrual Period presenting for routine prenatal visit  Plan   Pregnancy#2 Problems (from 05/04/19 to present)    Problem Noted Resolved   Supervision of normal first pregnancy, antepartum 11/23/2018 by Rod Can, CNM No   Overview Addendum 07/28/2019  7:53 AM by Malachy Mood, MD    [facility] Prenatal Labs  Dating LMP = 7 week Blood type: O/Positive/-- (08/21 1133)   Genetic Screen NIPS:Normal XX, Inheritest normal Antibody:Negative (08/21 1133)  Anatomic [place]  Rubella: 3.27 (08/21 1133) Varicella:  Immune  GTT Early:               Third trimester:  RPR: Non Reactive (08/21 1133)   Rhogam  HBsAg: Negative (08/21 1133)   TDaP vaccine   Flu Shot: declined HIV: Non Reactive (08/21 1133)   Baby Food   Breast                             GBS:   Contraception  Pap: 11/23/2018  CBB     CS/VBAC NA   Support Person Ovid Curd              Gestational age appropriate obstetric precautions including but not limited to vaginal bleeding, contractions, leaking of fluid and fetal movement were reviewed in detail with the patient.    Return in about 1 year (around 09/01/2020) for ROB staeler.  Malachy Mood, MD, [facility], [facility] 09/02/2019, 4:47 PM

## 2019-09-02 NOTE — Progress Notes (Signed)
ROB

## 2019-09-13 ENCOUNTER — Other Ambulatory Visit: Payer: Self-pay

## 2019-09-13 ENCOUNTER — Ambulatory Visit (INDEPENDENT_AMBULATORY_CARE_PROVIDER_SITE_OTHER): Payer: BC Managed Care – PPO | Admitting: Obstetrics and Gynecology

## 2019-09-13 ENCOUNTER — Telehealth: Payer: Self-pay

## 2019-09-13 VITALS — BP 128/68 | Wt 143.0 lb

## 2019-09-13 DIAGNOSIS — Z3A18 18 weeks gestation of pregnancy: Secondary | ICD-10-CM

## 2019-09-13 DIAGNOSIS — Z3400 Encounter for supervision of normal first pregnancy, unspecified trimester: Secondary | ICD-10-CM

## 2019-09-13 DIAGNOSIS — O26892 Other specified pregnancy related conditions, second trimester: Secondary | ICD-10-CM

## 2019-09-13 DIAGNOSIS — M545 Low back pain: Secondary | ICD-10-CM

## 2019-09-13 LAB — POCT URINALYSIS DIPSTICK
Bilirubin, UA: NEGATIVE
Blood, UA: NEGATIVE
Glucose, UA: NEGATIVE
Ketones, UA: NEGATIVE
Leukocytes, UA: NEGATIVE
Nitrite, UA: NEGATIVE
Protein, UA: POSITIVE — AB
Spec Grav, UA: 1.02 (ref 1.010–1.025)
Urobilinogen, UA: NEGATIVE E.U./dL — AB
pH, UA: 6 (ref 5.0–8.0)

## 2019-09-13 MED ORDER — CYCLOBENZAPRINE HCL 10 MG PO TABS: 10.0000 mg | ORAL_TABLET | Freq: Three times a day (TID) | ORAL | 2 refills | Status: DC | PRN

## 2019-09-13 NOTE — Telephone Encounter (Signed)
Patient is having severe lower back pain and vomiting (which isn't uncommon) that started this morning [phone]

## 2019-09-13 NOTE — Progress Notes (Signed)
ROB Low right back pain

## 2019-09-13 NOTE — Telephone Encounter (Signed)
Spoke w/patient. Advised needs to be seen to check for UTI. Pt states she can be here in 15 minutes. Apt scheduled for 11:10 w/AMS

## 2019-09-13 NOTE — Progress Notes (Signed)
Routine Prenatal Care Visit  Subjective  Natalie Welch is a 28 y.o. G2P0010 at [redacted]w[redacted]d being seen today for ongoing prenatal care.  She is currently monitored for the following issues for this low-risk pregnancy and has Supervision of normal first pregnancy, antepartum and Generalized anxiety disorder with panic attacks on their problem list.  ----------------------------------------------------------------------------------- Patient reports back pain, lower lumbar.  No dysuria, no hematuria, no fevers, no chills.   Contractions: Not present. Vag. Bleeding: None.  Movement: Present. Denies leaking of fluid.  ----------------------------------------------------------------------------------- The following portions of the patient's history were reviewed and updated as appropriate: allergies, current medications, past family history, past medical history, past social history, past surgical history and problem list. Problem list updated.   Objective  Blood pressure 128/68, weight 143 lb (64.9 kg), last menstrual period 05/04/2019. Pregravid weight 135 lb (61.2 kg) Total Weight Gain 8 lb (3.629 kg) Urinalysis:      Fetal Status: Fetal Heart Rate (bpm): 140   Movement: Present     General:  Alert, oriented and cooperative. Patient is in no acute distress.  Skin: Skin is warm and dry. No rash noted.   Cardiovascular: Normal heart rate noted  Respiratory: Normal respiratory effort, no problems with respiration noted  Abdomen: Soft, gravid, appropriate for gestational age. Pain/Pressure: Absent     Pelvic:  Cervical exam deferred        Extremities: Normal range of motion.     ental Status: Normal mood and affect. Normal behavior. Normal judgment and thought content.  MSK: reproducible paraspinal tenderness on right, no CVA tenderness  Results for orders placed or performed in visit on 09/13/19 (from the past 24 hour(s))  POCT Urinalysis Dipstick     Status: Abnormal   Collection Time:  09/13/19 11:21 AM  Result Value Ref Range   Color, UA Gold    Clarity, UA Cloudy    Glucose, UA Negative Negative   Bilirubin, UA Negative    Ketones, UA Negative    Spec Grav, UA 1.020 1.010 - 1.025   Blood, UA Negative    pH, UA 6.0 5.0 - 8.0   Protein, UA Positive (A) Negative   Urobilinogen, UA negative (A) 0.2 or 1.0 E.U./dL   Nitrite, UA Negative    Leukocytes, UA Negative Negative   Appearance     Odor       Assessment   28 y.o. G2P0010 at [redacted]w[redacted]d by  02/08/2020, by Last Menstrual Period presenting for work-in prenatal visit  Plan   Pregnancy#2 Problems (from 05/04/19 to present)    Problem Noted Resolved   Supervision of normal first pregnancy, antepartum 11/23/2018 by Rod Can, CNM No   Overview Addendum 07/28/2019  7:53 AM by Malachy Mood, MD    [facility] Prenatal Labs  Dating LMP = 7 week Blood type: O/Positive/-- (08/21 1133)   Genetic Screen NIPS:Normal XX, Inheritest normal Antibody:Negative (08/21 1133)  Anatomic [place]  Rubella: 3.27 (08/21 1133) Varicella:  Immune  GTT Early:               Third trimester:  RPR: Non Reactive (08/21 1133)   Rhogam  HBsAg: Negative (08/21 1133)   TDaP vaccine   Flu Shot: declined HIV: Non Reactive (08/21 1133)   Baby Food   Breast                             GBS:   Contraception  Pap: 11/23/2018  CBB     CS/VBAC NA   Support Person Harrold Donath              Gestational age appropriate obstetric precautions including but not limited to vaginal bleeding, contractions, leaking of fluid and fetal movement were reviewed in detail with the patient.    - Trial of flexeril  No follow-ups on file.  Vena Austria, MD, [facility], [facility] 09/13/2019, 11:33 AM

## 2019-09-14 ENCOUNTER — Encounter: Payer: BC Managed Care – PPO | Admitting: Obstetrics and Gynecology

## 2019-09-20 ENCOUNTER — Ambulatory Visit (INDEPENDENT_AMBULATORY_CARE_PROVIDER_SITE_OTHER): Payer: BC Managed Care – PPO | Admitting: Obstetrics and Gynecology

## 2019-09-20 ENCOUNTER — Ambulatory Visit (INDEPENDENT_AMBULATORY_CARE_PROVIDER_SITE_OTHER): Payer: BC Managed Care – PPO

## 2019-09-20 ENCOUNTER — Encounter: Payer: BC Managed Care – PPO | Admitting: Obstetrics and Gynecology

## 2019-09-20 ENCOUNTER — Other Ambulatory Visit: Payer: Self-pay

## 2019-09-20 ENCOUNTER — Other Ambulatory Visit: Payer: BC Managed Care – PPO

## 2019-09-20 VITALS — BP 124/68 | Wt 147.0 lb

## 2019-09-20 DIAGNOSIS — Z3400 Encounter for supervision of normal first pregnancy, unspecified trimester: Secondary | ICD-10-CM

## 2019-09-20 DIAGNOSIS — Z3A19 19 weeks gestation of pregnancy: Secondary | ICD-10-CM

## 2019-09-20 DIAGNOSIS — F419 Anxiety disorder, unspecified: Secondary | ICD-10-CM

## 2019-09-20 DIAGNOSIS — O99342 Other mental disorders complicating pregnancy, second trimester: Secondary | ICD-10-CM

## 2019-09-20 DIAGNOSIS — Z363 Encounter for antenatal screening for malformations: Secondary | ICD-10-CM | POA: Diagnosis not present

## 2019-09-20 NOTE — Progress Notes (Signed)
Routine Prenatal Care Visit  Subjective  Natalie Welch is a 28 y.o. G2P0010 at [redacted]w[redacted]d being seen today for ongoing prenatal care.  She is currently monitored for the following issues for this low-risk pregnancy and has Supervision of normal first pregnancy, antepartum and Generalized anxiety disorder with panic attacks on their problem list.  ----------------------------------------------------------------------------------- Patient reports no complaints.   Contractions: Not present. Vag. Bleeding: None.  Movement: Present. Denies leaking of fluid.  ----------------------------------------------------------------------------------- The following portions of the patient's history were reviewed and updated as appropriate: allergies, current medications, past family history, past medical history, past social history, past surgical history and problem list. Problem list updated.   Objective  Blood pressure 124/68, weight 147 lb (66.7 kg), last menstrual period 05/04/2019. Pregravid weight 135 lb (61.2 kg) Total Weight Gain 12 lb (5.443 kg) Urinalysis:      Fetal Status: Fetal Heart Rate (bpm): 145   Movement: Present     General:  Alert, oriented and cooperative. Patient is in no acute distress.  Skin: Skin is warm and dry. No rash noted.   Cardiovascular: Normal heart rate noted  Respiratory: Normal respiratory effort, no problems with respiration noted  Abdomen: Soft, gravid, appropriate for gestational age. Pain/Pressure: Absent     Pelvic:  Cervical exam deferred        Extremities: Normal range of motion.     ental Status: Normal mood and affect. Normal behavior. Normal judgment and thought content.   US Ob Comp + 14 Wk  Result Date: 09/20/2019 Patient Name: Natalie Welch DOB: 1991-05-26 MRN: [id number] ULTRASOUND REPORT Location: [facility] Date of Service: 09/20/2019 Indications:Anatomy Ultrasound Findings: Nelda Marseille intrauterine pregnancy is visualized with FHR at 152  BPM. Biometrics give an (U/S) Gestational age of [redacted]w[redacted]d and an (U/S) EDD of 02/02/2019; this correlates with the clinically established Estimated Date of Delivery: 02/08/20 Fetal presentation is Cephalic. EFW: 368 g ( 13 oz ). Placenta: anterior. Grade: 1 AFI: subjectively normal. Anatomic survey is complete and normal; Gender - female.  Impression: 1. [redacted]w[redacted]d Viable Singleton Intrauterine pregnancy by U/S. 2. (U/S) EDD is consistent with Clinically established Estimated Date of Delivery: 02/08/20 . 3. Normal Anatomy Scan Recommendations: 1.Clinical correlation with the patient's History and Physical Exam. Gweneth Dimitri, RT  There is a singleton gestation with subjectively normal amniotic fluid volume. The fetal biometry correlates with established dating. Detailed evaluation of the fetal anatomy was performed.The fetal anatomical survey appears within normal limits within the resolution of ultrasound as described above.  It must be noted that a normal ultrasound is unable to rule out fetal aneuploidy, subtle defects such as small ASD or VDS may also not be visible on imaging.  Malachy Mood, MD, [facility], [facility] 09/20/2019, 3:16 PM     Assessment   28 y.o. G2P0010 at [redacted]w[redacted]d by  02/08/2020, by Last Menstrual Period presenting for routine prenatal visit  Plan   Pregnancy#2 Problems (from 05/04/19 to present)    Problem Noted Resolved   Supervision of normal first pregnancy, antepartum 11/23/2018 by Rod Can, CNM No   Overview Addendum 07/28/2019  7:53 AM by Malachy Mood, MD    [facility] Prenatal Labs  Dating LMP = 7 week Blood type: O/Positive/-- (08/21 1133)   Genetic Screen NIPS:Normal XX, Inheritest normal Antibody:Negative (08/21 1133)  Anatomic [place] Normal 09/20/2019 Rubella: 3.27 (08/21 1133) Varicella:  Immune  GTT Early:  Third trimester:  RPR: Non Reactive (08/21 1133)   Rhogam  HBsAg: Negative (08/21 1133)   TDaP vaccine   Flu  Shot: declined HIV: Non Reactive (08/21 1133)   Baby Food   Breast                             GBS:   Contraception  Pap: 11/23/2018  CBB     CS/VBAC NA   Support Person Harrold Donath              Gestational age appropriate obstetric precautions including but not limited to vaginal bleeding, contractions, leaking of fluid and fetal movement were reviewed in detail with the patient.    Return in about 4 weeks (around 10/18/2019) for ROB.  Vena Austria, MD, [facility], [facility] 09/20/2019, 3:34 PM

## 2019-09-20 NOTE — Progress Notes (Signed)
ROB Anatomy scan/ It is a GIRL!! 

## 2019-10-14 ENCOUNTER — Other Ambulatory Visit: Payer: Self-pay

## 2019-10-14 ENCOUNTER — Ambulatory Visit (INDEPENDENT_AMBULATORY_CARE_PROVIDER_SITE_OTHER): Payer: BC Managed Care – PPO | Admitting: Obstetrics and Gynecology

## 2019-10-14 VITALS — BP 116/62 | Wt 151.0 lb

## 2019-10-14 DIAGNOSIS — Z3400 Encounter for supervision of normal first pregnancy, unspecified trimester: Secondary | ICD-10-CM

## 2019-10-14 DIAGNOSIS — Z3A23 23 weeks gestation of pregnancy: Secondary | ICD-10-CM

## 2019-10-14 DIAGNOSIS — Z3402 Encounter for supervision of normal first pregnancy, second trimester: Secondary | ICD-10-CM

## 2019-10-14 LAB — POCT URINALYSIS DIPSTICK OB
Glucose, UA: NEGATIVE
POC,PROTEIN,UA: NEGATIVE

## 2019-10-14 NOTE — Progress Notes (Signed)
ROB

## 2019-10-14 NOTE — Progress Notes (Signed)
    Routine Prenatal Care Visit  Subjective  Natalie Welch is a 28 y.o. G2P0010 at [redacted]w[redacted]d being seen today for ongoing prenatal care.  She is currently monitored for the following issues for this low-risk pregnancy and has Supervision of normal first pregnancy, antepartum and Generalized anxiety disorder with panic attacks on their problem list.  ----------------------------------------------------------------------------------- Patient reports no complaints.   Contractions: Not present. Vag. Bleeding: None.  Movement: Present. Denies leaking of fluid.  ----------------------------------------------------------------------------------- The following portions of the patient's history were reviewed and updated as appropriate: allergies, current medications, past family history, past medical history, past social history, past surgical history and problem list. Problem list updated.   Objective  Blood pressure 116/62, weight 151 lb (68.5 kg), last menstrual period 05/04/2019. Pregravid weight 135 lb (61.2 kg) Total Weight Gain 16 lb (7.258 kg) Urinalysis:      Fetal Status: Fetal Heart Rate (bpm): 140 Fundal Height: 23 cm Movement: Present     General:  Alert, oriented and cooperative. Patient is in no acute distress.  Skin: Skin is warm and dry. No rash noted.   Cardiovascular: Normal heart rate noted  Respiratory: Normal respiratory effort, no problems with respiration noted  Abdomen: Soft, gravid, appropriate for gestational age. Pain/Pressure: Absent     Pelvic:  Cervical exam deferred        Extremities: Normal range of motion.     ental Status: Normal mood and affect. Normal behavior. Normal judgment and thought content.     Assessment   28 y.o. G2P0010 at [redacted]w[redacted]d by  02/08/2020, by Last Menstrual Period presenting for routine prenatal visit  Plan   Pregnancy#2 Problems (from 05/04/19 to present)    Problem Noted Resolved   Supervision of normal first pregnancy, antepartum  11/23/2018 by Rod Can, CNM No   Overview Addendum 09/20/2019  3:34 PM by Malachy Mood, MD    [facility] Prenatal Labs  Dating LMP = 7 week Blood type: O/Positive/-- (08/21 1133)   Genetic Screen NIPS:Normal XX, Inheritest normal Antibody:Negative (08/21 1133)  Anatomic US Anatomy scan normal Rubella: 3.27 (08/21 1133) Varicella:  Immune  GTT Early:               Third trimester:  RPR: Non Reactive (08/21 1133)   Rhogam  HBsAg: Negative (08/21 1133)   TDaP vaccine   Flu Shot: declined HIV: Non Reactive (08/21 1133)   Baby Food   Breast                             GBS:   Contraception  Pap: 11/23/2018  CBB     CS/VBAC NA   Support Person Ovid Curd              Gestational age appropriate obstetric precautions including but not limited to vaginal bleeding, contractions, leaking of fluid and fetal movement were reviewed in detail with the patient.    Return in about 4 weeks (around 11/11/2019) for ROB and 28 week labs.  Malachy Mood, MD, [facility], [facility] 10/14/2019, 2:30 PM

## 2019-10-26 ENCOUNTER — Other Ambulatory Visit: Payer: Self-pay | Admitting: Obstetrics and Gynecology

## 2019-11-14 ENCOUNTER — Other Ambulatory Visit: Payer: Self-pay

## 2019-11-14 ENCOUNTER — Ambulatory Visit (INDEPENDENT_AMBULATORY_CARE_PROVIDER_SITE_OTHER): Payer: BC Managed Care – PPO | Admitting: Obstetrics and Gynecology

## 2019-11-14 ENCOUNTER — Other Ambulatory Visit: Payer: BC Managed Care – PPO

## 2019-11-14 VITALS — BP 128/70 | Wt 156.0 lb

## 2019-11-14 DIAGNOSIS — Z3400 Encounter for supervision of normal first pregnancy, unspecified trimester: Secondary | ICD-10-CM

## 2019-11-14 DIAGNOSIS — Z3402 Encounter for supervision of normal first pregnancy, second trimester: Secondary | ICD-10-CM

## 2019-11-14 DIAGNOSIS — Z3A27 27 weeks gestation of pregnancy: Secondary | ICD-10-CM

## 2019-11-14 DIAGNOSIS — Z3A23 23 weeks gestation of pregnancy: Secondary | ICD-10-CM

## 2019-11-14 LAB — POCT URINALYSIS DIPSTICK OB
Glucose, UA: NEGATIVE
POC,PROTEIN,UA: NEGATIVE

## 2019-11-14 MED ORDER — OMEPRAZOLE 20 MG PO CPDR: 20.0000 mg | DELAYED_RELEASE_CAPSULE | Freq: Every day | ORAL | 11 refills | Status: DC

## 2019-11-14 NOTE — Progress Notes (Signed)
    Routine Prenatal Care Visit  Subjective  Natalie Welch is a 29 y.o. G2P0010 at [redacted]w[redacted]d being seen today for ongoing prenatal care.  She is currently monitored for the following issues for this low-risk pregnancy and has Supervision of normal first pregnancy, antepartum and Generalized anxiety disorder with panic attacks on their problem list.  ----------------------------------------------------------------------------------- Patient reports heartburn.  Two episodes of dizzyness Contractions: Not present. Vag. Bleeding: None.  Movement: Present. Denies leaking of fluid.  ----------------------------------------------------------------------------------- The following portions of the patient's history were reviewed and updated as appropriate: allergies, current medications, past family history, past medical history, past social history, past surgical history and problem list. Problem list updated.   Objective  Blood pressure 128/70, weight 156 lb (70.8 kg), last menstrual period 05/04/2019. Pregravid weight 135 lb (61.2 kg) Total Weight Gain 21 lb (9.526 kg) Urinalysis:      Fetal Status: Fetal Heart Rate (bpm): 145 Fundal Height: 38 cm Movement: Present  Presentation: Complete Breech  General:  Alert, oriented and cooperative. Patient is in no acute distress.  Skin: Skin is warm and dry. No rash noted.   Cardiovascular: Normal heart rate noted  Respiratory: Normal respiratory effort, no problems with respiration noted  Abdomen: Soft, gravid, appropriate for gestational age. Pain/Pressure: Absent     Pelvic:  Cervical exam deferred        Extremities: Normal range of motion.     ental Status: Normal mood and affect. Normal behavior. Normal judgment and thought content.     Assessment   29 y.o. G2P0010 at [redacted]w[redacted]d by  02/08/2020, by Last Menstrual Period presenting for routine prenatal visit  Plan   Pregnancy#2 Problems (from 05/04/19 to present)    Problem Noted Resolved   Supervision of normal first pregnancy, antepartum 11/23/2018 by Tresea Mall, CNM No   Overview Addendum 09/20/2019  3:34 PM by Vena Austria, MD    [facility] Prenatal Labs  Dating LMP = 7 week Blood type: O/Positive/-- (08/21 1133)   Genetic Screen NIPS:Normal XX, Inheritest normal Antibody:Negative (08/21 1133)  Anatomic US Anatomy scan normal Rubella: 3.27 (08/21 1133) Varicella:  Immune  GTT Early:               Third trimester:  RPR: Non Reactive (08/21 1133)   Rhogam  HBsAg: Negative (08/21 1133)   TDaP vaccine   Flu Shot: declined HIV: Non Reactive (08/21 1133)   Baby Food   Breast                             GBS:   Contraception  Pap: 11/23/2018  CBB     CS/VBAC NA   Support Person Harrold Donath              Gestational age appropriate obstetric precautions including but not limited to vaginal bleeding, contractions, leaking of fluid and fetal movement were reviewed in detail with the patient.   1) Dizziness -  Patient with two episodes of dizziness. Discussed caval compression during the third trimester as well as hypoglycemia being most common etiologies and how to resolve.  2) GERD - Rx omeprazole  3) 28 week labs today  4) Return in about 2 weeks (around 11/28/2019) for ROB.  Return in about 2 weeks (around 11/28/2019) for ROB.  Vena Austria, MD, [facility], [facility] 11/14/2019, 8:59 AM

## 2019-11-14 NOTE — Progress Notes (Signed)
ROB Gtt today

## 2019-11-15 LAB — 28 WEEK RH+PANEL
Basophils Absolute: 0 10*3/uL (ref 0.0–0.2)
Basos: 0 %
EOS (ABSOLUTE): 0.1 10*3/uL (ref 0.0–0.4)
Eos: 1 %
Gestational Diabetes Screen: 142 mg/dL — ABNORMAL HIGH (ref 65–139)
HIV Screen 4th Generation wRfx: NONREACTIVE
Hematocrit: 27 % — ABNORMAL LOW (ref 34.0–46.6)
Hemoglobin: 9.3 g/dL — ABNORMAL LOW (ref 11.1–15.9)
Immature Grans (Abs): 0.1 10*3/uL (ref 0.0–0.1)
Immature Granulocytes: 1 %
Lymphocytes Absolute: 1.4 10*3/uL (ref 0.7–3.1)
Lymphs: 15 %
MCH: 29.8 pg (ref 26.6–33.0)
MCHC: 34.4 g/dL (ref 31.5–35.7)
MCV: 87 fL (ref 79–97)
Monocytes Absolute: 0.5 10*3/uL (ref 0.1–0.9)
Monocytes: 5 %
Neutrophils Absolute: 7.5 10*3/uL — ABNORMAL HIGH (ref 1.4–7.0)
Neutrophils: 78 %
Platelets: 234 10*3/uL (ref 150–450)
RBC: 3.12 x10E6/uL — ABNORMAL LOW (ref 3.77–5.28)
RDW: 13.1 % (ref 11.7–15.4)
RPR Ser Ql: NONREACTIVE
WBC: 9.6 10*3/uL (ref 3.4–10.8)

## 2019-11-16 ENCOUNTER — Other Ambulatory Visit: Payer: Self-pay | Admitting: Obstetrics and Gynecology

## 2019-11-16 DIAGNOSIS — O99810 Abnormal glucose complicating pregnancy: Secondary | ICD-10-CM

## 2019-11-16 DIAGNOSIS — O99019 Anemia complicating pregnancy, unspecified trimester: Secondary | ICD-10-CM

## 2019-11-16 DIAGNOSIS — Z3400 Encounter for supervision of normal first pregnancy, unspecified trimester: Secondary | ICD-10-CM

## 2019-11-16 MED ORDER — FERROUS SULFATE 325 (65 FE) MG PO TABS: 325.0000 mg | ORAL_TABLET | Freq: Every day | ORAL | 11 refills | Status: DC

## 2019-11-17 ENCOUNTER — Telehealth: Payer: Self-pay

## 2019-11-17 ENCOUNTER — Other Ambulatory Visit: Payer: Self-pay | Admitting: Advanced Practice Midwife

## 2019-11-17 DIAGNOSIS — O99019 Anemia complicating pregnancy, unspecified trimester: Secondary | ICD-10-CM

## 2019-11-17 DIAGNOSIS — D509 Iron deficiency anemia, unspecified: Secondary | ICD-10-CM

## 2019-11-17 MED ORDER — CVS SLOW RELEASE DRIED IRON 45 MG PO TBCR: 1.0000 | EXTENDED_RELEASE_TABLET | Freq: Every day | ORAL | 4 refills | Status: DC

## 2019-11-17 NOTE — Telephone Encounter (Signed)
Pt wanted to know if the iron pill that was sent to the pharmacy was slow released? If not could the Rx be switched to one that is, in the past the iron pill  makes her sick.

## 2019-11-17 NOTE — Telephone Encounter (Signed)
I sent a prescription for slow release iron and sent a MyChart message to the patient.

## 2019-11-17 NOTE — Telephone Encounter (Signed)
Please advise in AMS absence

## 2019-11-17 NOTE — Progress Notes (Unsigned)
Slow release Fe Rx sent per patient request. May not be covered by insurance.

## 2019-11-28 ENCOUNTER — Encounter: Payer: BC Managed Care – PPO | Admitting: Obstetrics and Gynecology

## 2019-12-06 ENCOUNTER — Other Ambulatory Visit: Payer: Self-pay

## 2019-12-06 ENCOUNTER — Ambulatory Visit (INDEPENDENT_AMBULATORY_CARE_PROVIDER_SITE_OTHER): Payer: BC Managed Care – PPO | Admitting: Certified Nurse Midwife

## 2019-12-06 VITALS — BP 120/80 | Temp 97.1°F | Wt 159.0 lb

## 2019-12-06 DIAGNOSIS — O26843 Uterine size-date discrepancy, third trimester: Secondary | ICD-10-CM

## 2019-12-06 DIAGNOSIS — Z3A30 30 weeks gestation of pregnancy: Secondary | ICD-10-CM

## 2019-12-06 DIAGNOSIS — Z3403 Encounter for supervision of normal first pregnancy, third trimester: Secondary | ICD-10-CM

## 2019-12-06 DIAGNOSIS — R7309 Other abnormal glucose: Secondary | ICD-10-CM | POA: Insufficient documentation

## 2019-12-06 DIAGNOSIS — Z3400 Encounter for supervision of normal first pregnancy, unspecified trimester: Secondary | ICD-10-CM

## 2019-12-06 LAB — POCT URINALYSIS DIPSTICK OB
Glucose, UA: NEGATIVE
POC,PROTEIN,UA: NEGATIVE

## 2019-12-06 MED ORDER — HYDROXYZINE HCL 25 MG PO TABS: 25.0000 mg | ORAL_TABLET | Freq: Four times a day (QID) | ORAL | 2 refills | Status: DC | PRN

## 2019-12-06 NOTE — Progress Notes (Signed)
ROB at 30wk6d: HAving more problems with panic attacks/ anxiety. Husband has been very ill with eosinophilic esophagitis, has lost a lot of weight and passed out last week. Patient also had 2 family members die in the recent past. Has a long history of anxiety and was on medication and used to see a therapist in [place].  Discussed getting a therapist and told about the [employer] on line. Will prescribe Atarax for panic attacks. Had an elevated 1 hour GTT of 142, but did not have her 3 hour GTT scheduled yet. Has been taking slow release iron for her anemia (9.3 gm/dl and 89%) FH 78ER FHT 841. BP 120/80  A: IUP ay 30wk6d with S>D Elevated 1 hour gtt Anxiety/panic attacks Anemia  P: Atarax 25 mgm #30-take one every 6 hours prn anxiety 3 Hour GTT ASAP Growth scan and ROB in 2 weeks Initially declined TDAP-discussed reasons for taking TDAP and patient to reconsider  Farrel Conners, CNM

## 2019-12-08 ENCOUNTER — Other Ambulatory Visit: Payer: BC Managed Care – PPO

## 2019-12-08 ENCOUNTER — Other Ambulatory Visit: Payer: Self-pay

## 2019-12-08 DIAGNOSIS — O99810 Abnormal glucose complicating pregnancy: Secondary | ICD-10-CM

## 2019-12-09 LAB — GESTATIONAL GLUCOSE TOLERANCE
Glucose, Fasting: 80 mg/dL (ref 65–94)
Glucose, GTT - 1 Hour: 169 mg/dL (ref 65–179)
Glucose, GTT - 2 Hour: 167 mg/dL — ABNORMAL HIGH (ref 65–154)
Glucose, GTT - 3 Hour: 110 mg/dL (ref 65–139)

## 2019-12-18 ENCOUNTER — Other Ambulatory Visit: Payer: Self-pay | Admitting: Obstetrics and Gynecology

## 2019-12-20 ENCOUNTER — Ambulatory Visit (INDEPENDENT_AMBULATORY_CARE_PROVIDER_SITE_OTHER): Payer: BC Managed Care – PPO

## 2019-12-20 ENCOUNTER — Other Ambulatory Visit: Payer: Self-pay

## 2019-12-20 ENCOUNTER — Ambulatory Visit (INDEPENDENT_AMBULATORY_CARE_PROVIDER_SITE_OTHER): Payer: BC Managed Care – PPO | Admitting: Certified Nurse Midwife

## 2019-12-20 VITALS — BP 120/70 | Wt 162.0 lb

## 2019-12-20 DIAGNOSIS — Z362 Encounter for other antenatal screening follow-up: Secondary | ICD-10-CM | POA: Diagnosis not present

## 2019-12-20 DIAGNOSIS — Z23 Encounter for immunization: Secondary | ICD-10-CM | POA: Diagnosis not present

## 2019-12-20 DIAGNOSIS — Z3A32 32 weeks gestation of pregnancy: Secondary | ICD-10-CM

## 2019-12-20 DIAGNOSIS — O219 Vomiting of pregnancy, unspecified: Secondary | ICD-10-CM

## 2019-12-20 DIAGNOSIS — Z3A30 30 weeks gestation of pregnancy: Secondary | ICD-10-CM

## 2019-12-20 DIAGNOSIS — O3663X0 Maternal care for excessive fetal growth, third trimester, not applicable or unspecified: Secondary | ICD-10-CM

## 2019-12-20 DIAGNOSIS — O403XX0 Polyhydramnios, third trimester, not applicable or unspecified: Secondary | ICD-10-CM

## 2019-12-20 DIAGNOSIS — Z3400 Encounter for supervision of normal first pregnancy, unspecified trimester: Secondary | ICD-10-CM

## 2019-12-20 DIAGNOSIS — R7309 Other abnormal glucose: Secondary | ICD-10-CM

## 2019-12-20 DIAGNOSIS — O26843 Uterine size-date discrepancy, third trimester: Secondary | ICD-10-CM

## 2019-12-20 LAB — POCT URINALYSIS DIPSTICK OB: Glucose, UA: NEGATIVE

## 2019-12-20 LAB — POCT URINALYSIS DIPSTICK
Bilirubin, UA: NEGATIVE
Blood, UA: NEGATIVE
Glucose, UA: NEGATIVE
Ketones, UA: NEGATIVE
Leukocytes, UA: NEGATIVE
Nitrite, UA: NEGATIVE
Protein, UA: POSITIVE — AB
Spec Grav, UA: 1.02 (ref 1.010–1.025)
Urobilinogen, UA: NEGATIVE E.U./dL — AB
pH, UA: 7 (ref 5.0–8.0)

## 2019-12-20 NOTE — Progress Notes (Signed)
C/o started vomiting again in the last two weeks p eating - projectile; TDAP today.rj

## 2019-12-21 DIAGNOSIS — O403XX0 Polyhydramnios, third trimester, not applicable or unspecified: Secondary | ICD-10-CM | POA: Insufficient documentation

## 2019-12-21 NOTE — Progress Notes (Signed)
ROB at 32wk 6days. Baby active. Vomits occasionally-usually after eating chicken? Has gained 3# in the last 2 weeks and TWG 27#.  Growth scan today: EFW 6# (84.8%) with AFI=25cm. Had an elevated 1 hour and there was 1 of 4 elevated values on her 3hr GTT (80/169/167/110) FH: 36.5cm, FHT 129, cephalic presentation  A: LGA baby with mild polyhydramnios  P: Repeat AFI at time of ROB in 2 weeks Repeat growth scan in 4 weeks TDAP today. BT consent signed.  Farrel Conners, CNM

## 2020-01-03 ENCOUNTER — Other Ambulatory Visit: Payer: Self-pay

## 2020-01-03 ENCOUNTER — Ambulatory Visit (INDEPENDENT_AMBULATORY_CARE_PROVIDER_SITE_OTHER): Payer: BC Managed Care – PPO | Admitting: Obstetrics and Gynecology

## 2020-01-03 ENCOUNTER — Ambulatory Visit (INDEPENDENT_AMBULATORY_CARE_PROVIDER_SITE_OTHER): Payer: BC Managed Care – PPO

## 2020-01-03 VITALS — BP 124/70 | Wt 166.0 lb

## 2020-01-03 DIAGNOSIS — O403XX0 Polyhydramnios, third trimester, not applicable or unspecified: Secondary | ICD-10-CM | POA: Diagnosis not present

## 2020-01-03 DIAGNOSIS — O09893 Supervision of other high risk pregnancies, third trimester: Secondary | ICD-10-CM

## 2020-01-03 DIAGNOSIS — Z3A34 34 weeks gestation of pregnancy: Secondary | ICD-10-CM

## 2020-01-03 DIAGNOSIS — Z3400 Encounter for supervision of normal first pregnancy, unspecified trimester: Secondary | ICD-10-CM

## 2020-01-03 DIAGNOSIS — R7309 Other abnormal glucose: Secondary | ICD-10-CM

## 2020-01-03 NOTE — Progress Notes (Signed)
ROB  AFI 

## 2020-01-03 NOTE — Progress Notes (Signed)
    Routine Prenatal Care Visit  Subjective  Natalie Welch is a 29 y.o. G2P0010 at [redacted]w[redacted]d being seen today for ongoing prenatal care.  She is currently monitored for the following issues for this low-risk pregnancy and has Supervision of normal first pregnancy, antepartum; Generalized anxiety disorder with panic attacks; Anemia during pregnancy; Elevated glucose tolerance test; and Polyhydramnios affecting pregnancy in third trimester on their problem list.  ----------------------------------------------------------------------------------- Patient reports no complaints.   Contractions: Irregular. Vag. Bleeding: None.  Movement: Present. Denies leaking of fluid.  ----------------------------------------------------------------------------------- The following portions of the patient's history were reviewed and updated as appropriate: allergies, current medications, past family history, past medical history, past social history, past surgical history and problem list. Problem list updated.   Objective  Blood pressure 124/70, weight 166 lb (75.3 kg), last menstrual period 05/04/2019. Pregravid weight 135 lb (61.2 kg) Total Weight Gain 31 lb (14.1 kg) Urinalysis:      Fetal Status: Fetal Heart Rate (bpm): 140 Fundal Height: 36 cm Movement: Present  Presentation: Vertex  General:  Alert, oriented and cooperative. Patient is in no acute distress.  Skin: Skin is warm and dry. No rash noted.   Cardiovascular: Normal heart rate noted  Respiratory: Normal respiratory effort, no problems with respiration noted  Abdomen: Soft, gravid, appropriate for gestational age. Pain/Pressure: Absent     Pelvic:  Cervical exam deferred        Extremities: Normal range of motion.     ental Status: Normal mood and affect. Normal behavior. Normal judgment and thought content.     Assessment   29 y.o. G2P0010 at [redacted]w[redacted]d by  02/08/2020, by Last Menstrual Period presenting for routine prenatal visit  Plan    Pregnancy#2 Problems (from 05/04/19 to present)    Problem Noted Resolved   Anemia during pregnancy 11/16/2019 by Vena Austria, MD No   Supervision of normal first pregnancy, antepartum 11/23/2018 by Tresea Mall, CNM No   Overview Addendum 12/21/2019  2:53 PM by Farrel Conners, CNM    Clinic [facility] Prenatal Labs  Dating LMP = 7 week Blood type: O/Positive/-- (08/21 1133)   Genetic Screen NIPS:Normal XX, Inheritest normal Antibody:Negative (08/21 1133)  Anatomic US Anatomy scan normal Rubella: 3.27 (08/21 1133) Varicella:  Immune  GTT 142 3-hr 80 / 169 / 167 / 110 RPR: Non Reactive (08/21 1133)   Rhogam N/A HBsAg: Negative (08/21 1133)   TDaP vaccine  12/20/19 Flu Shot: declined HIV: Non Reactive (08/21 1133)   Baby Food   Breast                             GBS:   Contraception  Pap: 11/23/2018  CBB     CS/VBAC NA   Support Person Harrold Donath              Gestational age appropriate obstetric precautions including but not limited to vaginal bleeding, contractions, leaking of fluid and fetal movement were reviewed in detail with the patient.    - AFI showing mild polyhydramnios. Growth scan in 2 weeks - no additional antepartum testing if remains mild  Return in about 2 weeks (around 01/17/2020) for ROB and growth scan.  Vena Austria, MD, [facility], [facility] 01/03/2020, 3:25 PM

## 2020-01-17 ENCOUNTER — Ambulatory Visit (INDEPENDENT_AMBULATORY_CARE_PROVIDER_SITE_OTHER): Payer: BC Managed Care – PPO

## 2020-01-17 ENCOUNTER — Ambulatory Visit (INDEPENDENT_AMBULATORY_CARE_PROVIDER_SITE_OTHER): Payer: BC Managed Care – PPO | Admitting: Obstetrics and Gynecology

## 2020-01-17 ENCOUNTER — Other Ambulatory Visit: Payer: Self-pay

## 2020-01-17 VITALS — BP 124/62 | Wt 166.0 lb

## 2020-01-17 DIAGNOSIS — Z3685 Encounter for antenatal screening for Streptococcus B: Secondary | ICD-10-CM | POA: Diagnosis not present

## 2020-01-17 DIAGNOSIS — O403XX0 Polyhydramnios, third trimester, not applicable or unspecified: Secondary | ICD-10-CM

## 2020-01-17 DIAGNOSIS — Z3A36 36 weeks gestation of pregnancy: Secondary | ICD-10-CM

## 2020-01-17 DIAGNOSIS — R7309 Other abnormal glucose: Secondary | ICD-10-CM | POA: Diagnosis not present

## 2020-01-17 DIAGNOSIS — O09893 Supervision of other high risk pregnancies, third trimester: Secondary | ICD-10-CM

## 2020-01-17 DIAGNOSIS — Z3400 Encounter for supervision of normal first pregnancy, unspecified trimester: Secondary | ICD-10-CM

## 2020-01-17 DIAGNOSIS — O3663X0 Maternal care for excessive fetal growth, third trimester, not applicable or unspecified: Secondary | ICD-10-CM

## 2020-01-17 DIAGNOSIS — Z3A34 34 weeks gestation of pregnancy: Secondary | ICD-10-CM

## 2020-01-17 LAB — POCT URINALYSIS DIPSTICK OB
Glucose, UA: NEGATIVE
POC,PROTEIN,UA: NEGATIVE

## 2020-01-17 LAB — GLUCOSE, POCT (MANUAL RESULT ENTRY): POC Glucose: 104 mg/dl — AB (ref 70–99)

## 2020-01-17 NOTE — Progress Notes (Signed)
ROB Growth scan today GBS

## 2020-01-17 NOTE — Progress Notes (Signed)
Routine Prenatal Care Visit  Subjective  Natalie Welch is a 29 y.o. G2P0010 at [redacted]w[redacted]d being seen today for ongoing prenatal care.  She is currently monitored for the following issues for this high-risk pregnancy and has Supervision of normal first pregnancy, antepartum; Generalized anxiety disorder with panic attacks; Anemia during pregnancy; Elevated glucose tolerance test; Polyhydramnios affecting pregnancy in third trimester; and Macrosomia affecting management of mother in third trimester on their problem list.  ----------------------------------------------------------------------------------- Patient reports no complaints.   Contractions: Not present. Vag. Bleeding: None.  Movement: Present. Denies leaking of fluid.  ----------------------------------------------------------------------------------- The following portions of the patient's history were reviewed and updated as appropriate: allergies, current medications, past family history, past medical history, past social history, past surgical history and problem list. Problem list updated.   Objective  Blood pressure 124/62, weight 166 lb (75.3 kg), last menstrual period 05/04/2019. Pregravid weight 135 lb (61.2 kg) Total Weight Gain 31 lb (14.1 kg)  Body mass index is 31.37 kg/m.  Urinalysis:      Fetal Status: Fetal Heart Rate (bpm): 150 Fundal Height: 37 cm Movement: Present  Presentation: Vertex  General:  Alert, oriented and cooperative. Patient is in no acute distress.  Skin: Skin is warm and dry. No rash noted.   Cardiovascular: Normal heart rate noted  Respiratory: Normal respiratory effort, no problems with respiration noted  Abdomen: Soft, gravid, appropriate for gestational age. Pain/Pressure: Absent     Pelvic:  Cervical exam deferred Dilation: Closed      Extremities: Normal range of motion.     ental Status: Normal mood and affect. Normal behavior. Normal judgment and thought content.   US OB  Limited  Result Date: 01/03/2020 Patient Name: Natalie Welch DOB: 04-06-1991 MRN: [id number] ULTRASOUND REPORT Location: [facility] Date of Service: 01/03/2020 Indications:AFI Findings: Mason Jim intrauterine pregnancy is visualized with FHR at 136 BPM. Fetal presentation is Cephalic. Placenta: anterior. Grade: 1 AFI: 27.8 cm Impression: 1. [redacted]w[redacted]d Viable Singleton Intrauterine pregnancy dated by previously established criteria. 2. AFI is 27.8 cm. Recommendations: 1.Clinical correlation with the patient's History and Physical Exam. Deanna Artis, RT There is a singleton gestation with mild polyhydramnios. The fetal biometry correlates with established dating.  Limited fetal anatomy was performed.The visualized fetal anatomical survey appears within normal limits within the resolution of ultrasound as described above.  It must be noted that a normal ultrasound is unable to rule out fetal aneuploidy.  Polyhydramnios is the increase in amniotic fluid volume around the fetus describes as single deepest vertical pocket (SDP) of 8cm or amniotic fluid index (AFI) of >24.0cm, and affects approximately 1-2% of singleton pregnancies.  Previous research studies have used an amniotic fluid index cut of of >25cm, however 24.0cm represents the 97.5%ile at all gestational ages.  Polyhydramnios is further subclassified as mild 24.0-29.9cm or SDP 8-11cm comprising 65-70% of cases, moderate AFI 30.0-34.9cm or SDP 12-15cm comprising 20% of cases, and severe AFI >35cm or SDP >16cm comprising <15% of cases.  Idiopathic polyhdramnios is associated with an increased risk of macrosomia, with birth weight >4000g in 15-30% of cases as opposed to 8% in the general population.  Isolated mild idiopathic polyhydramnios does not require additional antepartum surveillance, and delivery is not recommended prior to 39 weeks.  Cases of severe polyhydramnios should consider delivery at a tertiary care center given the high risk of associated  fetal anatomic anomalies (20-40%).  [facility] Consult Series #[id number]: Evaluation and management of polyhydramnios October 2018any additional antepartum fetal surveillance [place]  Bonney Aid, MD, [facility], [facility] 01/03/2020, 3:10 PM   US OB Follow Up  Result Date: 01/17/2020 Patient Name: Natalie Welch DOB: Dec 03, 1990 MRN: [id number] ULTRASOUND REPORT Location: [facility] Date of Service: 01/17/2020 Indications:growth/afi Findings: Mason Jim intrauterine pregnancy is visualized with FHR at 133 BPM. Biometrics give an (U/S) Gestational age of [redacted]w[redacted]d and an (U/S) EDD of 01/23/2020; this correlates with the clinically established Estimated Date of Delivery: 02/08/20. Fetal presentation is Cephalic. Placenta: anterior. Grade: 2 AFI: 24.3 cm Growth percentile is >90%.  AC percentile is >97.7%. EFW: 3703 g  ( 8 lb 3 oz) Impression: 1. [redacted]w[redacted]d Viable Singleton Intrauterine pregnancy previously established criteria. 2. Growth is >90 %ile.  AFI is 24.3 cm. Recommendations: 1.Clinical correlation with the patient's History and Physical Exam. Deanna Artis, RT There is a singleton gestation with mild polyhydramnios The fetal biometry is consistent with macrosomia  Limited fetal anatomy was performed.The visualized fetal anatomical survey appears within normal limits within the resolution of ultrasound as described above.  It must be noted that a normal ultrasound is unable to rule out fetal aneuploidy.  Polyhydramnios is the increase in amniotic fluid volume around the fetus describes as single deepest vertical pocket (SDP) of 8cm or amniotic fluid index (AFI) of >24.0cm, and affects approximately 1-2% of singleton pregnancies.  Previous research studies have used an amniotic fluid index cut of of >25cm, however 24.0cm represents the 97.5%ile at all gestational ages.  Polyhydramnios is further subclassified as mild 24.0-29.9cm or SDP 8-11cm comprising 65-70% of cases, moderate AFI 30.0-34.9cm or SDP  12-15cm comprising 20% of cases, and severe AFI >35cm or SDP >16cm comprising <15% of cases.  The majority of cases (60-70%) will be idiopathic, with the 2 most common pathologic causes attributable to maternal diabetes or fetal anomalies.  Idiopathic polyhdramnios is associated with an increased risk of macrosomia, with birth weight >4000g in 15-30% of cases as opposed to 8% in the general population.  The constellation of polyhydramnios and intrauterine growth restriction raises concern for trisomy 13 or 18.  Amnioreduction should be limited to patient with severe maternal discomfort, dyspnea, or both.  The use of indomethacin purely for the reduction of amniotic fluid volume is not recommended.  Isolated mild idiopathic polyhydramnios does not require additional antepartum surveillance, and delivery is not recommended prior to 39 weeks.  Cases of severe polyhydramnios should consider delivery at a tertiary care center given the high risk of associated fetal anatomic anomalies (20-40%).  [facility] Consult Series #46: Evaluation and management of polyhydramnios October 2018any additional antepartum fetal surveillance Vena Austria, MD, [facility], [facility] 01/17/2020, 3:07 PM   US OB Follow Up  Result Date: 12/20/2019 Patient Name: Natalie Welch DOB: January 30, 1991 MRN: [id number] ULTRASOUND REPORT Location: [facility] Date of Service: 12/20/2019 Indications:growth/afi Findings: Mason Jim intrauterine pregnancy is visualized with FHR at 149 BPM. Biometrics give an (U/S) Gestational age of [redacted]w[redacted]d and an (U/S) EDD of 01/20/2020; this correlates with the clinically established Estimated Date of Delivery: 02/08/20. Fetal presentation is Cephalic. Placenta: anterior. Grade: 2 AFI: 25.0 cm Growth percentile is 84.8%.  AC percentile is > 97.7%, BPD is greater than 97.7%. EFW: 2727 g  ( 6 lb 0 oz ) Impression: 1. [redacted]w[redacted]d Viable Singleton Intrauterine pregnancy previously established criteria. 2.  Growth is 84.8 %ile.  AFI is 25.0 cm. Recommendations: 1.Clinical correlation with the patient's History and Physical Exam. Deanna Artis, RT There is a singleton gestation with normal amniotic  fluid volume. The fetal biometry shows large for gestational age measurement as AC acceleration.  Limited fetal anatomy was performed.The visualized fetal anatomical survey appears within normal limits within the resolution of ultrasound as described above.  It must be noted that a normal ultrasound is unable to rule out fetal aneuploidy.  Malachy Mood, MD, [facility], [facility]     Assessment   29 y.o. G2P0010 at [redacted]w[redacted]d by  02/08/2020, by Last Menstrual Period presenting for routine prenatal visit  Plan   Pregnancy#2 Problems (from 05/04/19 to present)    Problem Noted Resolved   Macrosomia affecting management of mother in third trimester 01/17/2020 by Malachy Mood, MD No   Anemia during pregnancy 11/16/2019 by Malachy Mood, MD No   Supervision of normal first pregnancy, antepartum 11/23/2018 by Rod Can, CNM No   Overview Addendum 12/21/2019  2:53 PM by Dalia Heading, [place] Prenatal Labs  Dating LMP = 7 week Blood type: O/Positive/-- (08/21 1133)   Genetic Screen NIPS:Normal XX, Inheritest normal Antibody:Negative (08/21 1133)  Anatomic US Anatomy scan normal Rubella: 3.27 (08/21 1133) Varicella:  Immune  GTT 142 3-hr 80 / 169 / 167 / 110 RPR: Non Reactive (08/21 1133)   Rhogam N/A HBsAg: Negative (08/21 1133)   TDaP vaccine  12/20/19 Flu Shot: declined HIV: Non Reactive (08/21 1133)   Baby Food   Breast                             GBS:   Contraception  Pap: 11/23/2018  CBB     CS/VBAC NA   Support Person Ovid Curd              Gestational age appropriate obstetric precautions including but not limited to vaginal bleeding, contractions, leaking of fluid and fetal movement were reviewed in detail with the patient.  -  ultrasound reviewed stable mild polyhydramnios.  EFW consistent with macrosomia.  - Fingerstick glucose 104 today, total weight gain appropriate, fundal height appropriate     Return in about 1 week (around 01/24/2020) for ROB.  Malachy Mood, MD, [facility], [facility] 01/17/2020, 3:27 PM

## 2020-01-19 LAB — STREP GP B NAA: Strep Gp B NAA: NEGATIVE

## 2020-01-23 ENCOUNTER — Other Ambulatory Visit: Payer: Self-pay

## 2020-01-23 ENCOUNTER — Ambulatory Visit (INDEPENDENT_AMBULATORY_CARE_PROVIDER_SITE_OTHER): Payer: BC Managed Care – PPO | Admitting: Obstetrics and Gynecology

## 2020-01-23 VITALS — BP 136/88 | Wt 167.0 lb

## 2020-01-23 DIAGNOSIS — O403XX0 Polyhydramnios, third trimester, not applicable or unspecified: Secondary | ICD-10-CM

## 2020-01-23 DIAGNOSIS — O99013 Anemia complicating pregnancy, third trimester: Secondary | ICD-10-CM

## 2020-01-23 DIAGNOSIS — Z3A37 37 weeks gestation of pregnancy: Secondary | ICD-10-CM

## 2020-01-23 DIAGNOSIS — R7309 Other abnormal glucose: Secondary | ICD-10-CM

## 2020-01-23 DIAGNOSIS — O3663X0 Maternal care for excessive fetal growth, third trimester, not applicable or unspecified: Secondary | ICD-10-CM

## 2020-01-23 DIAGNOSIS — O3663X1 Maternal care for excessive fetal growth, third trimester, fetus 1: Secondary | ICD-10-CM | POA: Insufficient documentation

## 2020-01-23 DIAGNOSIS — O99019 Anemia complicating pregnancy, unspecified trimester: Secondary | ICD-10-CM

## 2020-01-23 DIAGNOSIS — Z3400 Encounter for supervision of normal first pregnancy, unspecified trimester: Secondary | ICD-10-CM

## 2020-01-23 LAB — POCT URINALYSIS DIPSTICK OB
Glucose, UA: NEGATIVE
POC,PROTEIN,UA: NEGATIVE

## 2020-01-23 NOTE — Progress Notes (Signed)
    Routine Prenatal Care Visit  Subjective  Natalie Welch is a 29 y.o. G2P0010 at [redacted]w[redacted]d being seen today for ongoing prenatal care.  She is currently monitored for the following issues for this low-risk pregnancy and has Supervision of normal first pregnancy, antepartum; Generalized anxiety disorder with panic attacks; Anemia during pregnancy; Elevated glucose tolerance test; Polyhydramnios affecting pregnancy in third trimester; Macrosomia affecting management of mother in third trimester; and Macrosomia affecting management of mother in third trimester, fetus 1 on their problem list.  ----------------------------------------------------------------------------------- Patient reports no complaints.   Contractions: Not present. Vag. Bleeding: None.  Movement: Present. Denies leaking of fluid.  ----------------------------------------------------------------------------------- The following portions of the patient's history were reviewed and updated as appropriate: allergies, current medications, past family history, past medical history, past social history, past surgical history and problem list. Problem list updated.   Objective  Blood pressure 136/88, weight 167 lb (75.8 kg), last menstrual period 05/04/2019. Pregravid weight 135 lb (61.2 kg) Total Weight Gain 32 lb (14.5 kg) Urinalysis:      Fetal Status: Fetal Heart Rate (bpm): 145 Fundal Height: 38 cm Movement: Present  Presentation: Vertex  General:  Alert, oriented and cooperative. Patient is in no acute distress.  Skin: Skin is warm and dry. No rash noted.   Cardiovascular: Normal heart rate noted  Respiratory: Normal respiratory effort, no problems with respiration noted  Abdomen: Soft, gravid, appropriate for gestational age. Pain/Pressure: Present     Pelvic:  Cervical exam deferred        Extremities: Normal range of motion.     ental Status: Normal mood and affect. Normal behavior. Normal judgment and thought content.      Assessment   29 y.o. G2P0010 at [redacted]w[redacted]d by  02/08/2020, by Last Menstrual Period presenting for routine prenatal visit  Plan   Pregnancy#2 Problems (from 05/04/19 to present)    Problem Noted Resolved   Macrosomia affecting management of mother in third trimester 01/17/2020 by Vena Austria, MD No   Anemia during pregnancy 11/16/2019 by Vena Austria, MD No   Supervision of normal first pregnancy, antepartum 11/23/2018 by Tresea Mall, CNM No   Overview Addendum 12/21/2019  2:53 PM by Farrel Conners, CNM    Clinic [facility] Prenatal Labs  Dating LMP = 7 week Blood type: O/Positive/-- (08/21 1133)   Genetic Screen NIPS:Normal XX, Inheritest normal Antibody:Negative (08/21 1133)  Anatomic US Anatomy scan normal Rubella: 3.27 (08/21 1133) Varicella:  Immune  GTT 142 3-hr 80 / 169 / 167 / 110 RPR: Non Reactive (08/21 1133)   Rhogam N/A HBsAg: Negative (08/21 1133)   TDaP vaccine  12/20/19 Flu Shot: declined HIV: Non Reactive (08/21 1133)   Baby Food   Breast                             GBS:   Contraception  Pap: 11/23/2018  CBB     CS/VBAC NA   Support Person Harrold Donath              Gestational age appropriate obstetric precautions including but not limited to vaginal bleeding, contractions, leaking of fluid and fetal movement were reviewed in detail with the patient.  IOL 02/06/2020 at 0800 orders placed  Return in about 1 week (around 01/30/2020) for ROB.  Vena Austria, MD, [facility], [facility] 01/23/2020, 12:01 PM

## 2020-01-23 NOTE — Progress Notes (Signed)
  [facility] INDUCTION ASSESSMENT SCHEDULING Natalie Welch Jan 24, 1991 Medical record #: [id number] Phone #:  Home Phone [phone]  Mobile [phone]    Prenatal Provider:[facility]ivering Group[facility] Proposed admission date/time:02/06/2020 0800 Method of induction:Cytotec  Weight: Filed Weights03/29/21 1132Weight:167 lb (75.8 kg) BMI Body mass index is 31.55 kg/m. HIV Negative HSV Negative EDC Estimated Date of Delivery: 4/14/21based on:LMP  Gestational age on admission: [redacted]w[redacted]d Gravidity/parity:G2P0010  Cervix Score   0 1 2 3   Position Posterior Midposition Anterior   Consistency Firm Medium Soft   Effacement (%) 0-30 40-50 60-70 >80  Dilation (cm) Closed 1-2 3-4 >5  Baby's station -3 -2 -1 +1, +2   Bishop Score:2   Medical induction of labor  select indication(s) below Elective induction ?39 weeks multiparous patient ?39 weeks primiparous patient with Bishop score ?7 ?40 weeks primiparous patient   Medical Indications Adapted from ACOG Committee Opinion #560, "Medically Indicated Late Preterm and Early Term Deliveries," 2013.  PLACENTAL / UTERINE ISSUES FETAL ISSUES MATERNAL ISSUES  ? Placenta previa (36.0-37.6) ? Isoimmunization (37.0-38.6) ? Preeclampsia without severe features or gestational HTN (37.0)  ? Suspected accreta (34.0-35.6) ? Growth Restriction 12-18-1984) ? Preeclampsia with severe features (34.0)  ? Prior classical CD, uterine window, rupture (36.0-37.6) ? Isolated (38.0-39.6) ? Chronic HTN (38.0-39.6)  ? Prior myomectomy (37.0-38.6) ? Concurrent findings (34.0-37.6) ? Cholestasis (37.0)  ? Umbilical vein varix (37.0) ? Growth Restriction (Twins) ? Diabetes  ? Placental abruption (chronic) ? Di-Di Isolated (36.0-37.6) ? Pregestational, controlled (39.0)  OBSTETRIC ISSUES ? Di-Di concurrent findings (32.0-34.6) ? Pregestational, uncontrolled (37.0-39.0)  ? Postdates ? (41 weeks) ? Mo-Di isolated (32.0-34.6) ? Pregestational,  vascular compromise (37.0- 39.0)  ? PPROM (34.0) ? Multiple Gestation ? Gestational, diet controlled (40.0)  ? Hx of IUFD (39.0 weeks) ? Di-Di (38.0-38.6) ? Gestational, med controlled (39.0)  ? Polyhydramnios, mild/moderate; SDV 8-16 or AFI 25-35 (39.0) ? Mo-Di (36.0-37.6) ? Gestational, uncontrolled (38.0-39.0)  ? Oligohydramnios (36.0-37.6); MVP <2 cm  For indications not listed above, delivery recommendations from maternal-fetal medicine consultant occurred on: N/A  Provider Signature: 01-13-1987 Scheduled by:AMS Date:01/23/2020 12:01 PM   Call [phone] to finalize the induction date/time  [phone] (07/17)

## 2020-01-23 NOTE — Progress Notes (Signed)
ROB

## 2020-01-31 ENCOUNTER — Other Ambulatory Visit: Payer: Self-pay

## 2020-01-31 ENCOUNTER — Ambulatory Visit (INDEPENDENT_AMBULATORY_CARE_PROVIDER_SITE_OTHER): Payer: BC Managed Care – PPO | Admitting: Advanced Practice Midwife

## 2020-01-31 ENCOUNTER — Encounter: Payer: Self-pay | Admitting: Advanced Practice Midwife

## 2020-01-31 VITALS — BP 122/84 | Wt 169.0 lb

## 2020-01-31 DIAGNOSIS — Z3403 Encounter for supervision of normal first pregnancy, third trimester: Secondary | ICD-10-CM

## 2020-01-31 DIAGNOSIS — Z3A38 38 weeks gestation of pregnancy: Secondary | ICD-10-CM

## 2020-01-31 NOTE — Progress Notes (Signed)
  Routine Prenatal Care Visit  Subjective  Natalie Welch is a 29 y.o. G2P0010 at [redacted]w[redacted]d being seen today for ongoing prenatal care.  She is currently monitored for the following issues for this low-risk pregnancy and has Supervision of normal first pregnancy, antepartum; Generalized anxiety disorder with panic attacks; Anemia during pregnancy; Elevated glucose tolerance test; Polyhydramnios affecting pregnancy in third trimester; Macrosomia affecting management of mother in third trimester; and Macrosomia affecting management of mother in third trimester, fetus 1 on their problem list.  ----------------------------------------------------------------------------------- Patient reports no complaints.  We discussed labor induction process. Labor and pain relief handouts given/discussed. Contractions: Not present. Vag. Bleeding: None.  Movement: Present. Leaking Fluid denies.  ----------------------------------------------------------------------------------- The following portions of the patient's history were reviewed and updated as appropriate: allergies, current medications, past family history, past medical history, past social history, past surgical history and problem list. Problem list updated.  Objective  Blood pressure 122/84, weight 169 lb (76.7 kg), last menstrual period 05/04/2019. Pregravid weight 135 lb (61.2 kg) Total Weight Gain 34 lb (15.4 kg) Urinalysis: Urine Protein    Urine Glucose    Fetal Status: Fetal Heart Rate (bpm): 137 Fundal Height: 40 cm Movement: Present     General:  Alert, oriented and cooperative. Patient is in no acute distress.  Skin: Skin is warm and dry. No rash noted.   Cardiovascular: Normal heart rate noted  Respiratory: Normal respiratory effort, no problems with respiration noted  Abdomen: Soft, gravid, appropriate for gestational age. Pain/Pressure: Present     Pelvic:  Cervical exam deferred        Extremities: Normal range of motion.  Edema: None   Mental Status: Normal mood and affect. Normal behavior. Normal judgment and thought content.   Assessment   29 y.o. G2P0010 at [redacted]w[redacted]d by  02/08/2020, by Last Menstrual Period presenting for routine prenatal visit  Plan   Pregnancy#2 Problems (from 05/04/19 to present)    Problem Noted Resolved   Macrosomia affecting management of mother in third trimester 01/17/2020 by Vena Austria, MD No   Anemia during pregnancy 11/16/2019 by Vena Austria, MD No   Supervision of normal first pregnancy, antepartum 11/23/2018 by Tresea Mall, CNM No   Overview Addendum 12/21/2019  2:53 PM by Farrel Conners, CNM    Clinic [facility] Prenatal Labs  Dating LMP = 7 week Blood type: O/Positive/-- (08/21 1133)   Genetic Screen NIPS:Normal XX, Inheritest normal Antibody:Negative (08/21 1133)  Anatomic US Anatomy scan normal Rubella: 3.27 (08/21 1133) Varicella:  Immune  GTT 142 3-hr 80 / 169 / 167 / 110 RPR: Non Reactive (08/21 1133)   Rhogam N/A HBsAg: Negative (08/21 1133)   TDaP vaccine  12/20/19 Flu Shot: declined HIV: Non Reactive (08/21 1133)   Baby Food   Breast                             GBS:   Contraception  Pap: 11/23/2018  CBB     CS/VBAC NA   Support Person Harrold Donath              Preterm labor symptoms and general obstetric precautions including but not limited to vaginal bleeding, contractions, leaking of fluid and fetal movement were reviewed in detail with the patient. Please refer to After Visit Summary for other counseling recommendations.   Covid swab 4/9 Return for iol on 4/12.  Tresea Mall, CNM 01/31/2020 9:35 AM

## 2020-01-31 NOTE — Progress Notes (Signed)
No vb. No lof.  

## 2020-01-31 NOTE — Patient Instructions (Signed)
Pain Relief During Labor and Delivery Many things can cause pain during labor and delivery, including:  Pressure on bones and ligaments due to the baby moving through the pelvis.  Stretching of tissues due to the baby moving through the birth canal.  Muscle tension due to anxiety or nervousness.  The uterus tightening (contracting) and relaxing to help move the baby. There are many ways to deal with the pain of labor and delivery. They include:  Taking prenatal classes. Taking these classes helps you know what to expect during your baby's birth. What you learn will increase your confidence and decrease your anxiety.  Practicing relaxation techniques or doing relaxing activities, such as: ? Focused breathing. ? Meditation. ? Visualization. ? Aroma therapy. ? Listening to your favorite music. ? Hypnosis.  Taking a warm shower or bath (hydrotherapy). This may: ? Provide comfort and relaxation. ? Lessen your perception of pain. ? Decrease the amount of pain medicine needed. ? Decrease the length of labor.  Getting a massage or counterpressure on your back.  Applying warm packs or ice packs.  Changing positions often, moving around, or using a birthing ball.  Getting: ? Pain medicine through an IV or injection into a muscle. ? Pain medicine inserted into your spinal column. ? Injections of sterile water just under the skin on your lower back (intradermal injections). ? Laughing gas (nitrous oxide). Discuss your pain control options with your health care provider during your prenatal visits. Explore the options offered by your hospital or birth center. What kinds of medicine are available? There are two kinds of medicines that can be used to relieve pain during labor and delivery:  Analgesics. These medicines decrease pain without causing you to lose feeling or the ability to move your muscles.  Anesthetics. These medicines block feeling in the body and can decrease your  ability to move freely. Both of these kinds of medicine can cause minor side effects, such as nausea, trouble concentrating, and sleepiness. They can also decrease the baby's heart rate before birth and affect the baby's breathing rate after birth. For this reason, health care providers are careful about when and how much medicine is given. What are specific medicines and procedures that provide pain relief? Local Anesthetics Local anesthetics are used to numb a small area of the body. They may be used along with another kind of anesthetic or used to numb the nerves of the vagina, cervix, and perineum during the second stage of labor. General Anesthetics General anesthetics cause you to lose consciousness so you do not feel pain. They are usually only used for an emergency cesarean delivery. General anesthetics are given through an IV tube and a mask. Pudendal Block A pudendal block is a form of local anesthetic. It may be used to relieve the pain associated with pushing or stretching of the perineum at the time of delivery or to further numb the perineum. A pudendal block is done by injecting numbing medicine through the vaginal wall into a nerve in the pelvis. Epidural Analgesia Epidural analgesia is given through a flexible IV catheter that is inserted into the lower back. Numbing medicine is delivered continuously to the area near your spinal column nerves (epidural space). After having this type of analgesia, you may be able to move your legs but you most likely will not be able to walk. Depending on the amount of medicine given, you may lose all feeling in the lower half of your body, or you may retain some level   of sensation, including the urge to push. Epidural analgesia can be used to provide pain relief for a vaginal birth. Spinal Block A spinal block is similar to epidural analgesia, but the medicine is injected into the spinal fluid instead of the epidural space. A spinal block is only given  once. It starts to relieve pain quickly, but the pain relief lasts only 1-6 hours. Spinal blocks can be used for cesarean deliveries. Combined Spinal-Epidural (CSE) Block A CSE block combines the effects of a spinal block and epidural analgesia. The spinal block works quickly to block all pain. The epidural analgesia provides continuous pain relief, even after the effects of the spinal block have worn off. This information is not intended to replace advice given to you by your health care provider. Make sure you discuss any questions you have with your health care provider. Document Revised: 09/25/2017 Document Reviewed: 03/05/2016 Elsevier Patient Education  2020 [employer] Vaginal Delivery  Vaginal delivery means that you give birth by pushing your baby out of your birth canal (vagina). A team of health care providers will help you before, during, and after vaginal delivery. Birth experiences are unique for every woman and every pregnancy, and birth experiences vary depending on where you choose to give birth. What happens when I arrive at the birth center or hospital? Once you are in labor and have been admitted into the hospital or birth center, your health care provider may:  Review your pregnancy history and any concerns that you have.  Insert an IV into one of your veins. This may be used to give you fluids and medicines.  Check your blood pressure, pulse, temperature, and heart rate (vital signs).  Check whether your bag of water (amniotic sac) has broken (ruptured).  Talk with you about your birth plan and discuss pain control options. Monitoring Your health care provider may monitor your contractions (uterine monitoring) and your baby's heart rate (fetal monitoring). You may need to be monitored:  Often, but not continuously (intermittently).  All the time or for long periods at a time (continuously). Continuous monitoring may be needed if: ? You are taking certain medicines,  such as medicine to relieve pain or make your contractions stronger. ? You have pregnancy or labor complications. Monitoring may be done by:  Placing a special stethoscope or a handheld monitoring device on your abdomen to check your baby's heartbeat and to check for contractions.  Placing monitors on your abdomen (external monitors) to record your baby's heartbeat and the frequency and length of contractions.  Placing monitors inside your uterus through your vagina (internal monitors) to record your baby's heartbeat and the frequency, length, and strength of your contractions. Depending on the type of monitor, it may remain in your uterus or on your baby's head until birth.  Telemetry. This is a type of continuous monitoring that can be done with external or internal monitors. Instead of having to stay in bed, you are able to move around during telemetry. Physical exam Your health care provider may perform frequent physical exams. This may include:  Checking how and where your baby is positioned in your uterus.  Checking your cervix to determine: ? Whether it is thinning out (effacing). ? Whether it is opening up (dilating). What happens during labor and delivery?  Normal labor and delivery is divided into the following three stages: Stage 1  This is the longest stage of labor.  This stage can last for hours or days.  Throughout this stage,   you will feel contractions. Contractions generally feel mild, infrequent, and irregular at first. They get stronger, more frequent (about every 2-3 minutes), and more regular as you move through this stage.  This stage ends when your cervix is completely dilated to 4 inches (10 cm) and completely effaced. Stage 2  This stage starts once your cervix is completely effaced and dilated and lasts until the delivery of your baby.  This stage may last from 20 minutes to 2 hours.  This is the stage where you will feel an urge to push your baby out of  your vagina.  You may feel stretching and burning pain, especially when the widest part of your baby's head passes through the vaginal opening (crowning).  Once your baby is delivered, the umbilical cord will be clamped and cut. This usually occurs after waiting a period of 1-2 minutes after delivery.  Your baby will be placed on your bare chest (skin-to-skin contact) in an upright position and covered with a warm blanket. Watch your baby for feeding cues, like rooting or sucking, and help the baby to your breast for his or her first feeding. Stage 3  This stage starts immediately after the birth of your baby and ends after you deliver the placenta.  This stage may take anywhere from 5 to 30 minutes.  After your baby has been delivered, you will feel contractions as your body expels the placenta and your uterus contracts to control bleeding. What can I expect after labor and delivery?  After labor is over, you and your baby will be monitored closely until you are ready to go home to ensure that you are both healthy. Your health care team will teach you how to care for yourself and your baby.  You and your baby will stay in the same room (rooming in) during your hospital stay. This will encourage early bonding and successful breastfeeding.  You may continue to receive fluids and medicines through an IV.  Your uterus will be checked and massaged regularly (fundal massage).  You will have some soreness and pain in your abdomen, vagina, and the area of skin between your vaginal opening and your anus (perineum).  If an incision was made near your vagina (episiotomy) or if you had some vaginal tearing during delivery, cold compresses may be placed on your episiotomy or your tear. This helps to reduce pain and swelling.  You may be given a squirt bottle to use instead of wiping when you go to the bathroom. To use the squirt bottle, follow these steps: ? Before you urinate, fill the squirt  bottle with warm water. Do not use hot water. ? After you urinate, while you are sitting on the toilet, use the squirt bottle to rinse the area around your urethra and vaginal opening. This rinses away any urine and blood. ? Fill the squirt bottle with clean water every time you use the bathroom.  It is normal to have vaginal bleeding after delivery. Wear a sanitary pad for vaginal bleeding and discharge. Summary  Vaginal delivery means that you will give birth by pushing your baby out of your birth canal (vagina).  Your health care provider may monitor your contractions (uterine monitoring) and your baby's heart rate (fetal monitoring).  Your health care provider may perform a physical exam.  Normal labor and delivery is divided into three stages.  After labor is over, you and your baby will be monitored closely until you are ready to go home. This information   is not intended to replace advice given to you by your health care provider. Make sure you discuss any questions you have with your health care provider. Document Revised: 11/17/2017 Document Reviewed: 11/17/2017 Elsevier Patient Education  2020 Elsevier Inc.  

## 2020-02-02 ENCOUNTER — Other Ambulatory Visit: Payer: Self-pay

## 2020-02-03 ENCOUNTER — Other Ambulatory Visit
Admission: RE | Admit: 2020-02-03 | Discharge: 2020-02-03 | Disposition: A | Discharge status: Home / Self Care | Payer: BC Managed Care – PPO | Source: Ambulatory Visit | Attending: Obstetrics and Gynecology | Admitting: Obstetrics and Gynecology

## 2020-02-03 DIAGNOSIS — Z20822 Contact with and (suspected) exposure to covid-19: Secondary | ICD-10-CM | POA: Diagnosis not present

## 2020-02-03 DIAGNOSIS — Z01812 Encounter for preprocedural laboratory examination: Secondary | ICD-10-CM | POA: Insufficient documentation

## 2020-02-03 LAB — SARS CORONAVIRUS 2 (TAT 6-24 HRS): SARS Coronavirus 2: NEGATIVE

## 2020-02-06 ENCOUNTER — Inpatient Hospital Stay
Admission: EM | Admit: 2020-02-06 | Discharge: 2020-02-09 | DRG: 787 | Disposition: A | Discharge status: Home / Self Care | Payer: BC Managed Care – PPO | Attending: Certified Nurse Midwife | Admitting: Certified Nurse Midwife

## 2020-02-06 ENCOUNTER — Inpatient Hospital Stay: Payer: BC Managed Care – PPO | Admitting: Anesthesiology

## 2020-02-06 ENCOUNTER — Other Ambulatory Visit: Payer: Self-pay

## 2020-02-06 ENCOUNTER — Encounter: Payer: Self-pay | Admitting: Obstetrics and Gynecology

## 2020-02-06 DIAGNOSIS — D62 Acute posthemorrhagic anemia: Secondary | ICD-10-CM | POA: Diagnosis not present

## 2020-02-06 DIAGNOSIS — O26893 Other specified pregnancy related conditions, third trimester: Secondary | ICD-10-CM | POA: Diagnosis not present

## 2020-02-06 DIAGNOSIS — O403XX0 Polyhydramnios, third trimester, not applicable or unspecified: Secondary | ICD-10-CM | POA: Diagnosis present

## 2020-02-06 DIAGNOSIS — Z3400 Encounter for supervision of normal first pregnancy, unspecified trimester: Secondary | ICD-10-CM

## 2020-02-06 DIAGNOSIS — O3663X0 Maternal care for excessive fetal growth, third trimester, not applicable or unspecified: Secondary | ICD-10-CM | POA: Diagnosis not present

## 2020-02-06 DIAGNOSIS — Z3A39 39 weeks gestation of pregnancy: Secondary | ICD-10-CM

## 2020-02-06 DIAGNOSIS — O9902 Anemia complicating childbirth: Secondary | ICD-10-CM | POA: Diagnosis not present

## 2020-02-06 DIAGNOSIS — Z98891 History of uterine scar from previous surgery: Secondary | ICD-10-CM

## 2020-02-06 DIAGNOSIS — O134 Gestational [pregnancy-induced] hypertension without significant proteinuria, complicating childbirth: Secondary | ICD-10-CM | POA: Diagnosis present

## 2020-02-06 DIAGNOSIS — O0993 Supervision of high risk pregnancy, unspecified, third trimester: Secondary | ICD-10-CM

## 2020-02-06 DIAGNOSIS — O9081 Anemia of the puerperium: Secondary | ICD-10-CM | POA: Diagnosis not present

## 2020-02-06 DIAGNOSIS — O99019 Anemia complicating pregnancy, unspecified trimester: Secondary | ICD-10-CM | POA: Diagnosis present

## 2020-02-06 DIAGNOSIS — Z23 Encounter for immunization: Secondary | ICD-10-CM | POA: Diagnosis not present

## 2020-02-06 DIAGNOSIS — O139 Gestational [pregnancy-induced] hypertension without significant proteinuria, unspecified trimester: Secondary | ICD-10-CM | POA: Diagnosis present

## 2020-02-06 HISTORY — DX: Anemia, unspecified: D64.9

## 2020-02-06 LAB — COMPREHENSIVE METABOLIC PANEL
ALT: 13 U/L (ref 0–44)
AST: 23 U/L (ref 15–41)
Albumin: 2.9 g/dL — ABNORMAL LOW (ref 3.5–5.0)
Alkaline Phosphatase: 120 U/L (ref 38–126)
Anion gap: 8 (ref 5–15)
BUN: 7 mg/dL (ref 6–20)
CO2: 21 mmol/L — ABNORMAL LOW (ref 22–32)
Calcium: 8.7 mg/dL — ABNORMAL LOW (ref 8.9–10.3)
Chloride: 107 mmol/L (ref 98–111)
Creatinine, Ser: 0.55 mg/dL (ref 0.44–1.00)
GFR calc Af Amer: >60 mL/min (ref >60)
GFR calc non Af Amer: >60 mL/min (ref >60)
Glucose, Bld: 82 mg/dL (ref 70–99)
Potassium: 3.4 mmol/L — ABNORMAL LOW (ref 3.5–5.1)
Sodium: 136 mmol/L (ref 135–145)
Total Bilirubin: 0.3 mg/dL (ref 0.3–1.2)
Total Protein: 6.2 g/dL — ABNORMAL LOW (ref 6.5–8.1)

## 2020-02-06 LAB — CBC
HCT: 36.7 % (ref 36.0–46.0)
Hemoglobin: 12 g/dL (ref 12.0–15.0)
MCH: 28.2 pg (ref 26.0–34.0)
MCHC: 32.7 g/dL (ref 30.0–36.0)
MCV: 86.4 fL (ref 80.0–100.0)
Platelets: 176 10*3/uL (ref 150–400)
RBC: 4.25 MIL/uL (ref 3.87–5.11)
RDW: 14.6 % (ref 11.5–15.5)
WBC: 9.7 10*3/uL (ref 4.0–10.5)
nRBC: 0 % (ref 0.0–0.2)

## 2020-02-06 LAB — PROTEIN / CREATININE RATIO, URINE
Creatinine, Urine: 124 mg/dL
Protein Creatinine Ratio: 0.23 mg/mg{Cre} — ABNORMAL HIGH (ref 0.00–0.15)
Total Protein, Urine: 28 mg/dL

## 2020-02-06 MED ORDER — OXYTOCIN 40 UNITS IN NORMAL SALINE INFUSION - SIMPLE MED
2.5000 [IU]/h | INTRAVENOUS | Status: DC
  Filled 2020-02-06: qty 1000

## 2020-02-06 MED ORDER — PHENYLEPHRINE 40 MCG/ML (10ML) SYRINGE FOR IV PUSH (FOR BLOOD PRESSURE SUPPORT): 80.0000 ug | PREFILLED_SYRINGE | INTRAVENOUS | Status: DC | PRN

## 2020-02-06 MED ORDER — TERBUTALINE SULFATE 1 MG/ML IJ SOLN: 0.2500 mg | Freq: Once | INTRAMUSCULAR | Status: DC | PRN

## 2020-02-06 MED ORDER — LIDOCAINE-EPINEPHRINE (PF) 1.5 %-1:200000 IJ SOLN
INTRAMUSCULAR | Status: DC | PRN
  Administered 2020-02-06: 3 mL via PERINEURAL

## 2020-02-06 MED ORDER — SOD CITRATE-CITRIC ACID 500-334 MG/5ML PO SOLN
30.0000 mL | ORAL | Status: DC | PRN
  Filled 2020-02-06: qty 30

## 2020-02-06 MED ORDER — EPHEDRINE 5 MG/ML INJ: 10.0000 mg | INTRAVENOUS | Status: DC | PRN

## 2020-02-06 MED ORDER — LACTATED RINGERS IV SOLN: 500.0000 mL | Freq: Once | INTRAVENOUS | Status: DC

## 2020-02-06 MED ORDER — MISOPROSTOL 200 MCG PO TABS
ORAL_TABLET | ORAL | Status: AC
  Filled 2020-02-06: qty 4

## 2020-02-06 MED ORDER — BUTORPHANOL TARTRATE 1 MG/ML IJ SOLN
1.0000 mg | INTRAMUSCULAR | Status: DC | PRN
  Administered 2020-02-06: 1 mg via INTRAVENOUS
  Filled 2020-02-06: qty 1

## 2020-02-06 MED ORDER — BUPIVACAINE HCL (PF) 0.25 % IJ SOLN
INTRAMUSCULAR | Status: DC | PRN
  Administered 2020-02-06: 7 mL via EPIDURAL

## 2020-02-06 MED ORDER — ONDANSETRON HCL 4 MG/2ML IJ SOLN
4.0000 mg | Freq: Four times a day (QID) | INTRAMUSCULAR | Status: DC | PRN
  Administered 2020-02-06 – 2020-02-07 (×2): 4 mg via INTRAVENOUS
  Filled 2020-02-06 (×2): qty 2

## 2020-02-06 MED ORDER — AMMONIA AROMATIC IN INHA
RESPIRATORY_TRACT | Status: AC
  Filled 2020-02-06: qty 10

## 2020-02-06 MED ORDER — LACTATED RINGERS IV SOLN: 500.0000 mL | INTRAVENOUS | Status: DC | PRN

## 2020-02-06 MED ORDER — MISOPROSTOL 25 MCG QUARTER TABLET
25.0000 ug | ORAL_TABLET | ORAL | Status: DC | PRN
  Administered 2020-02-06 (×2): 25 ug via VAGINAL
  Filled 2020-02-06 (×2): qty 1

## 2020-02-06 MED ORDER — DIPHENHYDRAMINE HCL 50 MG/ML IJ SOLN: 12.5000 mg | INTRAMUSCULAR | Status: DC | PRN

## 2020-02-06 MED ORDER — ACETAMINOPHEN 325 MG PO TABS: 650.0000 mg | ORAL_TABLET | ORAL | Status: DC | PRN

## 2020-02-06 MED ORDER — FENTANYL 2.5 MCG/ML W/ROPIVACAINE 0.15% IN NS 100 ML EPIDURAL (ARMC)
EPIDURAL | Status: AC
  Filled 2020-02-06: qty 100

## 2020-02-06 MED ORDER — OXYTOCIN 10 UNIT/ML IJ SOLN
INTRAMUSCULAR | Status: AC
  Filled 2020-02-06: qty 2

## 2020-02-06 MED ORDER — LIDOCAINE HCL (PF) 1 % IJ SOLN
30.0000 mL | INTRAMUSCULAR | Status: AC | PRN
  Administered 2020-02-06: 3 mL via SUBCUTANEOUS
  Filled 2020-02-06: qty 30

## 2020-02-06 MED ORDER — FENTANYL 2.5 MCG/ML W/ROPIVACAINE 0.15% IN NS 100 ML EPIDURAL (ARMC)
12.0000 mL/h | EPIDURAL | Status: DC
  Administered 2020-02-06: 12 mL/h via EPIDURAL
  Filled 2020-02-06: qty 100

## 2020-02-06 MED ORDER — LACTATED RINGERS IV SOLN: INTRAVENOUS | Status: DC

## 2020-02-06 MED ORDER — OXYTOCIN BOLUS FROM INFUSION: 500.0000 mL | Freq: Once | INTRAVENOUS | Status: DC

## 2020-02-06 MED ORDER — OXYTOCIN 40 UNITS IN NORMAL SALINE INFUSION - SIMPLE MED
1.0000 m[IU]/min | INTRAVENOUS | Status: DC
  Administered 2020-02-06: 23:00:00 2 m[IU]/min via INTRAVENOUS

## 2020-02-06 NOTE — Anesthesia Procedure Notes (Signed)
Epidural Patient location during procedure: OB Start time: 02/06/2020 9:43 PM End time: 02/06/2020 9:59 PM  Staffing Anesthesiologist: Yves Dill, MD Performed: anesthesiologist   Preanesthetic Checklist Completed: patient identified, IV checked, site marked, risks and benefits discussed, surgical consent, monitors and equipment checked, pre-op evaluation and timeout performed  Epidural Patient position: sitting Prep: Betadine Patient monitoring: heart rate, continuous pulse ox and blood pressure Approach: midline Location: L3-L4 Injection technique: LOR air  Needle:  Needle type: Tuohy  Needle gauge: 17 G Needle length: 9 cm and 9 Needle insertion depth: 6 cm Catheter type: closed end flexible Catheter size: 19 Gauge Test dose: negative and 1.5% lidocaine with Epi 1:200 K  Assessment Events: blood not aspirated, injection not painful, no injection resistance, no paresthesia and negative IV test  Additional Notes Time out calloed.  Patient placed in sitting position.  Back prepped and draped in sterile fashion.  A skin wheal was made in the L3-L4 interspace with 1% Lidocaine plain.  A 17G Tuohy needle was advanced to the epidural space by a loss of resistance technique.  The epidural catheter was threaded 3cm and the TD was negative.  The patient tolerated the procedure well and the catheter was affrixed to the back in sterile fashion.Reason for block:procedure for pain

## 2020-02-06 NOTE — Progress Notes (Signed)
L&D progress Note     S: Comfortable after epidural.    O: BP 124/73   Pulse 74   Temp 98.1 F (36.7 C) (Oral)   Resp 18   Ht 5\' 1"  (1.549 m)   Wt 76.7 kg   LMP 05/04/2019   SpO2 99%   BMI 31.95 kg/m    Patient Vitals for the past 24 hrs:  BP Temp Temp src Pulse Resp SpO2 Height Weight  02/06/20 2237 124/73 -- -- 74 -- -- -- --  02/06/20 2222 119/62 -- -- 63 -- -- -- --  02/06/20 2207 126/74 -- -- 72 -- -- -- --  02/06/20 2202 136/64 -- -- 85 -- -- -- --  02/06/20 2200 -- -- -- -- -- 99 % -- --  02/06/20 2157 (!) 145/76 -- -- 81 -- -- -- --  02/06/20 2152 139/79 -- -- 84 -- -- -- --  02/06/20 2150 (!) 148/92 -- -- (!) 114 -- -- -- --  02/06/20 2126 (!) 143/73 -- -- 76 -- -- -- --  02/06/20 2025 (!) 155/81 -- -- 69 -- -- -- --  02/06/20 1925 (!) 144/87 98.1 F (36.7 C) Oral 70 18 -- -- --  02/06/20 1841 (!) 141/80 -- -- 63 -- -- -- --  02/06/20 1710 (!) 146/77 98.8 F (37.1 C) Oral 71 16 -- -- --  02/06/20 1213 127/67 98 F (36.7 C) Oral 86 18 -- -- --  02/06/20 0831 137/82 98.4 F (36.9 C) Oral 70 16 -- 5\' 1"  (1.549 m) 76.7 kg  LFTs, BUN, cr, plt, and PC ratio were all WNL.  FHR: 115-120 baseline with accelerations to 140s, moderate variability? Late decelerations-after epidural, just turned to right tilt.  Toco: Contractions have spaced out to every 4-5 minutes apart   AROM at 2112-pink tinged clear fluid  Cervix 4/60-70%/-1 to -2 since about 1912  A: Little progress since 1912 Probable gestational hypertension  Normotensive since epidural Reassuring FH tracing  P: Pitocin augmentation   1913, CNM

## 2020-02-06 NOTE — Progress Notes (Signed)
   Subjective:  Feeling contractions but not painful  Objective:   Vitals: Blood pressure (!) 146/77, pulse 71, temperature 98.8 F (37.1 C), temperature source Oral, resp. rate 16, height 5\' 1"  (1.549 m), weight 76.7 kg, last menstrual period 05/04/2019. General: NAD Abdomen: gravid, non-tender Cervical Exam:  Dilation: 1 Effacement (%): 40, 50 Cervical Position: Posterior Station: -2 Presentation: Vertex Exam by:: A.Sandar Krinke,MD  FHT: 150, moderate, +accels, no decels Toco: q1-87min  Results for orders placed or performed during the hospital encounter of 02/06/20 (from the past 24 hour(s))  CBC     Status: None   Collection Time: 02/06/20  8:30 AM  Result Value Ref Range   WBC 9.7 4.0 - 10.5 K/uL   RBC 4.25 3.87 - 5.11 MIL/uL   Hemoglobin 12.0 12.0 - 15.0 g/dL   HCT 04/07/20 10.0 - 71.2 %   MCV 86.4 80.0 - 100.0 fL   MCH 28.2 26.0 - 34.0 pg   MCHC 32.7 30.0 - 36.0 g/dL   RDW 19.7 58.8 - 32.5 %   Platelets 176 150 - 400 K/uL   nRBC 0.0 0.0 - 0.2 %  Type and screen     Status: None   Collection Time: 02/06/20  8:30 AM  Result Value Ref Range   ABO/RH(D) O POS    Antibody Screen NEG    Sample Expiration      02/09/2020,2359 Performed at [facility], [address], [place], [place]     Assessment:   29 y.o. G2P0010 [redacted]w[redacted]d IOL elective  Plan:   1) Labor - foley bulb placed, pitocin should contractions space out  2) Fetus - category I tracing  3) One mild range BP - otherwise normotensive with no elevated BP this pregnancy.  If additional elevated will obtain [facility] labs  [facility], MD, [facility], [facility] 02/06/2020, 5:33 PM

## 2020-02-06 NOTE — Anesthesia Preprocedure Evaluation (Signed)
Anesthesia Evaluation  Patient identified by MRN, date of birth, ID band Patient awake    Reviewed: Allergy & Precautions, NPO status , Patient's Chart, lab work & pertinent test results  History of Anesthesia Complications (+) history of anesthetic complications  Airway Mallampati: II  TM Distance: >3 FB Neck ROM: Full    Dental no notable dental hx.    Pulmonary neg pulmonary ROS, neg sleep apnea, neg COPD,    breath sounds clear to auscultation- rhonchi (-) wheezing      Cardiovascular Exercise Tolerance: Good (-) hypertension(-) CAD, (-) Past MI, (-) Cardiac Stents and (-) CABG  Rhythm:Regular Rate:Normal - Systolic murmurs and - Diastolic murmurs    Neuro/Psych neg Seizures PSYCHIATRIC DISORDERS Anxiety negative neurological ROS     GI/Hepatic Neg liver ROS, GERD  ,  Endo/Other  negative endocrine ROSneg diabetes  Renal/GU negative Renal ROS     Musculoskeletal negative musculoskeletal ROS (+)   Abdominal (+) - obese,   Peds  Hematology negative hematology ROS (+) anemia ,   Anesthesia Other Findings Past Medical History: No date: Anxiety     Comment:  no meds No date: Complication of anesthesia No date: GERD (gastroesophageal reflux disease)     Comment:  no meds No date: No known health problems No date: PONV (postoperative nausea and vomiting)   Reproductive/Obstetrics (+) Pregnancy (missed abortion)                             Anesthesia Physical  Anesthesia Plan  ASA: II  Anesthesia Plan: Epidural   Post-op Pain Management:    Induction:   PONV Risk Score and Plan:   Airway Management Planned: Natural Airway  Additional Equipment:   Intra-op Plan:   Post-operative Plan:   Informed Consent: I have reviewed the patients History and Physical, chart, labs and discussed the procedure including the risks, benefits and alternatives for the proposed anesthesia with  the patient or authorized representative who has indicated his/her understanding and acceptance.     Dental advisory given  Plan Discussed with: CRNA and Anesthesiologist  Anesthesia Plan Comments:         Anesthesia Quick Evaluation

## 2020-02-06 NOTE — Progress Notes (Signed)
L&D progress Note   S: Foley bulb fell out at 1745 when up to the BR. Rating pain 7/10 with contractions. Pain in back worse then in lower abdomen. Desires something for pain.  O: BP (!) 141/80 (BP Location: Right Arm)   Pulse 63   Temp 98.8 F (37.1 C) (Oral)   Resp 16   Ht 5\' 1"  (1.549 m)   Wt 76.7 kg   LMP 05/04/2019   BMI 31.95 kg/m    FHR: baseline 125 with accelerations to 160s to 170s with moderate variability Toco: Contractions every 1-2 min apart  Last Cytotec about 6 hours ago Cervix: 4/60-70%/-1 to -2/ mid position   A: Progressing Last two blood pressures in the mild range, may be due to anxiety/pain-R/O preeclampsia  P: CMP, PC ratio Blood pressures every hour Stadol for pain at this time. Epidural when patient desires Will recheck in 2 hours and probably AROM at that time.  07/05/2019, CNM

## 2020-02-06 NOTE — Progress Notes (Signed)
Pt presents to L&D for scheduled IOL. Pt denies LOF, VB and states +FM.

## 2020-02-06 NOTE — Progress Notes (Signed)
Report given to Kern Alberta, RN.

## 2020-02-06 NOTE — H&P (Addendum)
OB History & Physical   History of Present Illness:  Chief Complaint:  Presents for elective IOL HPI:  Natalie Welch is a 29 y.o. G67P0010 female with EDC=02/08/2020 at [redacted]w[redacted]d dated by LMP=7week [place].  Her pregnancy has been complicated by anxiety, anemia, LGA fetus and polyhydramnios. . She had an elevated 1 hour GTT of 142 and 1 of 4 abnormal values in her 3 hour GTT. An ultrasound at 36wk6d gave EFW of 8#3oz and AC >97.7th%. AFI=24.3cm.  She has gained 34# with her pregnancy. She presents to L&D for an elective IOL. Has had some mild BH contractions. No bleeding or leakage of water from the vagina.  Prenatal care site: Prenatal care at [facility] has also been remarkable for   Clinic [facility] Prenatal Labs  Dating LMP = 7 week Blood type: O/Positive/-- (08/21 1133)   Genetic Screen NIPS:Normal XX, Inheritest normal Antibody:Negative (08/21 1133)  Anatomic US Anatomy scan normal Rubella: 3.27 (08/21 1133) Varicella:  Immune  GTT 142 3-hr 80 / 169 / 167 / 110 RPR: Non Reactive (08/21 1133)   Rhogam N/A HBsAg: Negative (08/21 1133)   TDaP vaccine  12/20/19 Flu Shot: declined HIV: Non Reactive (08/21 1133)   Baby Food   Breast                             GBS: negative  Contraception  Pap: 11/23/2018  CBB     CS/VBAC NA   Support Person Natalie Welch        Maternal Medical History:   Past Medical History:  Diagnosis Date  . Anemia   . Anxiety    no meds  . Complication of anesthesia   . PONV (postoperative nausea and vomiting)     Past Surgical History:  Procedure Laterality Date  . DILATION AND EVACUATION N/A 12/30/2018   Procedure: DILATATION AND EVACUATION;  Surgeon: Malachy Mood, MD;  Location: [facility] ORS;  Service: Gynecology;  Laterality: N/A;  . WISDOM TOOTH EXTRACTION      Allergies  Allergen Reactions  . Abilify [Aripiprazole] Other (See Comments)    Oculogyric crisis --eyes rolled in back of head  . Nuvigil [Armodafinil] Swelling    Tongue and throat  swelling    Prior to Admission medications   Medication Sig Start Date End Date Taking? Authorizing Provider  BONJESTA 20-20 MG TBCR TAKE 1 TABLET BY MOUTH TWICE A DAY 10/26/19  Yes Malachy Mood, MD  Ferrous Sulfate Dried ([facility] SLOW RELEASE IRON) 45 MG TBCR Take 1 tablet by mouth daily with breakfast. 11/17/19  Yes Rod Can, CNM  hydrOXYzine (ATARAX/VISTARIL) 25 MG tablet Take 1 tablet (25 mg total) by mouth every 6 (six) hours as needed for anxiety. 12/06/19   Dalia Heading, CNM  omeprazole (PRILOSEC) 20 MG capsule Take 1 capsule (20 mg total) by mouth daily. 11/14/19   Malachy Mood, MD  ondansetron (ZOFRAN-ODT) 4 MG disintegrating tablet TAKE 1 TABLET BY MOUTH EVERY 6 HOURS AS NEEDED FOR NAUSEA 12/19/19   Malachy Mood, MD          Social History: She  reports that she has never smoked. She has never used smokeless tobacco. She reports current alcohol use. She reports that she does not use drugs.  Family History: family history includes Healthy in her father; Hypertension in her mother.   Review of Systems: Negative x 10 systems reviewed except as noted in the HPI.      Physical  Exam:  Vital Signs: BP 137/82 (BP Location: Left Arm)   Pulse 70   Temp 98.4 F (36.9 C) (Oral)   Resp 16   Ht 5\' 1"  (1.549 m)   Wt 76.7 kg   LMP 05/04/2019   BMI 31.95 kg/m  General: no acute distress.  HEENT: normocephalic, atraumatic Heart: regular rate & rhythm.  No murmurs/rubs/gallops Lungs: clear to auscultation bilaterally, normal respiratory effort Abdomen: soft, gravid, non-tender;  EFW: 8#8oz cephalic on Leopold's Pelvic:   External: Normal external female genitalia  Cervix: FT/30-40%/-2  Extremities: non-tender, symmetric, no edema bilaterally.  Neurologic: Alert & oriented x 3.   Baseline FHR: 130 baseline with accelerations to 160, moderate variability Toco: occasional mild contractions Assessment:  Natalie Welch is a 29 y.o. G2P0010 female at [redacted]w[redacted]d  presenting for an elective IOL Bishop score: 3 Plan:  1. Admit to Labor & Delivery for IOL. Discussed methods of induction including Cytotec, foley bulb and Pitocin. Explained risks of hyperstimulation, fetal intolerance, FTP, and Cesarean section. Patient verbalizes understanding and wishes to proceed. Given Bishop score-will start with Cytotec induction.  2. Continuous fetal monitoring 3. CBC, T&S, Clrs, IVF 4. GBS negative.   5. Consents obtained. 6. O POS/ RI/ VI 7. TDAP given 2/23 8. Breast 9. Contraception: undecided.   3/23  02/06/2020 0900

## 2020-02-07 ENCOUNTER — Encounter
Admission: EM | Disposition: A | Discharge status: Home / Self Care | Payer: Self-pay | Source: Home / Self Care | Attending: Certified Nurse Midwife

## 2020-02-07 ENCOUNTER — Encounter: Payer: Self-pay | Admitting: Obstetrics and Gynecology

## 2020-02-07 DIAGNOSIS — O3663X0 Maternal care for excessive fetal growth, third trimester, not applicable or unspecified: Principal | ICD-10-CM

## 2020-02-07 DIAGNOSIS — Z98891 History of uterine scar from previous surgery: Secondary | ICD-10-CM

## 2020-02-07 DIAGNOSIS — Z23 Encounter for immunization: Secondary | ICD-10-CM | POA: Diagnosis not present

## 2020-02-07 DIAGNOSIS — O139 Gestational [pregnancy-induced] hypertension without significant proteinuria, unspecified trimester: Secondary | ICD-10-CM | POA: Diagnosis present

## 2020-02-07 DIAGNOSIS — O9902 Anemia complicating childbirth: Secondary | ICD-10-CM

## 2020-02-07 DIAGNOSIS — Z3A39 39 weeks gestation of pregnancy: Secondary | ICD-10-CM | POA: Diagnosis not present

## 2020-02-07 DIAGNOSIS — O26893 Other specified pregnancy related conditions, third trimester: Secondary | ICD-10-CM | POA: Diagnosis not present

## 2020-02-07 LAB — RPR: RPR Ser Ql: NONREACTIVE

## 2020-02-07 SURGERY — Surgical Case
Anesthesia: Spinal

## 2020-02-07 MED ORDER — EPHEDRINE SULFATE 50 MG/ML IJ SOLN
INTRAMUSCULAR | Status: DC | PRN
  Administered 2020-02-07: 5 mg via INTRAVENOUS

## 2020-02-07 MED ORDER — PHENYLEPHRINE HCL (PRESSORS) 10 MG/ML IV SOLN
INTRAVENOUS | Status: DC | PRN
  Administered 2020-02-07 (×5): 100 ug via INTRAVENOUS

## 2020-02-07 MED ORDER — KETOROLAC TROMETHAMINE 30 MG/ML IJ SOLN
30.0000 mg | Freq: Four times a day (QID) | INTRAMUSCULAR | Status: AC | PRN
  Administered 2020-02-07: 23:00:00 30 mg via INTRAMUSCULAR

## 2020-02-07 MED ORDER — FERROUS SULFATE 325 (65 FE) MG PO TABS
325.0000 mg | ORAL_TABLET | Freq: Two times a day (BID) | ORAL | Status: DC
  Administered 2020-02-08 – 2020-02-09 (×3): 325 mg via ORAL
  Filled 2020-02-07 (×3): qty 1

## 2020-02-07 MED ORDER — DIBUCAINE (PERIANAL) 1 % EX OINT: 1.0000 "application " | TOPICAL_OINTMENT | CUTANEOUS | Status: DC | PRN

## 2020-02-07 MED ORDER — SIMETHICONE 80 MG PO CHEW
80.0000 mg | CHEWABLE_TABLET | Freq: Three times a day (TID) | ORAL | Status: DC
  Administered 2020-02-07 – 2020-02-09 (×6): 80 mg via ORAL
  Filled 2020-02-07 (×6): qty 1

## 2020-02-07 MED ORDER — MORPHINE SULFATE (PF) 0.5 MG/ML IJ SOLN
INTRAMUSCULAR | Status: AC
  Filled 2020-02-07: qty 10

## 2020-02-07 MED ORDER — FENTANYL CITRATE (PF) 100 MCG/2ML IJ SOLN
50.0000 ug | Freq: Once | INTRAMUSCULAR | Status: AC
  Administered 2020-02-07: 06:00:00 50 ug via INTRAVENOUS

## 2020-02-07 MED ORDER — FENTANYL CITRATE (PF) 100 MCG/2ML IJ SOLN
INTRAMUSCULAR | Status: AC
  Filled 2020-02-07: qty 2

## 2020-02-07 MED ORDER — SODIUM CHLORIDE 0.9% FLUSH: 3.0000 mL | INTRAVENOUS | Status: DC | PRN

## 2020-02-07 MED ORDER — DEXAMETHASONE SODIUM PHOSPHATE 10 MG/ML IJ SOLN
INTRAMUSCULAR | Status: DC | PRN
  Administered 2020-02-07: 5 mg via INTRAVENOUS

## 2020-02-07 MED ORDER — PRENATAL MULTIVITAMIN CH
1.0000 | ORAL_TABLET | Freq: Every day | ORAL | Status: DC
  Administered 2020-02-08 – 2020-02-09 (×2): 1 via ORAL
  Filled 2020-02-07 (×2): qty 1

## 2020-02-07 MED ORDER — IBUPROFEN 600 MG PO TABS
600.0000 mg | ORAL_TABLET | Freq: Four times a day (QID) | ORAL | Status: DC
  Administered 2020-02-08 – 2020-02-09 (×5): 600 mg via ORAL
  Filled 2020-02-07 (×5): qty 1

## 2020-02-07 MED ORDER — ONDANSETRON HCL 4 MG/2ML IJ SOLN: 4.0000 mg | Freq: Three times a day (TID) | INTRAMUSCULAR | Status: DC | PRN

## 2020-02-07 MED ORDER — NALBUPHINE HCL 10 MG/ML IJ SOLN: 5.0000 mg | Freq: Once | INTRAMUSCULAR | Status: DC | PRN

## 2020-02-07 MED ORDER — SODIUM CHLORIDE 0.9 % IV SOLN
500.0000 mg | Freq: Once | INTRAVENOUS | Status: AC
  Administered 2020-02-07: 500 mg via INTRAVENOUS
  Filled 2020-02-07: qty 500

## 2020-02-07 MED ORDER — DEXAMETHASONE SODIUM PHOSPHATE 10 MG/ML IJ SOLN
INTRAMUSCULAR | Status: AC
  Filled 2020-02-07: qty 1

## 2020-02-07 MED ORDER — BUPIVACAINE HCL (PF) 0.5 % IJ SOLN
INTRAMUSCULAR | Status: AC
  Filled 2020-02-07: qty 30

## 2020-02-07 MED ORDER — NALBUPHINE HCL 10 MG/ML IJ SOLN: 5.0000 mg | INTRAMUSCULAR | Status: DC | PRN

## 2020-02-07 MED ORDER — FENTANYL CITRATE (PF) 100 MCG/2ML IJ SOLN
50.0000 ug | INTRAMUSCULAR | Status: DC | PRN
  Administered 2020-02-07: 100 ug via INTRAVENOUS
  Filled 2020-02-07: qty 2

## 2020-02-07 MED ORDER — KETOROLAC TROMETHAMINE 30 MG/ML IJ SOLN
INTRAMUSCULAR | Status: AC
  Administered 2020-02-07: 12:00:00 30 mg via INTRAVENOUS
  Filled 2020-02-07: qty 1

## 2020-02-07 MED ORDER — DIPHENHYDRAMINE HCL 25 MG PO CAPS
25.0000 mg | ORAL_CAPSULE | Freq: Four times a day (QID) | ORAL | Status: DC | PRN
  Administered 2020-02-07: 23:00:00 25 mg via ORAL
  Filled 2020-02-07: qty 1

## 2020-02-07 MED ORDER — OXYCODONE-ACETAMINOPHEN 5-325 MG PO TABS
1.0000 | ORAL_TABLET | ORAL | Status: DC | PRN
  Administered 2020-02-08 – 2020-02-09 (×4): 1 via ORAL
  Filled 2020-02-07 (×3): qty 1

## 2020-02-07 MED ORDER — PANTOPRAZOLE SODIUM 40 MG PO TBEC
40.0000 mg | DELAYED_RELEASE_TABLET | Freq: Every day | ORAL | Status: DC
  Administered 2020-02-08 – 2020-02-09 (×2): 40 mg via ORAL
  Filled 2020-02-07 (×3): qty 1

## 2020-02-07 MED ORDER — DIPHENHYDRAMINE HCL 50 MG/ML IJ SOLN: 12.5000 mg | INTRAMUSCULAR | Status: DC | PRN

## 2020-02-07 MED ORDER — SOD CITRATE-CITRIC ACID 500-334 MG/5ML PO SOLN
30.0000 mL | ORAL | Status: AC
  Administered 2020-02-07: 09:00:00 30 mL via ORAL

## 2020-02-07 MED ORDER — COCONUT OIL OIL
1.0000 "application " | TOPICAL_OIL | Status: DC | PRN
  Administered 2020-02-07: 1 via TOPICAL
  Filled 2020-02-07: qty 120

## 2020-02-07 MED ORDER — FENTANYL CITRATE (PF) 100 MCG/2ML IJ SOLN
INTRAMUSCULAR | Status: DC | PRN
  Administered 2020-02-07: 15 ug via INTRATHECAL
  Administered 2020-02-07: 35 ug via INTRATHECAL

## 2020-02-07 MED ORDER — FENTANYL CITRATE (PF) 100 MCG/2ML IJ SOLN: 25.0000 ug | INTRAMUSCULAR | Status: DC | PRN

## 2020-02-07 MED ORDER — MENTHOL 3 MG MT LOZG
1.0000 | LOZENGE | OROMUCOSAL | Status: DC | PRN
  Filled 2020-02-07: qty 9

## 2020-02-07 MED ORDER — NALOXONE HCL 4 MG/10ML IJ SOLN
1.0000 ug/kg/h | INTRAVENOUS | Status: DC | PRN
  Filled 2020-02-07: qty 5

## 2020-02-07 MED ORDER — SODIUM CHLORIDE 0.9 % IV SOLN
INTRAVENOUS | Status: DC | PRN
  Administered 2020-02-07: 50 ug/min via INTRAVENOUS

## 2020-02-07 MED ORDER — ONDANSETRON HCL 4 MG/2ML IJ SOLN
INTRAMUSCULAR | Status: DC | PRN
  Administered 2020-02-07: 4 mg via INTRAVENOUS

## 2020-02-07 MED ORDER — BUPIVACAINE HCL (PF) 0.5 % IJ SOLN: 5.0000 mL | Freq: Once | INTRAMUSCULAR | Status: DC

## 2020-02-07 MED ORDER — BUPIVACAINE 0.25 % ON-Q PUMP DUAL CATH 400 ML
400.0000 mL | INJECTION | Status: DC
  Filled 2020-02-07: qty 400

## 2020-02-07 MED ORDER — ONDANSETRON HCL 4 MG/2ML IJ SOLN: 4.0000 mg | Freq: Once | INTRAMUSCULAR | Status: DC | PRN

## 2020-02-07 MED ORDER — OXYCODONE-ACETAMINOPHEN 5-325 MG PO TABS
2.0000 | ORAL_TABLET | ORAL | Status: DC | PRN
  Filled 2020-02-07: qty 2

## 2020-02-07 MED ORDER — NALOXONE HCL 0.4 MG/ML IJ SOLN: 0.4000 mg | INTRAMUSCULAR | Status: DC | PRN

## 2020-02-07 MED ORDER — MORPHINE SULFATE (PF) 0.5 MG/ML IJ SOLN
INTRAMUSCULAR | Status: DC | PRN
  Administered 2020-02-07: .1 mg via INTRATHECAL

## 2020-02-07 MED ORDER — BUPIVACAINE HCL (PF) 0.5 % IJ SOLN
INTRAMUSCULAR | Status: DC | PRN
  Administered 2020-02-07: 30 mL

## 2020-02-07 MED ORDER — LACTATED RINGERS IV SOLN: INTRAVENOUS | Status: DC

## 2020-02-07 MED ORDER — WITCH HAZEL-GLYCERIN EX PADS: 1.0000 "application " | MEDICATED_PAD | CUTANEOUS | Status: DC | PRN

## 2020-02-07 MED ORDER — DIPHENHYDRAMINE HCL 25 MG PO CAPS: 25.0000 mg | ORAL_CAPSULE | ORAL | Status: DC | PRN

## 2020-02-07 MED ORDER — OXYTOCIN 40 UNITS IN NORMAL SALINE INFUSION - SIMPLE MED
INTRAVENOUS | Status: DC | PRN
  Administered 2020-02-07: 500 mL via INTRAVENOUS

## 2020-02-07 MED ORDER — OXYTOCIN 40 UNITS IN NORMAL SALINE INFUSION - SIMPLE MED: 2.5000 [IU]/h | INTRAVENOUS | Status: AC

## 2020-02-07 MED ORDER — MEPERIDINE HCL 25 MG/ML IJ SOLN: 6.2500 mg | INTRAMUSCULAR | Status: DC | PRN

## 2020-02-07 MED ORDER — ONDANSETRON HCL 4 MG/2ML IJ SOLN
INTRAMUSCULAR | Status: AC
  Filled 2020-02-07: qty 2

## 2020-02-07 MED ORDER — DIPHENHYDRAMINE HCL 50 MG/ML IJ SOLN: 50.0000 mg | Freq: Once | INTRAMUSCULAR | Status: DC

## 2020-02-07 MED ORDER — CEFAZOLIN SODIUM-DEXTROSE 2-4 GM/100ML-% IV SOLN
2.0000 g | INTRAVENOUS | Status: AC
  Administered 2020-02-07: 10:00:00 2 g via INTRAVENOUS
  Filled 2020-02-07: qty 100

## 2020-02-07 MED ORDER — BUPIVACAINE IN DEXTROSE 0.75-8.25 % IT SOLN
INTRATHECAL | Status: DC | PRN
  Administered 2020-02-07: 1.5 mL via INTRATHECAL

## 2020-02-07 MED ORDER — KETOROLAC TROMETHAMINE 30 MG/ML IJ SOLN
30.0000 mg | Freq: Four times a day (QID) | INTRAMUSCULAR | Status: AC | PRN
  Administered 2020-02-07 – 2020-02-08 (×2): 30 mg via INTRAVENOUS
  Filled 2020-02-07 (×3): qty 1

## 2020-02-07 MED ORDER — PHENYLEPHRINE HCL (PRESSORS) 10 MG/ML IV SOLN
INTRAVENOUS | Status: AC
  Filled 2020-02-07: qty 1

## 2020-02-07 MED ORDER — SENNOSIDES-DOCUSATE SODIUM 8.6-50 MG PO TABS
2.0000 | ORAL_TABLET | ORAL | Status: DC
  Administered 2020-02-07 – 2020-02-08 (×2): 2 via ORAL
  Filled 2020-02-07 (×2): qty 2

## 2020-02-07 SURGICAL SUPPLY — 36 items
CANISTER SUCT 3000ML PPV (MISCELLANEOUS) ×3 IMPLANT
CATH KIT ON-Q SILVERSOAK 5 (CATHETERS) ×2 IMPLANT
CATH KIT ON-Q SILVERSOAK 5IN (CATHETERS) ×6 IMPLANT
CLOSURE WOUND 1/2 X4 (GAUZE/BANDAGES/DRESSINGS) ×1
COVER WAND RF STERILE (DRAPES) ×3 IMPLANT
DERMABOND ADVANCED (GAUZE/BANDAGES/DRESSINGS) ×2
DERMABOND ADVANCED .7 DNX12 (GAUZE/BANDAGES/DRESSINGS) ×1 IMPLANT
DRSG OPSITE POSTOP 4X10 (GAUZE/BANDAGES/DRESSINGS) ×3 IMPLANT
DRSG TELFA 3X8 NADH (GAUZE/BANDAGES/DRESSINGS) ×3 IMPLANT
ELECT CAUTERY BLADE 6.4 (BLADE) ×3 IMPLANT
ELECT REM PT RETURN 9FT ADLT (ELECTROSURGICAL) ×3
ELECTRODE REM PT RTRN 9FT ADLT (ELECTROSURGICAL) ×1 IMPLANT
GAUZE SPONGE 4X4 12PLY STRL (GAUZE/BANDAGES/DRESSINGS) ×3 IMPLANT
GLOVE BIO SURGEON STRL SZ7 (GLOVE) ×3 IMPLANT
GLOVE INDICATOR 7.5 STRL GRN (GLOVE) ×3 IMPLANT
GOWN STRL REUS W/ TWL LRG LVL3 (GOWN DISPOSABLE) ×3 IMPLANT
GOWN STRL REUS W/TWL LRG LVL3 (GOWN DISPOSABLE) ×6
NS IRRIG 1000ML POUR BTL (IV SOLUTION) ×3 IMPLANT
PACK C SECTION AR (MISCELLANEOUS) ×3 IMPLANT
PAD DRESSING TELFA 3X8 NADH (GAUZE/BANDAGES/DRESSINGS) ×1 IMPLANT
PAD OB MATERNITY 4.3X12.25 (PERSONAL CARE ITEMS) ×6 IMPLANT
PAD PREP 24X41 OB/GYN DISP (PERSONAL CARE ITEMS) ×3 IMPLANT
PENCIL SMOKE ULTRAEVAC 22 CON (MISCELLANEOUS) ×3 IMPLANT
STRIP CLOSURE SKIN 1/2X4 (GAUZE/BANDAGES/DRESSINGS) ×2 IMPLANT
SUCT VACUUM KIWI BELL (SUCTIONS) ×2 IMPLANT
SUT MNCRL 4-0 (SUTURE) ×4
SUT MNCRL 4-0 27XMFL (SUTURE) ×2
SUT PDS AB 1 TP1 96 (SUTURE) ×3 IMPLANT
SUT PLAIN GUT 0 (SUTURE) IMPLANT
SUT VIC AB 0 CTX 36 (SUTURE) ×4
SUT VIC AB 0 CTX36XBRD ANBCTRL (SUTURE) ×2 IMPLANT
SUT VIC AB 1 CT1 36 (SUTURE) ×2 IMPLANT
SUT VICRYL 3-0 27IN SH (SUTURE) ×4 IMPLANT
SUTURE MNCRL 4-0 27XMF (SUTURE) ×1 IMPLANT
SWABSTK COMLB BENZOIN TINCTURE (MISCELLANEOUS) ×3 IMPLANT
TAPE STRIPS DRAPE STRL (GAUZE/BANDAGES/DRESSINGS) ×2 IMPLANT

## 2020-02-07 NOTE — Transfer of Care (Signed)
Immediate Anesthesia Transfer of Care Note  Patient: Natalie Welch  Procedure(s) Performed: CESAREAN SECTION (N/A )  Patient Location: Mother/Baby  Anesthesia Type:Spinal  Level of Consciousness: awake, alert  and oriented  Airway & Oxygen Therapy: Patient Spontanous Breathing  Post-op Assessment: Report given to RN and Post -op Vital signs reviewed and stable  Post vital signs: Reviewed  Last Vitals:  Vitals Value Taken Time  BP 125/87 02/07/20 1128  Temp 36.5 C 02/07/20 1128  Pulse 93 02/07/20 1128  Resp 16 02/07/20 1128  SpO2 98 % 02/07/20 1128    Last Pain:  Vitals:   02/07/20 5573  TempSrc: Oral  PainSc: 10-Worst pain ever         Complications: No apparent anesthesia complications

## 2020-02-07 NOTE — Progress Notes (Signed)
L&D Progress Note   S: Having extreme pain and pressure in perineal/urethral/rectal area with contractions  O: 144/72, Pulse 112  General: moaning with pain with contractions. Started about 0445. No discomfort in back and lower abdomen with contractions  FHR: 130 baseline with accelerations to 150, moderate variability  Toco: contractions every 2-3 minutes on 10 miu of Pitocin. Contractions strong to palpation.   Cervix: RN reports steady progress during the night and at 0430 was 6cm/80%/0station Cervix by my exam now 5/75%/-1  A: Swelling of cervix Pelvic pressure and pain with contractions not relieved with epidural  P: Consulted Dr Noralyn Pick, anesthesia, regarding pelvic pain. He will bolus her epidural. Fentanyl for pain if needed Called Dr Bonney Aid to report progress and patient's pain. Will insert IUPC per his recommendations and titrate Pitocin to get 200-250 mvus   Natalie Welch, CNM

## 2020-02-07 NOTE — Discharge Summary (Signed)
Postpartum Discharge Summary  Patient Name: Natalie Welch DOB: December 31, 1990 MRN: [id number]  Date of admission: 02/06/2020 Delivering Provider: Prentice Docker D  Date of Delivery: 02/07/2020  Date of discharge: 02/09/2020  Admitting diagnosis: Macrosomia affecting management of mother in third trimester, fetus 38 [O36.63X1] Intrauterine pregnancy: [redacted]w[redacted]d    Secondary diagnosis:  Active Problems:   Supervision of normal first pregnancy, antepartum   Anemia during pregnancy   Polyhydramnios affecting pregnancy in third trimester   Macrosomia affecting management of mother in third trimester   Status post cesarean delivery   Arrest of dilation, delivered, current hospitalization   Gestational hypertension  Additional problems: none     Discharge diagnosis: Term Pregnancy Delivered and Gestational Hypertension                                                                                                Post partum procedures:blood transfusion  Augmentation: AROM, Pitocin, Cytotec and Foley Balloon  Complications: None  Hospital course:  Induction of Labor With Cesarean Section  29y.o. yo G2P1011 at 37w6das admitted to the hospital 02/06/2020 for induction of labor. Patient had a labor course significant for elective induction for suspected fetal macrosomia. The patient went for cesarean section due to Arrest of Dilation, and delivered a Viable infant,02/07/2020  Membrane Rupture Time/Date: 9:12 PM ,02/06/2020   Details of operation can be found in separate operative Note.  Patient had an uncomplicated postpartum course. She is ambulating, tolerating a regular diet, passing flatus, and urinating well.  Patient is discharged home in stable condition on 02/09/20.                                   Delivery time: 10:20 AM    Magnesium Sulfate received: No BMZ received: No Rhophylac:No MMR:No Transfusion:No  Physical exam  Vitals:   02/08/20 2323 02/09/20 0818 02/09/20 1150  02/09/20 1227  BP: 130/72 140/73 123/88 138/80  Pulse: 90 93 88 79  Resp: 18 20 20 18   Temp: 98 F (36.7 C) 98.7 F (37.1 C) 98.9 F (37.2 C) 98.4 F (36.9 C)  TempSrc: Oral Oral Oral Tympanic  SpO2: 96% 99% 98% 96%  Weight:      Height:       General: alert, cooperative and no distress Lochia: appropriate Uterine Fundus: firm Incision: Healing well with no significant drainage DVT Evaluation: No evidence of DVT seen on physical exam. Labs: Lab Results  Component Value Date   WBC 12.7 (H) 02/09/2020   HGB 6.7 (L) 02/09/2020   HCT 19.7 (L) 02/09/2020   MCV 86.4 02/09/2020   PLT 173 02/09/2020   CMP Latest Ref Rng & Units 02/06/2020  Glucose 70 - 99 mg/dL 82  BUN 6 - 20 mg/dL 7  Creatinine 0.44 - 1.00 mg/dL 0.55  Sodium 135 - 145 mmol/L 136  Potassium 3.5 - 5.1 mmol/L 3.4(L)  Chloride 98 - 111 mmol/L 107  CO2 22 - 32 mmol/L 21(L)  Calcium 8.9 - 10.3 mg/dL 8.7(L)  Total Protein  6.5 - 8.1 g/dL 6.2(L)  Total Bilirubin 0.3 - 1.2 mg/dL 0.3  Alkaline Phos 38 - 126 U/L 120  AST 15 - 41 U/L 23  ALT 0 - 44 U/L 13   Edinburgh Score: Edinburgh Postnatal Depression Scale Screening Tool 02/07/2020  I have been able to laugh and see the funny side of things. 0  I have looked forward with enjoyment to things. 0  I have blamed myself unnecessarily when things went wrong. 2  I have been anxious or worried for no good reason. 2  I have felt scared or panicky for no good reason. 2  Things have been getting on top of me. 2  I have been so unhappy that I have had difficulty sleeping. 1  I have felt sad or miserable. 2  I have been so unhappy that I have been crying. 1  The thought of harming myself has occurred to me. 0  Edinburgh Postnatal Depression Scale Total 12    Discharge instruction: per After Visit Summary and "Baby and Me Booklet".  After visit meds:  Allergies as of 02/09/2020      Reactions   Abilify [aripiprazole] Other (See Comments)   Oculogyric crisis --eyes  rolled in back of head   Nuvigil [armodafinil] Swelling   Tongue and throat swelling      Medication List    STOP taking these medications   Bonjesta 20-20 MG Tbcr Generic drug: Doxylamine-Pyridoxine ER     TAKE these medications   [facility] Slow Release Iron 45 MG Tbcr Generic drug: Ferrous Sulfate Dried Take 1 tablet by mouth daily with breakfast.   ferrous sulfate 325 (65 FE) MG tablet Take 1 tablet (325 mg total) by mouth daily with breakfast.   hydrOXYzine 25 MG tablet Commonly known as: ATARAX/VISTARIL Take 1 tablet (25 mg total) by mouth every 6 (six) hours as needed for anxiety.   ibuprofen 600 MG tablet Commonly known as: ADVIL Take 1 tablet (600 mg total) by mouth every 6 (six) hours.   omeprazole 20 MG capsule Commonly known as: PRILOSEC Take 1 capsule (20 mg total) by mouth daily.   ondansetron 4 MG disintegrating tablet Commonly known as: ZOFRAN-ODT TAKE 1 TABLET BY MOUTH EVERY 6 HOURS AS NEEDED FOR NAUSEA   oxyCODONE-acetaminophen 5-325 MG tablet Commonly known as: PERCOCET/ROXICET Take 1 tablet by mouth every 4 (four) hours as needed for moderate pain (pain score 4-7/10).            Discharge Care Instructions  (From admission, onward)         Start     Ordered   02/09/20 0000  Discharge wound care:    Comments: You may apply a light dressing for minor discharge from the incision or to keep waistbands of clothing from rubbing.  You may also have been discharge with a clear dressing in which case this will be removed at your postoperative clinic visit.  You may shower, use soap on your incision.  Avoid baths or soaking the incision in the first 6 weeks following your surgery.Marland Kitchen   02/09/20 1425          Diet: routine diet  Activity: Advance as tolerated. Pelvic rest for 6 weeks.   Outpatient follow up: 1 week for incision check Follow up Appt:No future appointments. Follow up Visit: Follow-up Information    Will Bonnet, MD. Schedule an  appointment as soon as possible for a visit in 1 week(s).   Specialty: Obstetrics and Gynecology Why:  Post-op incision check Contact information: [address] [place] [address] [phone]          High risk pregnancy complicated by: gestational hypertension, fetal macrosomia, polyhydramnios Delivery mode:  cesarean delivery Anticipated Birth Control:  Condoms PP Procedures needed: Incision check    Newborn Data: Live born female "[name]" Birth Weight: 8 lb 8.9 oz (3,880 g) APGAR: 33, 9  Newborn Delivery   Birth date/time: 02/07/2020 10:20:00 Delivery type: C-Section, Vacuum Assisted C-section categorization: Primary     Baby Feeding: Breast Disposition:home with mother  SIGNATURE:  Malachy Mood, MD, [place], [facility] 02/09/2020, 2:26 PM

## 2020-02-07 NOTE — Progress Notes (Signed)
RN at bedside. Pt reports 10/10 pain in her bottom, perineum, stomach and back. of fentanyl given with no relief. Provider aware. Verbal order from provider to discontinue the pitocin. Pitocin discontinued at this time. Provider talked with patient about possibly doing a cesarean section. Pt was very accepting of this idea and is waiting for MD to come speak with her. Will continue to monitor.

## 2020-02-07 NOTE — Progress Notes (Signed)
RN to contact anesthesia for PACU orders.

## 2020-02-07 NOTE — Op Note (Signed)
Cesarean Section Operative Note    Patient's Name: Natalie Welch  MRN: [id number]  Date of Service: 02/07/2020   Pre-operative Diagnosis:  1) Arrest of dilation 2) intrauterine pregnancy at [redacted]w[redacted]d  3) suspected fetal macrosomia  Post-operative Diagnosis:  1) Arrest of dilation 2) intrauterine pregnancy at [redacted]w[redacted]d  3) suspected fetal macrosomia   Procedure: Primary Low transverse cesarean section via Pfannenstiel incision with double layer uterine closure  Surgeon: Surgeon(s) and Role:    Conard Novak, MD - Primary   Assistants: Mena Goes, RNFA  Anesthesia: spinal   Findings:  1) normal appearing gravid uterus, fallopian tubes, and ovaries 2) viable female infant with weight 3,880 grams, APGARs 8 and 9   Quantified Blood Loss: 694 mL  Total IV Fluids: 1,000 ml   Urine Output: 100 mL clear urine at end of case  Specimens: none  Complications: no complications  Disposition: PACU - hemodynamically stable.   Maternal Condition: stable   Baby condition / location:  Couplet care / Skin to Skin  Procedure Details:  The patient was seen in the Holding Room. The risks, benefits, complications, treatment options, and expected outcomes were discussed with the patient. The patient concurred with the proposed plan, giving informed consent. identified as Tana Conch and the procedure verified as C-Section Delivery. A Time Out was held and the above information confirmed.   After induction of anesthesia, the patient was prepped and draped  in the usual sterile manner. A Pfannenstiel incision was made and carried down through the subcutaneous tissue to the fascia. Fascial incision was made and extended transversely. The fascia was separated from the underlying rectus tissue superiorly and inferiorly. The peritoneum was identified and entered. Peritoneal incision was extended longitudinally. The bladder flap was not bluntly or sharply freed from the lower uterine segment.  A low transverse uterine incision was made and the hysterotomy was extended with cranial-caudal tension. Delivered from cephalic presentation was a 3,880 gram Living newborn infant(s) or Female with Apgar scores of 8 at one minute and 9 at five minutes. A vacuum was required to effect delivery of the fetal head.  The vacuum was applied over the posterior fontenelle and suction was obtained until the indicator was in the green zone.  Gentle traction was applied along with fundal pressure. The head was soon delivered and the suction was released and the vacuum removed.  The vacuum was in place for no more than 20-30 seconds.  Cord ph was not sent the umbilical cord was clamped and cut cord blood was obtained for evaluation. The placenta was removed Intact and appeared normal. The uterine outline, tubes and ovaries appeared normal. The uterine incision was closed with running locked sutures of 0 Vicryl.  A small left cervical extension was repaired with 0-Vicryl in a running, locked fashion.  A second layer of the same suture was thrown in an imbricating fashion.  Several interrupted 0-Vicryl and 3-0 Vicryl sutures were thrown to obtained hemostasis.  Hemostasis was assured.  The uterus was returned to the abdomen and the paracolic gutters were cleared of all clots and debris.  The rectus muscles were inspected and found to be hemostatic.  The On-Q catheter pumps were inserted in accordance with the manufacturer's recommendations.  The catheters were inserted approximately 4cm cephelad to the incision line, approximately 1cm apart, straddling the midline.  They were inserted to a depth of the 4th mark. They were positioned superficial to the rectus abdominus muscles and deep to the  rectus fascia.    The fascia was then reapproximated with running sutures of 1-0 PDS, looped. A running 3-0 vicryl suture was used to re-approximate the subcutaneous tissue and to reduce tension on the skin closure. The subcuticular  closure was performed using 4-0 monocryl. The skin closure was reinforced using surgical skin glue.  The On-Q catheters were bolused with 5 mL of 0.5% marcaine plain for a total of 10 mL.  The catheters were affixed to the skin with surgical skin glue, steri-strips, and tegaderm.    Instrument, sponge, and needle counts were correct prior the abdominal closure and were correct at the conclusion of the case.  The patient received Ancef 2 gram IV prior to skin incision (within 30 minutes) and Azithromycin 500 mg IV. For VTE prophylaxis she was wearing SCDs throughout the case.    Signed: Will Bonnet, MD 02/07/2020 11:16 AM

## 2020-02-07 NOTE — Lactation Note (Signed)
This note was copied from a baby's chart. Lactation Consultation Note  Patient Name: Natalie Welch [id number]'S Date: 02/07/2020 Reason for consult: Initial assessment;Primapara;Term;1st time breastfeeding;Other (Comment)(c/s delivery)  LC and LC Student in to assist mom in LDR1 with first breastfeeding attempt post c-section delivery. Mom is G2P1, and desires to breastfeed. Mom expresses she is anxious about breastfeeding, when asked why, she states she is anxious about everything. LC provided reassurance of support and efforts to help her breastfeeding journey be successful. Baby was skin to skin on mom's chest, alert, but non-cuing. [facility] student began by discussing early hunger cues and benefits of skin to skin. [facility] student discussed different position options, opting for football on right breast hold due to mom's incision; extra blanket and pillow used for support and positioning. LC taught hand expression, a few drops visible. LC student attempted placement in football hold and efforts to elicit and receive an achieved latch. LC did assist with repositioning of infant, and time at the breast encouraging latch. After a few minutes baby did show interest and began to lick around the nipple; LC held breast tissue in tea cup hold and placed baby onto breast, mom feeling tugging. Baby remained on/off of breast for 15 minutes.  In effort to try positions at both breasts baby was transitioned to football hold on left breast with use of support pillows and LC Student assistance with placement of baby and support of breast tissue. Mom was able to demonstrate hand expression; drops expressed, baby eagerly latched and was on/off breast for 5 minutes before falling asleep. [facility] student assisted with bringing baby skin to skin. Mom grateful for the support, and extremely tired- encouraged to rest. LC number given to parents and encouraged to call for support. LC and/or LC student will follow-up on MB unit.  Maternal  Data Formula Feeding for Exclusion: No Has patient been taught Hand Expression?: Yes Does the patient have breastfeeding experience prior to this delivery?: No  Feeding Feeding Type: Breast Fed  LATCH Score Latch: Repeated attempts needed to sustain latch, nipple held in mouth throughout feeding, stimulation needed to elicit sucking reflex.  Audible Swallowing: None  Type of Nipple: Everted at rest and after stimulation  Comfort (Breast/Nipple): Soft / non-tender  Hold (Positioning): Assistance needed to correctly position infant at breast and maintain latch.  LATCH Score: 6  Interventions Interventions: Breast feeding basics reviewed;Assisted with latch;Skin to skin;Breast massage;Hand express;Breast compression;Reverse pressure;Adjust position;Support pillows  Lactation Tools Discussed/Used     Consult Status Consult Status: Follow-up Date: 02/07/20 Follow-up type: In-patient    Danford Bad 02/07/2020, 12:16 PM

## 2020-02-07 NOTE — Lactation Note (Signed)
This note was copied from a baby's chart. Lactation Consultation Note  Patient Name: Natalie Welch ZLDJT'T Date: 02/07/2020 Reason for consult: Follow-up assessment;Mother's request;Primapara;1st time breastfeeding;Other (Comment)(mom c/s)   LC student entered room and Infant was asleep in blanket in [name]'s arms. [facility] student reviewed hunger cues and normal newborn behavior.  Parents affirmed all questions answered at this time and know to call for assistance, as needed.   Maternal Data Formula Feeding for Exclusion: No Has patient been taught Hand Expression?: Yes Does the patient have breastfeeding experience prior to this delivery?: No  Feeding Feeding Type: Breast Fed  LATCH Score Latch: Repeated attempts needed to sustain latch, nipple held in mouth throughout feeding, stimulation needed to elicit sucking reflex.  Audible Swallowing: None  Type of Nipple: Everted at rest and after stimulation  Comfort (Breast/Nipple): Soft / non-tender  Hold (Positioning): Assistance needed to correctly position infant at breast and maintain latch.  LATCH Score: 6  Interventions Interventions: Breast feeding basics reviewed;Assisted with latch;Support pillows;Position options  Lactation Tools Discussed/Used     Consult Status Consult Status: Follow-up Date: 02/07/20 Follow-up type: In-patient    Carlena Hurl 02/07/2020, 4:45 PM

## 2020-02-07 NOTE — Progress Notes (Signed)
Patient has had adequate contractions overnight.  She initially was dilated to 6 cm and now has swelling on her cervix such that the exam is more like a 5cm dilation. Based on her long-term adequate contractions, she has had a failure to progress (arrest of dilation). Discussed the clinical scenario with the patient. There is already concern for a large fetus based on a recent ultrasound. The patient readily agrees to cesarean delivery.  I personally consented the patient and she agrees.   Thomasene Mohair, MD, [facility], [facility] 02/07/2020 9:25 AM

## 2020-02-07 NOTE — Lactation Note (Signed)
This note was copied from a baby's chart. Lactation Consultation Note  Patient Name: Natalie Welch [id number]'N Date: 02/07/2020 Reason for consult: Follow-up assessment;Mother's request;Primapara;1st time breastfeeding;Other (Comment)(mom c/s)  LC in to check on mom/baby after moving to [facility] unit. Baby asleep on mom's chest skin to skin. LC reviewed breastfeeding basics: newborn stomach size, feeding patterns, early hunger cues, benefits of skin to skin, and wet/stool output expectations. Baby did begin to stir briefly, moved easily into cross cradle position, LC holding breast tissue in tea cup hold was able to achieve latch and a few sucks, baby did not maintain for extended period of time. Baby was moved back to mom's chest, skin to skin, where she was sleeping soundly. LC reviewed normal course of lactation, supply/demand, breastfeeding attempts 8-12x/day, and nipple care and comfort.  LC name/number available on whiteboard, parents encouraged to call out when needed.  Maternal Data Formula Feeding for Exclusion: No Has patient been taught Hand Expression?: Yes Does the patient have breastfeeding experience prior to this delivery?: No  Feeding Feeding Type: Breast Fed  LATCH Score Latch: Repeated attempts needed to sustain latch, nipple held in mouth throughout feeding, stimulation needed to elicit sucking reflex.  Audible Swallowing: None  Type of Nipple: Everted at rest and after stimulation  Comfort (Breast/Nipple): Soft / non-tender  Hold (Positioning): Assistance needed to correctly position infant at breast and maintain latch.  LATCH Score: 6  Interventions Interventions: Breast feeding basics reviewed;Assisted with latch;Support pillows;Position options  Lactation Tools Discussed/Used     Consult Status Consult Status: Follow-up Date: 02/07/20 Follow-up type: In-patient    Danford Bad 02/07/2020, 2:30 PM

## 2020-02-08 ENCOUNTER — Inpatient Hospital Stay: Admit: 2020-02-08 | Payer: Self-pay

## 2020-02-08 DIAGNOSIS — O3663X0 Maternal care for excessive fetal growth, third trimester, not applicable or unspecified: Secondary | ICD-10-CM | POA: Diagnosis not present

## 2020-02-08 DIAGNOSIS — O26893 Other specified pregnancy related conditions, third trimester: Secondary | ICD-10-CM | POA: Diagnosis not present

## 2020-02-08 LAB — CBC WITH DIFFERENTIAL/PLATELET
Abs Immature Granulocytes: 0.12 10*3/uL — ABNORMAL HIGH (ref 0.00–0.07)
Basophils Absolute: 0 10*3/uL (ref 0.0–0.1)
Basophils Relative: 0 %
Eosinophils Absolute: 0.1 10*3/uL (ref 0.0–0.5)
Eosinophils Relative: 1 %
HCT: 19.5 % — ABNORMAL LOW (ref 36.0–46.0)
Hemoglobin: 6.6 g/dL — ABNORMAL LOW (ref 12.0–15.0)
Immature Granulocytes: 1 %
Lymphocytes Relative: 11 %
Lymphs Abs: 1.8 10*3/uL (ref 0.7–4.0)
MCH: 29.3 pg (ref 26.0–34.0)
MCHC: 33.8 g/dL (ref 30.0–36.0)
MCV: 86.7 fL (ref 80.0–100.0)
Monocytes Absolute: 1.2 10*3/uL — ABNORMAL HIGH (ref 0.1–1.0)
Monocytes Relative: 8 %
Neutro Abs: 12.3 10*3/uL — ABNORMAL HIGH (ref 1.7–7.7)
Neutrophils Relative %: 79 %
Platelets: 148 10*3/uL — ABNORMAL LOW (ref 150–400)
RBC: 2.25 MIL/uL — ABNORMAL LOW (ref 3.87–5.11)
RDW: 14.6 % (ref 11.5–15.5)
WBC: 15.5 10*3/uL — ABNORMAL HIGH (ref 4.0–10.5)
nRBC: 0 % (ref 0.0–0.2)

## 2020-02-08 LAB — CBC
HCT: 17.5 % — ABNORMAL LOW (ref 36.0–46.0)
HCT: 18 % — ABNORMAL LOW (ref 36.0–46.0)
Hemoglobin: 5.8 g/dL — ABNORMAL LOW (ref 12.0–15.0)
Hemoglobin: 6.2 g/dL — ABNORMAL LOW (ref 12.0–15.0)
MCH: 29.1 pg (ref 26.0–34.0)
MCH: 33.2 pg (ref 26.0–34.0)
MCHC: 33.1 g/dL (ref 30.0–36.0)
MCHC: 34.4 g/dL (ref 30.0–36.0)
MCV: 87.9 fL (ref 80.0–100.0)
MCV: 96.3 fL (ref 80.0–100.0)
Platelets: 159 10*3/uL (ref 150–400)
Platelets: 170 10*3/uL (ref 150–400)
RBC: 1.99 MIL/uL — ABNORMAL LOW (ref 3.87–5.11)
RBC: 1.87 MIL/uL — ABNORMAL LOW (ref 3.87–5.11)
RDW: 15.1 % (ref 11.5–15.5)
RDW: 18.1 % — ABNORMAL HIGH (ref 11.5–15.5)
WBC: 15.1 10*3/uL — ABNORMAL HIGH (ref 4.0–10.5)
WBC: 15.4 10*3/uL — ABNORMAL HIGH (ref 4.0–10.5)
nRBC: 0 % (ref 0.0–0.2)
nRBC: 0 % (ref 0.0–0.2)

## 2020-02-08 LAB — PREPARE RBC (CROSSMATCH)

## 2020-02-08 MED ORDER — SODIUM CHLORIDE 0.9% IV SOLUTION: Freq: Once | INTRAVENOUS | Status: AC

## 2020-02-08 MED ORDER — DIPHENHYDRAMINE HCL 25 MG PO CAPS
50.0000 mg | ORAL_CAPSULE | Freq: Once | ORAL | Status: AC
  Administered 2020-02-08: 12:00:00 50 mg via ORAL
  Filled 2020-02-08: qty 2

## 2020-02-08 NOTE — Lactation Note (Signed)
This note was copied from a baby's chart. Lactation Consultation Note  Patient Name: Natalie Welch [id number] Date: 02/08/2020 Reason for consult: Follow-up assessment  LC to follow-up. Mom reports deciding to offer formula. She attempted a feed after LC student and LC assisted and felt herself becoming frustrated. She feels that with her hx of anxiety that formula feeding and the ability for dad to help with feedings would be better for her and her family. LC acknowledges and commends mom for making a decision that she feels would work better for them as a family. LC provided guidance for the onset of milk production due to previous breastfeeding efforts, decrease of discomfort, expressing to comfort, cold compresses, use of cabbage leaves to help dry up her milk. Discussed differences between breast fullness, engorgement, and mastitis, and when to seek help from her provider. Discussed with dad paced-bottle feeding and formula preparation once home from the hospital. Parents had no questions.  Maternal Data Formula Feeding for Exclusion: No Has patient been taught Hand Expression?: Yes Does the patient have breastfeeding experience prior to this delivery?: No  Feeding Feeding Type: Bottle Fed - Formula  LATCH Score                   Interventions Interventions: Breast feeding basics reviewed  Lactation Tools Discussed/Used     Consult Status Consult Status: Complete Date: 02/09/20 Follow-up type: Call as needed    Danford Bad 02/08/2020, 5:27 PM

## 2020-02-08 NOTE — Lactation Note (Signed)
This note was copied from a baby's chart. Lactation Consultation Note  Patient Name: Natalie Welch'Q Date: 02/08/2020 Reason for consult: Follow-up assessment  Upon entering LC went over the reason for changing the NS to a 20 and how LC student would see which was a better fit so baby Natalie Welch could feed. LC explained that since NS is a barrier pumping can be started to ensure milk is being transferred adequately. Empty breasts make more milk was explained as well. Comfort gels were given and places on left breast since [name] mentioned discomfort during last feeding attempt.  While [facility] student completed breast massages and then hand expressed some colostrum to entice baby without the NS. After a few attempts without NS, LC student tried to latch with NS 20. LC student explained that having colostrum in the NS like at the last feeding was a sign that baby is able to pull milk from breast through NS and into mouth which is a helpful indicator with NS being a barrier. [name] student got baby latched in football hold on the right breast with the NS 20 and [name] said it was uncomfortable but over time the discomfort went away. Baby fed for 15 minutes and throughout the time [name] both had to stimulate baby to keep her actively sucking at the breast. While active at the breast babies lips were flanged and we heard a few swallows.   [facility] student explained she may cluster soon and for the family to call out if they need help. [name] felt more confident about this feeding and witnessed colostrum in the NS when baby was done which [facility] student fed to baby. [name] mentioned formula and LC student assured her that baby was doing normal things and getting used to being out of the womb but if she wanted to make that a part of her feeding plan then we can support that in any way. [facility] student also mentioned that if they got home and their plan changed but they needed support they could reach back out to [place] for support.  [name]  and [name] did not have any questions after the feeding and felt much better after a successful feed.  Maternal Data Has patient been taught Hand Expression?: Yes Does the patient have breastfeeding experience prior to this delivery?: No  Feeding Feeding Type: Breast Fed  LATCH Score Latch: Repeated attempts needed to sustain latch, nipple held in mouth throughout feeding, stimulation needed to elicit sucking reflex.  Audible Swallowing: A few with stimulation  Type of Nipple: Flat  Comfort (Breast/Nipple): Filling, red/small blisters or bruises, mild/mod discomfort  Hold (Positioning): No assistance needed to correctly position infant at breast.  LATCH Score: 6  Interventions Interventions: Breast feeding basics reviewed;Breast compression;Comfort gels;Assisted with latch;Skin to skin;Support pillows;Breast massage;Hand express  Lactation Tools Discussed/Used Tools: Nipple Shields Nipple shield size: 20(LC student felt 20 was a better fit)   Consult Status Consult Status: Follow-up Date: 02/08/20 Follow-up type: In-patient    Thersa Salt [facility] 02/08/2020, 12:47 PM

## 2020-02-08 NOTE — Lactation Note (Signed)
This note was copied from a baby's chart. Lactation Consultation Note  Patient Name: Natalie Welch [id number] Date: 02/08/2020 Reason for consult: Follow-up assessment  Upon entering the room, baby was asleep on [name]'s chest. [name] stated that things went well through the night and that the NS that was given was helpful. [name] said that her nipples were red after a feed but confirmed that when baby was latched she felt strong tugs and not pinching. [name] also mentioned she used coconut oil after feeds to assist with the redness. [facility] student suggested coming baby during next feed to see what [name] was referencing after a feed. [name] did want to know how she could determine if baby was getting what she needed. [facility] student explained that the output of baby is a good indicator and so far baby [name] had exceeded the amount of poops we expect within 24 hours. [facility] student also mentioned how baby is after a feed and if baby I satiated then baby got what they needed at the breast.   [facility] student also explained cluster feeding that may occur later today so [name] should get some rest while baby is sleep. [facility] student also listed some of the hunger cues, what the output expectations are for baby, and that baby should feed 8-12xs in 24 hours. [name] joined [place] from the shower and asked what he should do when baby bobs head while they are STS. [facility] student suggested passing baby to [name] so she could feed her. [facility] student encouraged them that they are doing a wonderful job and to call out during next feed so they could ask any feeding questions they may not be thinking of right now.  [name] had no further questions at this time but said they would call out for next feed.  Lactation Tools Discussed/Used Tools: Coconut oil;Nipple Shields Nipple shield size: 24   Consult Status Consult Status: Follow-up Date: 02/08/20 Follow-up type: In-patient    Thersa Salt [facility] 02/08/2020, 9:44 AM

## 2020-02-08 NOTE — Lactation Note (Signed)
This note was copied from a baby's chart. Lactation Consultation Note  Patient Name: Natalie Welch [id number] Date: 02/08/2020 Reason for consult: Follow-up assessment   Upon entering the room, [name] had baby in football on the left breast. LC student walked in and asked to adjust the position to support [name] better which mom agreed to because she said it was uncomfortable. [facility] student also unlatched baby because [name] said it was painful. [facility] student taught how to properly unlatch baby. [facility] student added more support pillows and put baby back in football to get her latched. [facility] student also taught hand expression to show [name]/ [name] that she was producing colostrum for baby. This excited both parents. [facility] student worked on Insurance claims handler without [name] being in pain. Baby was cueing but not actively sucking while at the breast unless stimulated. We took NS off to try to latch directly to breast and baby would latch and then pop back off. After several attempts the nurses came in to draw blood from [name].   [facility] student had to leave for [name] to get blood work but would be returning since baby was cueing to ensure that parents felt confident in this feeding.   Maternal Data Has patient been taught Hand Expression?: Yes Does the patient have breastfeeding experience prior to this delivery?: No  Feeding Feeding Type: Breast Fed  LATCH Score Latch: Repeated attempts needed to sustain latch, nipple held in mouth throughout feeding, stimulation needed to elicit sucking reflex.  Audible Swallowing: A few with stimulation  Type of Nipple: Flat  Comfort (Breast/Nipple): Filling, red/small blisters or bruises, mild/mod discomfort  Hold (Positioning): Assistance needed to correctly position infant at breast and maintain latch.  LATCH Score: 5  Interventions Interventions: Breast feeding basics reviewed;Breast compression;Assisted with latch;Adjust position;Skin to skin;Support pillows;Hand express  Lactation  Tools Discussed/Used Tools: Coconut oil;Nipple Shields Nipple shield size: 24   Consult Status Consult Status: Follow-up Date: 02/08/20 Follow-up type: In-patient    Thersa Salt [facility] 02/08/2020, 11:50 AM

## 2020-02-08 NOTE — Progress Notes (Signed)
Subjective:  Still feeling pretty sore.  Minimal lochia.  Ambulating, voiding, tolerating po.    Objective:  Vital signs in last 24 hours: Temp:  [97.7 F (36.5 C)-98.8 F (37.1 C)] 98.6 F (37 C) (04/14 0832) Pulse Rate:  [85-135] 99 (04/14 0832) Resp:  [12-30] 18 (04/14 0832) BP: (121-140)/(53-87) 127/72 (04/14 0832) SpO2:  [96 %-99 %] 99 % (04/14 0832)    Intake/Output      04/13 0701 - 04/14 0700 04/14 0701 - 04/15 0700   P.O. 440 120   I.V. (mL/kg) 960.2 (12.5)    Other 100    IV Piggyback 250    Total Intake(mL/kg) 1750.2 (22.8) 120 (1.6)   Urine (mL/kg/hr) 2725 (1.5)    Blood 694    Total Output 3419    Net -1668.8 +120          General: NAD Pulmonary: no increased work of breathing Abdomen: non-distended, non-tender, fundus firm at level of umbilicus Incision: D/C/I Extremities: no edema, no erythema, no tenderness  Results for orders placed or performed during the hospital encounter of 02/06/20 (from the past 72 hour(s))  CBC     Status: None   Collection Time: 02/06/20  8:30 AM  Result Value Ref Range   WBC 9.7 4.0 - 10.5 K/uL   RBC 4.25 3.87 - 5.11 MIL/uL   Hemoglobin 12.0 12.0 - 15.0 g/dL   HCT 36.7 36.0 - 46.0 %   MCV 86.4 80.0 - 100.0 fL   MCH 28.2 26.0 - 34.0 pg   MCHC 32.7 30.0 - 36.0 g/dL   RDW 14.6 11.5 - 15.5 %   Platelets 176 150 - 400 K/uL   nRBC 0.0 0.0 - 0.2 %    Comment: Performed at [facility], [address] [place], [place] [address]  RPR     Status: None   Collection Time: 02/06/20  8:30 AM  Result Value Ref Range   RPR Ser Ql NON REACTIVE NON REACTIVE    Comment: Performed at [facility], [place] [address], [place], [place] [address]  Type and screen     Status: None   Collection Time: 02/06/20  8:30 AM  Result Value Ref Range   ABO/RH(D) O POS    Antibody Screen NEG    Sample Expiration      02/09/2020,2359 Performed at [facility], [address] [place], [place] [address]   Comprehensive  metabolic panel     Status: Abnormal   Collection Time: 02/06/20  7:30 PM  Result Value Ref Range   Sodium 136 135 - 145 mmol/L   Potassium 3.4 (L) 3.5 - 5.1 mmol/L   Chloride 107 98 - 111 mmol/L   CO2 21 (L) 22 - 32 mmol/L   Glucose, Bld 82 70 - 99 mg/dL    Comment: Glucose reference range applies only to samples taken after fasting for at least 8 hours.   BUN 7 6 - 20 mg/dL   Creatinine, Ser 0.55 0.44 - 1.00 mg/dL   Calcium 8.7 (L) 8.9 - 10.3 mg/dL   Total Protein 6.2 (L) 6.5 - 8.1 g/dL   Albumin 2.9 (L) 3.5 - 5.0 g/dL   AST 23 15 - 41 U/L   ALT 13 0 - 44 U/L   Alkaline Phosphatase 120 38 - 126 U/L   Total Bilirubin 0.3 0.3 - 1.2 mg/dL   GFR calc non Af Amer >60 >60 mL/min   GFR calc Af Amer >60 >60 mL/min  Anion gap 8 5 - 15    Comment: Performed at [facility] [place], [address] [place], [place] [address]  Protein / creatinine ratio, urine     Status: Abnormal   Collection Time: 02/06/20  9:04 PM  Result Value Ref Range   Creatinine, Urine 124 mg/dL   Total Protein, Urine 28 mg/dL    Comment: NO NORMAL RANGE ESTABLISHED FOR THIS TEST   Protein Creatinine Ratio 0.23 (H) 0.00 - 0.15 mg/mg[Cre]    Comment: Performed at [facility], [address] [place], [place] [address]  CBC     Status: Abnormal   Collection Time: 02/08/20  6:35 AM  Result Value Ref Range   WBC 15.1 (H) 4.0 - 10.5 K/uL   RBC 1.99 (L) 3.87 - 5.11 MIL/uL   Hemoglobin 5.8 (L) 12.0 - 15.0 g/dL    Comment: REPEATED TO VERIFY   HCT 17.5 (L) 36.0 - 46.0 %   MCV 87.9 80.0 - 100.0 fL   MCH 29.1 26.0 - 34.0 pg   MCHC 33.1 30.0 - 36.0 g/dL   RDW 25.8 52.7 - 78.2 %   Platelets 159 150 - 400 K/uL   nRBC 0.0 0.0 - 0.2 %    Comment: Performed at [facility], [address] [place], [place] [address]  CBC     Status: Abnormal   Collection Time: 02/08/20  7:59 AM  Result Value Ref Range   WBC 15.4 (H) 4.0 - 10.5 K/uL   RBC 1.87 (L) 3.87 - 5.11 MIL/uL   Hemoglobin 6.2 (L) 12.0 -  15.0 g/dL   HCT 61.4 (L) 43.1 - 54.0 %   MCV 96.3 80.0 - 100.0 fL    Comment: REPEATED TO VERIFY   MCH 33.2 26.0 - 34.0 pg   MCHC 34.4 30.0 - 36.0 g/dL   RDW 08.6 (H) 76.1 - 95.0 %   Platelets 170 150 - 400 K/uL   nRBC 0.0 0.0 - 0.2 %    Comment: Performed at [facility], [address], [place], [place] [address]    Immunization History  Administered Date(s) Administered  . Tdap 12/20/2019    Assessment:   29 y.o. G2P1011 postoperativeday # 1 1LTCS for CPD   Plan:  ) Acute blood loss anemia - hemodynamically stable but below 7.0 on Hgb - po ferrous sulfate - transfuse 1U of pRBC  2) Blood Type --/--/O POS (04/12 0830) / Rubella 1.98 (09/21 1430) / Varicella Immune  3) TDAP status up to date  4) Feeding plan breast  5)  Education given regarding options for contraception, as well as compatibility with breast feeding if applicable.    6) Disposition - anticipate discharge POD2-3  Vena Austria, MD, [facility], [facility] 02/08/2020, 11:04 AM

## 2020-02-08 NOTE — Progress Notes (Addendum)
Obstetric Postpartum/PostOperative Daily Progress Note Subjective:  29 y.o. G2P1011 post-operative day # 1 status post primary cesarean delivery.  She is ambulating, is tolerating po, is voiding spontaneously.  Her pain is well controlled on PO pain medications and On Q pump. Her lochia is less than menses. She reports breastfeeding is going well with assistance from lactation especially since using nipple shields. She denies any light headed or dizziness feelings.   Medications SCHEDULED MEDICATIONS  . ferrous sulfate  325 mg Oral BID WC  . ibuprofen  600 mg Oral Q6H  . pantoprazole  40 mg Oral Daily  . prenatal multivitamin  1 tablet Oral Q1200  . senna-docusate  2 tablet Oral Q24H  . simethicone  80 mg Oral TID PC    MEDICATION INFUSIONS  . lactated ringers Stopped (02/08/20 0110)    PRN MEDICATIONS  coconut oil, witch hazel-glycerin **AND** dibucaine, diphenhydrAMINE, ketorolac **OR** ketorolac, menthol-cetylpyridinium, ondansetron (ZOFRAN) IV, oxyCODONE-acetaminophen **OR** oxyCODONE-acetaminophen, [DISCONTINUED] naloxone **AND** sodium chloride flush    Objective:   Vitals:   02/08/20 0100 02/08/20 0259 02/08/20 0500 02/08/20 0832  BP:  123/68  127/72  Pulse: 85 93  99  Resp:  18  18  Temp:  98.5 F (36.9 C)  98.6 F (37 C)  TempSrc:  Oral  Oral  SpO2: 96% 98% 98% 99%  Weight:      Height:        Current Vital Signs 24h Vital Sign Ranges  T 98.6 F (37 C) Temp  Avg: 98.4 F (36.9 C)  Min: 97.7 F (36.5 C)  Max: 98.8 F (37.1 C)  BP 127/72 BP  Min: 121/76  Max: 140/68  HR 99 Pulse  Avg: 106  Min: 85  Max: 135  RR 18 Resp  Avg: 20.2  Min: 12  Max: 30  SaO2 99 % Room Air SpO2  Avg: 97.5 %  Min: 96 %  Max: 99 %       24 Hour I/O Current Shift I/O  Time Ins Outs 04/13 0701 - 04/14 0700 In: 1750.2 [P.O.:440; I.V.:960.2] Out: 3419 [Urine:2725] 04/14 0701 - 04/14 1900 In: 120 [P.O.:120] Out: -   General: NAD Pulmonary: no increased work of breathing Abdomen:  non-distended, non-tender, fundus firm at level of umbilicus Inc: Clean/dry/intact, on q pump intact Extremities: no edema, no erythema, no tenderness  Labs:  Recent Labs  Lab 02/06/20 0830 02/08/20 0635 02/08/20 0759  WBC 9.7 15.1* 15.4*  HGB 12.0 5.8* 6.2*  HCT 36.7 17.5* 18.0*  PLT 176 159 170     Assessment:   28 y.o. G2P1011 postoperative day # 1 status post primary cesarean section, lactating  Plan:  1) Acute blood loss anemia - hemodynamically stable and asymptomatic - po ferrous sulfate, repeat CBC in am, continue recording I&O   2) O POS / Rubella 1.98 (09/21 1430)/ Varicella Immune  3) TDAP status UTD  4) breast /Contraception = condoms  5) Disposition: continue current care   Tresea Mall, CNM 02/08/2020 10:53 AM

## 2020-02-08 NOTE — Clinical Social Work Maternal (Signed)
CLINICAL SOCIAL WORK MATERNAL/CHILD NOTE  Patient Details  Name: Natalie Welch MRN: [id number] Date of Birth: 1991-05-02  Date:  02/08/2020  Clinical Social Worker Initiating Note:  Oleh Genin, LCSW Date/Time: Initiated:  02/08/20/      Child's Name:  Natalie Welch   Biological Parents:  Father, Mother   Need for Interpreter:  None   Reason for Referral:  Other (Comment)(Edinburgh Score of 12.)   Address:  [place] [address]    Phone number:  [phone] (home)     Additional phone number: None reported  Household Members/Support Persons (HM/SP):   Household Member/Support Person 1   HM/SP Name Relationship DOB or Age  HM/SP -1 Newell Frater spouse/[name] unknown  HM/SP -2        HM/SP -3        HM/SP -4        HM/SP -5        HM/SP -6        HM/SP -7        HM/SP -8          Natural Supports (not living in the home):  Extended Family, Friends, Immediate Family, Spouse/significant other   Professional Supports:     Employment: Animator   Type of Work: Engineer, production   Education:  [employer] school graduate   Homebound arranged:    Museum/gallery curator Resources:      Other Resources:      Cultural/Religious Considerations Which May Impact Care:  None reported  Strengths:  Ability to meet basic needs , Compliance with medical plan , Pediatrician chosen, Understanding of illness, Home prepared for child    Psychotropic Medications:         Pediatrician:    [facility]  Pediatrician List:   [facility]      Pediatrician Fax Number:    Risk Factors/Current Problems:      Cognitive State:  Able to Concentrate , Alert , Goal Oriented    Mood/Affect:  Calm , Happy    CSW Assessment: CSW was consulted due to [place] Score of 12. Per chart review, [name] also has history of anxiety and panic attacks.  CSW met with [name] at bedside. Explained HIPPA and [name] elected for [name] Ovid Curd) to remain at bedside. CSW explained role and reason for referral.   [name] reported she is feeling good since delivering baby [name]. [name] reported she has a history of anxiety, depression, and panic attacks. Her panic attacks were escalated during pregnancy due to family conflicts that were going on at the time. [name] reported she was prescribed a PRN medication due to this and felt it was effective. [name] reported she has seen a counselor in the past, is not currently involved in therapy, but is aware of resources if she needs additional support. [name] reported she feels she has a good support system including [name]. CSW explained and provided information sheets on PPD and SIDS and both [name] and [name] verbalized understanding. Encouraged [name] to reach out to her Provider with any questions or needs for support, even after discharge.   [name] has a new carseat, crib, and all other items needed for Baby. [name] drives and has reliable means of transportation. [name] plans to use [facility] for Baby [name]'s medical care. [name] denied any needs at this time and was encouraged  to reach out if any arise.    CSW Plan/Description:  Perinatal Mood and Anxiety Disorder (PMADs) Education, Sudden Infant Death Syndrome (SIDS) Education, No Further Intervention Required/No Barriers to Discharge    [place], LCSW 02/08/2020, 1:19 PM

## 2020-02-09 LAB — CBC
HCT: 19.7 % — ABNORMAL LOW (ref 36.0–46.0)
HCT: 24.8 % — ABNORMAL LOW (ref 36.0–46.0)
Hemoglobin: 6.7 g/dL — ABNORMAL LOW (ref 12.0–15.0)
Hemoglobin: 8.4 g/dL — ABNORMAL LOW (ref 12.0–15.0)
MCH: 29.4 pg (ref 26.0–34.0)
MCH: 29.2 pg (ref 26.0–34.0)
MCHC: 34 g/dL (ref 30.0–36.0)
MCHC: 33.9 g/dL (ref 30.0–36.0)
MCV: 86.4 fL (ref 80.0–100.0)
MCV: 86.1 fL (ref 80.0–100.0)
Platelets: 173 10*3/uL (ref 150–400)
Platelets: 191 10*3/uL (ref 150–400)
RBC: 2.28 MIL/uL — ABNORMAL LOW (ref 3.87–5.11)
RBC: 2.88 MIL/uL — ABNORMAL LOW (ref 3.87–5.11)
RDW: 15.1 % (ref 11.5–15.5)
RDW: 14.6 % (ref 11.5–15.5)
WBC: 12.7 10*3/uL — ABNORMAL HIGH (ref 4.0–10.5)
WBC: 11.9 10*3/uL — ABNORMAL HIGH (ref 4.0–10.5)
nRBC: 0 % (ref 0.0–0.2)
nRBC: 0 % (ref 0.0–0.2)

## 2020-02-09 LAB — PREPARE RBC (CROSSMATCH)

## 2020-02-09 MED ORDER — IBUPROFEN 600 MG PO TABS: 600.0000 mg | ORAL_TABLET | Freq: Four times a day (QID) | ORAL | 0 refills | Status: DC

## 2020-02-09 MED ORDER — FERROUS SULFATE 325 (65 FE) MG PO TABS: 325.0000 mg | ORAL_TABLET | Freq: Every day | ORAL | 3 refills | Status: DC

## 2020-02-09 MED ORDER — OXYCODONE-ACETAMINOPHEN 5-325 MG PO TABS: 1.0000 | ORAL_TABLET | ORAL | 0 refills | Status: DC | PRN

## 2020-02-09 MED ORDER — SODIUM CHLORIDE 0.9% IV SOLUTION: Freq: Once | INTRAVENOUS | Status: AC

## 2020-02-09 MED ORDER — DIPHENHYDRAMINE HCL 25 MG PO CAPS
25.0000 mg | ORAL_CAPSULE | Freq: Once | ORAL | Status: AC
  Administered 2020-02-09: 10:00:00 25 mg via ORAL
  Filled 2020-02-09: qty 1

## 2020-02-09 NOTE — Progress Notes (Signed)
DC inst reviewed; reviewed removing On Q pump.  Verb u/o of f/u appt.

## 2020-02-09 NOTE — Progress Notes (Signed)
DC to home to car via wc

## 2020-02-09 NOTE — Progress Notes (Signed)
Subjective:  Doing well no concerns.  Sore but pain not out of proportion for recent C-section.  Minimal lochia.  Voiding, ambulating, and toelrating po.    Objective:  Vital signs in last 24 hours: Temp:  [98 F (36.7 C)-98.8 F (37.1 C)] 98.7 F (37.1 C) (04/15 0818) Pulse Rate:  [88-105] 93 (04/15 0818) Resp:  [18-20] 20 (04/15 0818) BP: (120-140)/(65-86) 140/73 (04/15 0818) SpO2:  [96 %-99 %] 99 % (04/15 0818)    Intake/Output      04/14 0701 - 04/15 0700 04/15 0701 - 04/16 0700   P.O. 375    I.V. (mL/kg) 40 (0.5)    Blood 300    Other     IV Piggyback     Total Intake(mL/kg) 715 (9.3)    Urine (mL/kg/hr) 1400 (0.8)    Blood     Total Output 1400    Net -685           General: NAD Pulmonary: no increased work of breathing Abdomen: non-distended, non-tender, fundus firm at level of umbilicus Incision: D/C/I Extremities: no edema, no erythema, no tenderness  Results for orders placed or performed during the hospital encounter of 02/06/20 (from the past 72 hour(s))  Comprehensive metabolic panel     Status: Abnormal   Collection Time: 02/06/20  7:30 PM  Result Value Ref Range   Sodium 136 135 - 145 mmol/L   Potassium 3.4 (L) 3.5 - 5.1 mmol/L   Chloride 107 98 - 111 mmol/L   CO2 21 (L) 22 - 32 mmol/L   Glucose, Bld 82 70 - 99 mg/dL    Comment: Glucose reference range applies only to samples taken after fasting for at least 8 hours.   BUN 7 6 - 20 mg/dL   Creatinine, Ser 4.16 0.44 - 1.00 mg/dL   Calcium 8.7 (L) 8.9 - 10.3 mg/dL   Total Protein 6.2 (L) 6.5 - 8.1 g/dL   Albumin 2.9 (L) 3.5 - 5.0 g/dL   AST 23 15 - 41 U/L   ALT 13 0 - 44 U/L   Alkaline Phosphatase 120 38 - 126 U/L   Total Bilirubin 0.3 0.3 - 1.2 mg/dL   GFR calc non Af Amer >60 >60 mL/min   GFR calc Af Amer >60 >60 mL/min   Anion gap 8 5 - 15    Comment: Performed at [facility], [address] [place], [place] [address]  Protein / creatinine ratio, urine     Status: Abnormal   Collection Time: 02/06/20  9:04 PM  Result Value Ref Range   Creatinine, Urine 124 mg/dL   Total Protein, Urine 28 mg/dL    Comment: NO NORMAL RANGE ESTABLISHED FOR THIS TEST   Protein Creatinine Ratio 0.23 (H) 0.00 - 0.15 mg/mg[Cre]    Comment: Performed at [facility], [address] [place], [place] [address]  CBC     Status: Abnormal   Collection Time: 02/08/20  6:35 AM  Result Value Ref Range   WBC 15.1 (H) 4.0 - 10.5 K/uL   RBC 1.99 (L) 3.87 - 5.11 MIL/uL   Hemoglobin 5.8 (L) 12.0 - 15.0 g/dL    Comment: REPEATED TO VERIFY   HCT 17.5 (L) 36.0 - 46.0 %   MCV 87.9 80.0 - 100.0 fL   MCH 29.1 26.0 - 34.0 pg   MCHC 33.1 30.0 - 36.0 g/dL   RDW 93.2 35.5 - 73.2 %   Platelets 159 150 - 400 K/uL   nRBC  0.0 0.0 - 0.2 %    Comment: Performed at [facility], [address] [place], [place] [address]  CBC     Status: Abnormal   Collection Time: 02/08/20  7:59 AM  Result Value Ref Range   WBC 15.4 (H) 4.0 - 10.5 K/uL   RBC 1.87 (L) 3.87 - 5.11 MIL/uL   Hemoglobin 6.2 (L) 12.0 - 15.0 g/dL   HCT 18.0 (L) 36.0 - 46.0 %   MCV 96.3 80.0 - 100.0 fL    Comment: REPEATED TO VERIFY   MCH 33.2 26.0 - 34.0 pg   MCHC 34.4 30.0 - 36.0 g/dL   RDW 18.1 (H) 11.5 - 15.5 %   Platelets 170 150 - 400 K/uL   nRBC 0.0 0.0 - 0.2 %    Comment: Performed at [facility], [address] [place], [place] [address]  Prepare RBC (crossmatch)     Status: None   Collection Time: 02/08/20 11:04 AM  Result Value Ref Range   Order Confirmation      ORDER PROCESSED BY BLOOD BANK Performed at [facility], [address] [place], [place] [address]   CBC with Differential/Platelet     Status: Abnormal   Collection Time: 02/08/20  6:05 PM  Result Value Ref Range   WBC 15.5 (H) 4.0 - 10.5 K/uL   RBC 2.25 (L) 3.87 - 5.11 MIL/uL   Hemoglobin 6.6 (L) 12.0 - 15.0 g/dL   HCT 19.5 (L) 36.0 - 46.0 %   MCV 86.7 80.0 - 100.0 fL   MCH 29.3 26.0 - 34.0 pg   MCHC 33.8 30.0 -  36.0 g/dL   RDW 14.6 11.5 - 15.5 %   Platelets 148 (L) 150 - 400 K/uL   nRBC 0.0 0.0 - 0.2 %   Neutrophils Relative % 79 %   Neutro Abs 12.3 (H) 1.7 - 7.7 K/uL   Lymphocytes Relative 11 %   Lymphs Abs 1.8 0.7 - 4.0 K/uL   Monocytes Relative 8 %   Monocytes Absolute 1.2 (H) 0.1 - 1.0 K/uL   Eosinophils Relative 1 %   Eosinophils Absolute 0.1 0.0 - 0.5 K/uL   Basophils Relative 0 %   Basophils Absolute 0.0 0.0 - 0.1 K/uL   Immature Granulocytes 1 %   Abs Immature Granulocytes 0.12 (H) 0.00 - 0.07 K/uL    Comment: Performed at [facility], [address] [place], [place] [address]  CBC     Status: Abnormal   Collection Time: 02/09/20  6:31 AM  Result Value Ref Range   WBC 12.7 (H) 4.0 - 10.5 K/uL   RBC 2.28 (L) 3.87 - 5.11 MIL/uL   Hemoglobin 6.7 (L) 12.0 - 15.0 g/dL   HCT 19.7 (L) 36.0 - 46.0 %   MCV 86.4 80.0 - 100.0 fL   MCH 29.4 26.0 - 34.0 pg   MCHC 34.0 30.0 - 36.0 g/dL   RDW 15.1 11.5 - 15.5 %   Platelets 173 150 - 400 K/uL   nRBC 0.0 0.0 - 0.2 %    Comment: Performed at [facility], [address], [place], [place] [address]    Immunization History  Administered Date(s) Administered  . Tdap 12/20/2019    Assessment:   29 y.o. G2P1011 postoperativeday # 2 1LTCS for CPD   Plan:  ) Acute blood loss anemia - hemodynamically stable and asymptomatic - Hgb remain below 7.0, given risk for continued blood loss in postpartum period and no source for  Hgb drop will given on additional unit.  Her Hgb at 28 week was 9.3 so she may have been dehydrated and hemoconcentrated on admission given minimal blood loss and otherwise benign exam - po ferrous sulfate - 1 additional unit of pRBC  2) Blood Type --/--/O POS (04/12 0830) / Rubella 1.98 (09/21 1430) / Varicella Immune  3) TDAP status up to date  4) Feeding plan breast  5)  Education given regarding options for contraception, as well as compatibility with breast feeding if applicable.  Patient plans  on condoms for contraception.  6) Disposition - anticipate discharge POD2 if post transfusion CBC is above 7.0  Vena Austria, MD, [facility], [facility] 02/09/2020, 8:59 AM

## 2020-02-09 NOTE — Anesthesia Postprocedure Evaluation (Signed)
Anesthesia Post Note  Patient: Natalie Welch  Procedure(s) Performed: CESAREAN SECTION (N/A )  Patient location during evaluation: Mother Baby Anesthesia Type: Spinal Level of consciousness: oriented and awake and alert Pain management: pain level controlled Vital Signs Assessment: post-procedure vital signs reviewed and stable Respiratory status: spontaneous breathing and respiratory function stable Cardiovascular status: blood pressure returned to baseline and stable Postop Assessment: no headache, no backache, no apparent nausea or vomiting and able to ambulate Anesthetic complications: no     Last Vitals:  Vitals:   02/08/20 1522 02/08/20 2323  BP: 132/72 130/72  Pulse: 88 90  Resp: 18 18  Temp: 37.1 C 36.7 C  SpO2: 98% 96%    Last Pain:  Vitals:   02/09/20 0605  TempSrc:   PainSc: 3                  Lynden Oxford

## 2020-02-10 LAB — TYPE AND SCREEN
ABO/RH(D): O POS
Antibody Screen: NEGATIVE
Unit division: 0
Unit division: 0

## 2020-02-10 LAB — BPAM RBC
Blood Product Expiration Date: 202105112359
Blood Product Expiration Date: 202105192359
ISSUE DATE / TIME: 202104141139
ISSUE DATE / TIME: 202104151205
Unit Type and Rh: 5100
Unit Type and Rh: 5100

## 2020-02-13 ENCOUNTER — Telehealth: Payer: Self-pay | Admitting: Obstetrics and Gynecology

## 2020-02-13 NOTE — Telephone Encounter (Signed)
Pt supposed to have an incision check but was exposed to covid recently. Would video chat be ok or how would you like [place] to schedule her? I mentioned mychart video, I also asked how her incision was and the only compliant she had was they it burned every so often.  Please advise. Thank you

## 2020-02-14 NOTE — Telephone Encounter (Signed)
So we can do a video or telephone if she can upload a picture of the incision on my chart

## 2020-02-14 NOTE — Telephone Encounter (Signed)
PT is scheduled

## 2020-02-17 ENCOUNTER — Other Ambulatory Visit: Payer: Self-pay

## 2020-02-17 ENCOUNTER — Telehealth (INDEPENDENT_AMBULATORY_CARE_PROVIDER_SITE_OTHER): Payer: BC Managed Care – PPO | Admitting: Obstetrics and Gynecology

## 2020-02-17 DIAGNOSIS — Z4889 Encounter for other specified surgical aftercare: Secondary | ICD-10-CM

## 2020-02-17 NOTE — Progress Notes (Signed)
I connected with Natalie Welch  on 02/23/20 at  4:10 PM EDT by telephone and verified that I am speaking with the correct person using two identifiers.   I discussed the limitations, risks, security and privacy concerns of performing an evaluation and management service by telephone and the availability of in person appointments. I also discussed with the patient that there may be a patient responsible charge related to this service. The patient expressed understanding and agreed to proceed.  The patient was at home I spoke with the patient from my workstation phone The names of people involved in this encounter were: Natalie Welch , and Vena Austria    Postoperative Follow-up Patient presents post op from [employer] ago for failture to progress.  Subjective: Patient reports marked improvement in her preop symptoms. Eating a regular diet without difficulty. Pain is controlled with current analgesics. Medications being used: prescription NSAID's including ibuprofen and narcotic analgesics including oxycodone/acetaminophen (Percocet, Tylox).  Activity: normal activities of daily living.  Objective: Last menstrual period 05/04/2019, unknown if currently breastfeeding.  No physical exam as this was a remote telephone visit to promote social distancing during the current COVID-19 Pandemic    Admission on 02/06/2020, Discharged on 02/09/2020  Component Date Value Ref Range Status  . WBC 02/06/2020 9.7  4.0 - 10.5 K/uL Final  . RBC 02/06/2020 4.25  3.87 - 5.11 MIL/uL Final  . Hemoglobin 02/06/2020 12.0  12.0 - 15.0 g/dL Final  . HCT 13/24/4010 36.7  36.0 - 46.0 % Final  . MCV 02/06/2020 86.4  80.0 - 100.0 fL Final  . MCH 02/06/2020 28.2  26.0 - 34.0 pg Final  . MCHC 02/06/2020 32.7  30.0 - 36.0 g/dL Final  . RDW 27/25/3664 14.6  11.5 - 15.5 % Final  . Platelets 02/06/2020 176  150 - 400 K/uL Final  . nRBC 02/06/2020 0.0  0.0 - 0.2 % Final   Performed at [facility], [address], [place], [place] [address]  . RPR Ser Ql 02/06/2020 NON REACTIVE  NON REACTIVE Final   Performed at [facility], [address], [place], [place] [address]  . ABO/RH(D) 02/06/2020 O POS   Final  . Antibody Screen 02/06/2020 NEG   Final  . Sample Expiration 02/06/2020 02/09/2020,2359   Final  . Unit Number 02/06/2020 [id number]   Final  . Blood Component Type 02/06/2020 RBC LR [id number]   Final  . Unit division 02/06/2020 00   Final  . Status of Unit 02/06/2020 ISSUED,FINAL   Final  . Transfusion Status 02/06/2020 OK TO TRANSFUSE   Final  . Crossmatch Result 02/06/2020 Compatible   Final  . Unit Number 02/06/2020 [id number]   Final  . Blood Component Type 02/06/2020 RED CELLS,LR   Final  . Unit division 02/06/2020 00   Final  . Status of Unit 02/06/2020 ISSUED,FINAL   Final  . Transfusion Status 02/06/2020 OK TO TRANSFUSE   Final  . Crossmatch Result 02/06/2020    Final                   Value:Compatible Performed at [facility], [address], [place], [place] [address]   . Sodium 02/06/2020 136  135 - 145 mmol/L Final  . Potassium 02/06/2020 3.4* 3.5 - 5.1 mmol/L Final  . Chloride 02/06/2020 107  98 - 111 mmol/L Final  . CO2 02/06/2020 21* 22 - 32 mmol/L Final  . Glucose, Bld 02/06/2020 82  70 -  99 mg/dL Final   Glucose reference range applies only to samples taken after fasting for at least 8 hours.  . BUN 02/06/2020 7  6 - 20 mg/dL Final  . Creatinine, Ser 02/06/2020 0.55  0.44 - 1.00 mg/dL Final  . Calcium 14/48/1856 8.7* 8.9 - 10.3 mg/dL Final  . Total Protein 02/06/2020 6.2* 6.5 - 8.1 g/dL Final  . Albumin 31/49/7026 2.9* 3.5 - 5.0 g/dL Final  . AST 37/85/8850 23  15 - 41 U/L Final  . ALT 02/06/2020 13  0 - 44 U/L Final  . Alkaline Phosphatase 02/06/2020 120  38 - 126 U/L Final  . Total Bilirubin 02/06/2020 0.3  0.3 - 1.2 mg/dL Final  . GFR calc non Af Amer 02/06/2020 >60  >60 mL/min Final  . GFR calc Af Amer 02/06/2020 >60  >60  mL/min Final  . Anion gap 02/06/2020 8  5 - 15 Final   Performed at [facility], [address] [place], [place] [address]  . Creatinine, Urine 02/06/2020 124  mg/dL Final  . Total Protein, Urine 02/06/2020 28  mg/dL Final   NO NORMAL RANGE ESTABLISHED FOR THIS TEST  . Protein Creatinine Ratio 02/06/2020 0.23* 0.00 - 0.15 mg/mg[Cre] Final   Performed at [facility], [address] [place], [place] [address]  . WBC 02/08/2020 15.1* 4.0 - 10.5 K/uL Final  . RBC 02/08/2020 1.99* 3.87 - 5.11 MIL/uL Final  . Hemoglobin 02/08/2020 5.8* 12.0 - 15.0 g/dL Final   REPEATED TO VERIFY  . HCT 02/08/2020 17.5* 36.0 - 46.0 % Final  . MCV 02/08/2020 87.9  80.0 - 100.0 fL Final  . MCH 02/08/2020 29.1  26.0 - 34.0 pg Final  . MCHC 02/08/2020 33.1  30.0 - 36.0 g/dL Final  . RDW 76/72/0947 15.1  11.5 - 15.5 % Final  . Platelets 02/08/2020 159  150 - 400 K/uL Final  . nRBC 02/08/2020 0.0  0.0 - 0.2 % Final   Performed at [facility], [address], [place], [place] [address]  . WBC 02/08/2020 15.4* 4.0 - 10.5 K/uL Final  . RBC 02/08/2020 1.87* 3.87 - 5.11 MIL/uL Final  . Hemoglobin 02/08/2020 6.2* 12.0 - 15.0 g/dL Final  . HCT 36/62/9476 18.0* 36.0 - 46.0 % Final  . MCV 02/08/2020 96.3  80.0 - 100.0 fL Final   REPEATED TO VERIFY  . MCH 02/08/2020 33.2  26.0 - 34.0 pg Final  . MCHC 02/08/2020 34.4  30.0 - 36.0 g/dL Final  . RDW 54/65/0354 18.1* 11.5 - 15.5 % Final  . Platelets 02/08/2020 170  150 - 400 K/uL Final  . nRBC 02/08/2020 0.0  0.0 - 0.2 % Final   Performed at [facility], [address], [place], [place] [address]  . Order Confirmation 02/08/2020    Final                   Value:ORDER PROCESSED BY BLOOD BANK Performed at [facility], [address] [place], [place] [address]   . ISSUE DATE / TIME 02/06/2020 [id number]   Final  . Blood Product Unit Number 02/06/2020 [id number]   Final  . PRODUCT CODE 02/06/2020 [id number]   Final    . Unit Type and Rh 02/06/2020 5100   Final  . Blood Product Expiration Date 02/06/2020 [id number]   Final  . ISSUE DATE / TIME 02/06/2020 [id number]   Final  . Blood Product Unit Number 02/06/2020 [id number]   Final  . PRODUCT CODE  02/06/2020 [id number]   Final  . Unit Type and Rh 02/06/2020 5100   Final  . Blood Product Expiration Date 02/06/2020 [id number]   Final  . WBC 02/08/2020 15.5* 4.0 - 10.5 K/uL Final  . RBC 02/08/2020 2.25* 3.87 - 5.11 MIL/uL Final  . Hemoglobin 02/08/2020 6.6* 12.0 - 15.0 g/dL Final  . HCT 17/00/1749 19.5* 36.0 - 46.0 % Final  . MCV 02/08/2020 86.7  80.0 - 100.0 fL Final  . MCH 02/08/2020 29.3  26.0 - 34.0 pg Final  . MCHC 02/08/2020 33.8  30.0 - 36.0 g/dL Final  . RDW 44/96/7591 14.6  11.5 - 15.5 % Final  . Platelets 02/08/2020 148* 150 - 400 K/uL Final  . nRBC 02/08/2020 0.0  0.0 - 0.2 % Final  . Neutrophils Relative % 02/08/2020 79  % Final  . Neutro Abs 02/08/2020 12.3* 1.7 - 7.7 K/uL Final  . Lymphocytes Relative 02/08/2020 11  % Final  . Lymphs Abs 02/08/2020 1.8  0.7 - 4.0 K/uL Final  . Monocytes Relative 02/08/2020 8  % Final  . Monocytes Absolute 02/08/2020 1.2* 0.1 - 1.0 K/uL Final  . Eosinophils Relative 02/08/2020 1  % Final  . Eosinophils Absolute 02/08/2020 0.1  0.0 - 0.5 K/uL Final  . Basophils Relative 02/08/2020 0  % Final  . Basophils Absolute 02/08/2020 0.0  0.0 - 0.1 K/uL Final  . Immature Granulocytes 02/08/2020 1  % Final  . Abs Immature Granulocytes 02/08/2020 0.12* 0.00 - 0.07 K/uL Final   Performed at [facility], [address] [place], [place] [address]  . WBC 02/09/2020 12.7* 4.0 - 10.5 K/uL Final  . RBC 02/09/2020 2.28* 3.87 - 5.11 MIL/uL Final  . Hemoglobin 02/09/2020 6.7* 12.0 - 15.0 g/dL Final  . HCT 65/99/3570 19.7* 36.0 - 46.0 % Final  . MCV 02/09/2020 86.4  80.0 - 100.0 fL Final  . MCH 02/09/2020 29.4  26.0 - 34.0 pg Final  . MCHC 02/09/2020 34.0  30.0 - 36.0 g/dL Final  . RDW 17/79/3903 15.1   11.5 - 15.5 % Final  . Platelets 02/09/2020 173  150 - 400 K/uL Final  . nRBC 02/09/2020 0.0  0.0 - 0.2 % Final   Performed at [facility], [address], [place], [place] [address]  . Order Confirmation 02/09/2020    Final                   Value:ORDER PROCESSED BY BLOOD BANK Performed at [facility], [address] [place], [place] [address]   . WBC 02/09/2020 11.9* 4.0 - 10.5 K/uL Final  . RBC 02/09/2020 2.88* 3.87 - 5.11 MIL/uL Final  . Hemoglobin 02/09/2020 8.4* 12.0 - 15.0 g/dL Final   POST TRANSFUSION SPECIMEN  . HCT 02/09/2020 24.8* 36.0 - 46.0 % Final  . MCV 02/09/2020 86.1  80.0 - 100.0 fL Final  . MCH 02/09/2020 29.2  26.0 - 34.0 pg Final  . MCHC 02/09/2020 33.9  30.0 - 36.0 g/dL Final  . RDW 22/63/3354 14.6  11.5 - 15.5 % Final  . Platelets 02/09/2020 191  150 - 400 K/uL Final  . nRBC 02/09/2020 0.0  0.0 - 0.2 % Final   Performed at [facility], [address] [place], [place] [address]    Assessment: 29 y.o. s/p LTCS stable  Plan: Patient has done well after surgery with no apparent complications.  I have discussed the post-operative course to date, and the expected progress moving forward.  The  patient understands what complications to be concerned about.  I will see the patient in routine follow up, or sooner if needed.    Activity plan: No heavy lifting.    video visit 11 min  Malachy Mood, MD, [place], [facility] 02/17/2020, 5:04 PM

## 2020-02-23 ENCOUNTER — Encounter: Payer: Self-pay | Admitting: Obstetrics and Gynecology

## 2020-02-23 ENCOUNTER — Ambulatory Visit (INDEPENDENT_AMBULATORY_CARE_PROVIDER_SITE_OTHER): Payer: BC Managed Care – PPO | Admitting: Obstetrics and Gynecology

## 2020-02-23 ENCOUNTER — Other Ambulatory Visit: Payer: Self-pay

## 2020-02-23 VITALS — BP 157/105 | Wt 141.0 lb

## 2020-02-23 DIAGNOSIS — Z09 Encounter for follow-up examination after completed treatment for conditions other than malignant neoplasm: Secondary | ICD-10-CM

## 2020-02-23 NOTE — Progress Notes (Signed)
   Postoperative Follow-up Patient presents post op from cesarean section  2 weeks ago.  Subjective: She denies fever, chills, nausea and vomiting. Eating a regular diet without difficulty. She has some pain on her right lower abdomen about 2 inches above the incision line.   Activity: normal activities of daily living. She denies issues with her incision.    She had an EPDS score of 12 at discharge. She states that she had some family events which added to her stress and anxiety levels leading up to her delivery.  She states that she is doing much better today and has no concern.   Objective: BP (!) 157/105   Wt 141 lb (64 kg)   LMP 05/04/2019   BMI 26.64 kg/m   Constitutional: Well nourished, well developed female in no acute distress.  HEENT: normal Skin: Warm and dry.  Abdomen: s/nt/nd, uterine fundus at U-3 and firm.  No masses or subcutaneous fluid collections noted. clean, dry, intact and without erythema, induration, warmth, and tenderness Extremity: no edema   EPDS today: 6 (12 at discharge)  Assessment: 29 y.o. s/p cesarean section progressing well  Plan: Patient has done well after surgery with no apparent complications.  I have discussed the post-operative course to date, and the expected progress moving forward.  The patient understands what complications to be concerned about.    Activity plan: increase slowly. Specific activities discussed.   Continue to monitor for signs of postpartum depression.  Doing well at this time. Undecided about contraception. She is formula feeding at this time. Baby Rusty Aus is doing well.   Return in about 4 weeks (around 03/22/2020) for Six Week Postpartum.  Thomasene Mohair, MD 02/23/2020 12:22 PM

## 2020-03-23 ENCOUNTER — Ambulatory Visit (INDEPENDENT_AMBULATORY_CARE_PROVIDER_SITE_OTHER): Payer: BC Managed Care – PPO | Admitting: Obstetrics and Gynecology

## 2020-03-23 ENCOUNTER — Other Ambulatory Visit: Payer: Self-pay

## 2020-03-23 ENCOUNTER — Encounter: Payer: Self-pay | Admitting: Obstetrics and Gynecology

## 2020-03-23 NOTE — Progress Notes (Signed)
Postpartum Visit  Chief Complaint:  Chief Complaint  Patient presents with  . Postpartum Care    History of Present Illness: Patient is a 29 y.o. G2P1011 presents for postpartum visit.  Date of delivery:02/07/2020 Type of delivery: C-Section Pregnancy or labor problems:  no Any problems since the delivery:  no  Newborn Details:  SINGLETON :  1. Baby's name: [name]  Birth weight: 8.9lb Maternal Details:  Breast Feeding:  BOTTLE Post partum depression/anxiety noted:  no Edinburgh Post-Partum Depression Score:  6  Date of last PAP: 11/23/2018-NORMAL  Denies issues with her incision.   Past Medical History:  Diagnosis Date  . Anemia   . Anxiety    no meds  . Complication of anesthesia   . PONV (postoperative nausea and vomiting)     Past Surgical History:  Procedure Laterality Date  . CESAREAN SECTION N/A 02/07/2020   Procedure: CESAREAN SECTION;  Surgeon: Conard Novak, MD;  Location: [facility];  Service: Obstetrics;  Laterality: N/A;  . DILATION AND EVACUATION N/A 12/30/2018   Procedure: DILATATION AND EVACUATION;  Surgeon: Vena Austria, MD;  Location: [facility];  Service: Gynecology;  Laterality: N/A;  . WISDOM TOOTH EXTRACTION      Prior to Admission medications: denies     Allergies  Allergen Reactions  . Abilify [Aripiprazole] Other (See Comments)    Oculogyric crisis --eyes rolled in back of head  . Nuvigil [Armodafinil] Swelling    Tongue and throat swelling     Social History   Socioeconomic History  . Marital status: Married    Spouse name: Harrold Donath  . Number of children: Not on file  . Years of education: Not on file  . Highest education level: Not on file  Occupational History  . Not on file  Tobacco Use  . Smoking status: Never Smoker  . Smokeless tobacco: Never Used  Substance and Sexual Activity  . Alcohol use: Yes  . Drug use: Never  . Sexual activity: Yes    Birth control/protection: None  Other Topics Concern  . Not on file    Social History Narrative  . Not on file   Social Determinants of Health   Financial Resource Strain:   . Difficulty of Paying Living Expenses:   Food Insecurity:   . Worried About Programme researcher, broadcasting/film/video in the Last Year:   . Barista in the Last Year:   Transportation Needs:   . Freight forwarder (Medical):   Marland Kitchen Lack of Transportation (Non-Medical):   Physical Activity:   . Days of Exercise per Week:   . Minutes of Exercise per Session:   Stress:   . Feeling of Stress :   Social Connections:   . Frequency of Communication with Friends and Family:   . Frequency of Social Gatherings with Friends and Family:   . Attends Religious Services:   . Active Member of Clubs or Organizations:   . Attends Banker Meetings:   Marland Kitchen Marital Status:   Intimate Partner Violence:   . Fear of Current or Ex-Partner:   . Emotionally Abused:   Marland Kitchen Physically Abused:   . Sexually Abused:     Family History  Problem Relation Age of Onset  . Hypertension Mother   . Healthy Father     Review of Systems  Constitutional: Negative.   HENT: Negative.   Eyes: Negative.   Respiratory: Negative.   Cardiovascular: Negative.   Gastrointestinal: Negative.   Genitourinary: Negative.   Musculoskeletal:  Negative.   Skin: Negative.   Neurological: Negative.   Psychiatric/Behavioral: Negative.      Physical Exam BP 122/70   Ht 5\' 1"  (1.549 m)   Wt 136 lb (61.7 kg)   LMP 04/30/2019   BMI 25.70 kg/m   Physical Exam Constitutional:      General: She is not in acute distress.    Appearance: Normal appearance. She is well-developed.  Genitourinary:     Pelvic exam was performed with patient in the lithotomy position.     Vulva, inguinal canal, urethra, bladder, vagina, uterus, right adnexa and left adnexa normal.     No posterior fourchette tenderness, injury or lesion present.     No cervical friability, lesion, bleeding or polyp.  HENT:     Head: Normocephalic and  atraumatic.  Eyes:     General: No scleral icterus.    Conjunctiva/sclera: Conjunctivae normal.  Cardiovascular:     Rate and Rhythm: Normal rate and regular rhythm.     Heart sounds: No murmur. No friction rub. No gallop.   Pulmonary:     Effort: Pulmonary effort is normal. No respiratory distress.     Breath sounds: Normal breath sounds. No wheezing or rales.  Abdominal:     General: Bowel sounds are normal. There is no distension.     Palpations: Abdomen is soft. There is no mass.     Tenderness: There is no abdominal tenderness. There is no guarding or rebound.     Comments: without erythema, induration, warmth, and tenderness. It is clean, dry, and intact.    Musculoskeletal:        General: Normal range of motion.     Cervical back: Normal range of motion and neck supple.  Neurological:     General: No focal deficit present.     Mental Status: She is alert and oriented to person, place, and time.     Cranial Nerves: No cranial nerve deficit.  Skin:    General: Skin is warm and dry.     Findings: No erythema.  Psychiatric:        Mood and Affect: Mood normal.        Behavior: Behavior normal.        Judgment: Judgment normal.      Female Chaperone present during breast and/or pelvic exam.  Assessment: 29 y.o. G2P1011 presenting for 6 week postpartum visit  Plan: Problem List Items Addressed This Visit    None    Visit Diagnoses    Postpartum care and examination    -  Primary     1) Contraception Education given regarding options for contraception, including barrier methods.  2)  Pap - ASCCP guidelines and rational discussed.  Patient opts for routine screening interval  3) Patient underwent screening for postpartum depression with no concerns noted.  4) Follow up 1 year for routine annual exam  Prentice Docker, MD 03/23/2020 11:36 AM

## 2020-04-21 ENCOUNTER — Ambulatory Visit: Payer: Self-pay

## 2020-05-15 ENCOUNTER — Encounter: Payer: Self-pay | Admitting: Obstetrics and Gynecology

## 2020-05-15 NOTE — Addendum Note (Signed)
Addendum  created 05/15/20 1320 by Stormy Fabian, CRNA   Intraprocedure Event edited

## 2020-06-01 ENCOUNTER — Ambulatory Visit: Payer: Self-pay | Admitting: Adult Health

## 2020-06-06 ENCOUNTER — Ambulatory Visit: Payer: Self-pay | Admitting: Adult Health

## 2020-06-21 NOTE — Telephone Encounter (Signed)
Patient is schedule for 06/22/20 with [facility] at 3:30

## 2020-06-22 ENCOUNTER — Ambulatory Visit (INDEPENDENT_AMBULATORY_CARE_PROVIDER_SITE_OTHER): Payer: BC Managed Care – PPO | Admitting: Obstetrics and Gynecology

## 2020-06-22 ENCOUNTER — Encounter: Payer: Self-pay | Admitting: Obstetrics and Gynecology

## 2020-06-22 ENCOUNTER — Other Ambulatory Visit: Payer: Self-pay

## 2020-06-22 VITALS — BP 132/66 | Wt 135.0 lb

## 2020-06-22 DIAGNOSIS — F329 Major depressive disorder, single episode, unspecified: Secondary | ICD-10-CM

## 2020-06-22 DIAGNOSIS — F419 Anxiety disorder, unspecified: Secondary | ICD-10-CM | POA: Diagnosis not present

## 2020-06-22 DIAGNOSIS — F4320 Adjustment disorder, unspecified: Secondary | ICD-10-CM | POA: Diagnosis not present

## 2020-06-22 MED ORDER — CITALOPRAM HYDROBROMIDE 20 MG PO TABS: 20.0000 mg | ORAL_TABLET | Freq: Every day | ORAL | 1 refills | Status: DC

## 2020-06-22 NOTE — Patient Instructions (Signed)
[facility] [place], [employer] [place] Same Day Access Hours:  Monday, Wednesday and Friday, 8am - 3pm Walk-In Crisis Hours: 7 days/wk, 8am - 8pm [address], [place], [place] [address] [phone]  [employer] [phone]  Mobile Crisis Unit [phone]   Therapists/Counselors/Psychologists  [facility] [address] [facility] [address] [place], [place] [address] [phone]  Karen [place], [place]  & Jacqlyn Krauss [place]    [phone]        [address]       [place], [place] [address]        Ival Bible, CSW [phone] [address] [place], [place] [address]  Harle Battiest, [place]        [phone]        [address]      [place], [place] [address]        Chyrel Masson, MS [phone] [address] [place], [place] [address]   Oscar La, LMFT       [phone]        [address]       [place], [place] [address]        Felecia Jan [phone] [address] [place], [place] [address]  Lester Sycamore        [phone]        [address]       [place], [place] [address]        Kerin Salen [phone] [address] [place], [place] [address]  Tyron Russell, PsyD       [phone]        [address]       [place], [place] [address]        Elita Quick, LPC [phone] [address]  [place], [place] [address]   Debarah Crape        [phone]        [address] [place], [place] [address]         Rosana Hoes [facility] [phone] [email or web address]   Sation Konchella       [phone]        [address]       [place], [place] [address]        Morton Stall [facility] [phone] [email or web address]   ADD/ADHD Testing:   Lynetta Mare, PhD [address] [place], [place] [address] [phone] [facility] in [place],  [place]   [place], [place] [facility] [address] [place], [place] [address] Phone: [phone] Fax: [phone] [place] , [place], Medicaid   Legrand Rams [address] [place], [place] [address] [phone]    [place] [facility] in [place], [place]   ----------------------------------------------------------   Neuropsych Testing Dr Thora Lance [address] [place], [place]: [phone] F: [phone] [email or web address]    [facility]  Lubertha Basque Theodore Therapist: [phone]     Eating Disorders   [facility]  [email or web address] [phone]  [facility] [phone], [phone]   Substance Abuse Treatment   [facility], [place], [place] [address], [place] [address] [facility] [place], [place], [place], [place] [employer], [place], [place] [facility] [place], [place], [place]   [employer] Care Program:  [email or web address]    Living With Depression Everyone experiences occasional disappointment, sadness, and loss  in their lives. When you are feeling down, blue, or sad for at least 2 weeks in a row, it may mean that you have depression. Depression can affect your thoughts and feelings, relationships, daily activities, and physical health. It is caused by changes in the way your brain functions. If you receive a diagnosis of depression, your health care provider will tell you which type of depression you have and what treatment options are available to you. If you are living with depression, there are ways to help you recover from it and also ways to prevent it from coming back. How to cope with lifestyle changes Coping with stress     Stress is your body's reaction to life changes and events, both good and bad. Stressful situations may  include:  Getting married.  The death of a spouse.  Losing a job.  Retiring.  Having a baby. Stress can last just a few hours or it can be ongoing. Stress can play a major role in depression, so it is important to learn both how to cope with stress and how to think about it differently. Talk with your health care provider or a counselor if you would like to learn more about stress reduction. He or she may suggest some stress reduction techniques, such as:  Music therapy. This can include creating music or listening to music. Choose music that you enjoy and that inspires you.  Mindfulness-based meditation. This kind of meditation can be done while sitting or walking. It involves being aware of your normal breaths, rather than trying to control your breathing.  Centering prayer. This is a kind of meditation that involves focusing on a spiritual word or phrase. Choose a word, phrase, or sacred image that is meaningful to you and that brings you peace.  Deep breathing. To do this, expand your stomach and inhale slowly through your nose. Hold your breath for 3-5 seconds, then exhale slowly, allowing your stomach muscles to relax.  Muscle relaxation. This involves intentionally tensing muscles then relaxing them. Choose a stress reduction technique that fits your lifestyle and personality. Stress reduction techniques take time and practice to develop. Set aside 5-15 minutes a day to do them. Therapists can offer training in these techniques. The training may be covered by some insurance plans. Other things you can do to manage stress include:  Keeping a stress diary. This can help you learn what triggers your stress and ways to control your response.  Understanding what your limits are and saying no to requests or events that lead to a schedule that is too full.  Thinking about how you respond to certain situations. You may not be able to control everything, but you can control how you  react.  Adding humor to your life by watching funny films or TV shows.  Making time for activities that help you relax and not feeling guilty about spending your time this way.  Medicines Your health care provider may suggest certain medicines if he or she feels that they will help improve your condition. Avoid using alcohol and other substances that may prevent your medicines from working properly (may interact). It is also important to:  Talk with your pharmacist or health care provider about all the medicines that you take, their possible side effects, and what medicines are safe to take together.  Make it your goal to take part in all treatment decisions (shared decision-making). This includes giving input on the side effects of medicines. It is best if  shared decision-making with your health care provider is part of your total treatment plan. If your health care provider prescribes a medicine, you may not notice the full benefits of it for 4-8 weeks. Most people who are treated for depression need to be on medicine for at least 6-12 months after they feel better. If you are taking medicines as part of your treatment, do not stop taking medicines without first talking to your health care provider. You may need to have the medicine slowly decreased (tapered) over time to decrease the risk of harmful side effects. Relationships Your health care provider may suggest family therapy along with individual therapy and drug therapy. While there may not be family problems that are causing you to feel depressed, it is still important to make sure your family learns as much as they can about your mental health. Having your family's support can help make your treatment successful. How to recognize changes in your condition Everyone has a different response to treatment for depression. Recovery from major depression happens when you have not had signs of major depression for two months. This may mean that you  will start to:  Have more interest in doing activities.  Feel less hopeless than you did 2 months ago.  Have more energy.  Overeat less often, or have better or improving appetite.  Have better concentration. Your health care provider will work with you to decide the next steps in your recovery. It is also important to recognize when your condition is getting worse. Watch for these signs:  Having fatigue or low energy.  Eating too much or too little.  Sleeping too much or too little.  Feeling restless, agitated, or hopeless.  Having trouble concentrating or making decisions.  Having unexplained physical complaints.  Feeling irritable, angry, or aggressive. Get help as soon as you or your family members notice these symptoms coming back. How to get support and help from others How to talk with friends and family members about your condition  Talking to friends and family members about your condition can provide you with one way to get support and guidance. Reach out to trusted friends or family members, explain your symptoms to them, and let them know that you are working with a health care provider to treat your depression. Financial resources Not all insurance plans cover mental health care, so it is important to check with your insurance carrier. If paying for co-pays or counseling services is a problem, search for a local or county mental health care center. They may be able to offer public mental health care services at low or no cost when you are not able to see a private health care provider. If you are taking medicine for depression, you may be able to get the generic form, which may be less expensive. Some makers of prescription medicines also offer help to patients who cannot afford the medicines they need. Follow these instructions at home:   Get the right amount and quality of sleep.  Cut down on using caffeine, tobacco, alcohol, and other potentially harmful  substances.  Try to exercise, such as walking or lifting small weights.  Take over-the-counter and prescription medicines only as told by your health care provider.  Eat a healthy diet that includes plenty of vegetables, fruits, whole grains, low-fat dairy products, and lean protein. Do not eat a lot of foods that are high in solid fats, added sugars, or salt.  Keep all follow-up visits as told by your  health care provider. This is important. Contact a health care provider if:  You stop taking your antidepressant medicines, and you have any of these symptoms: ? Nausea. ? Headache. ? Feeling lightheaded. ? Chills and body aches. ? Not being able to sleep (insomnia).  You or your friends and family think your depression is getting worse. Get help right away if:  You have thoughts of hurting yourself or others. If you ever feel like you may hurt yourself or others, or have thoughts about taking your own life, get help right away. You can go to your nearest emergency department or call:  Your local emergency services (911 in the [place]  A suicide crisis helpline, such as the National Suicide Prevention Lifeline at (769) 833-78001-631-012-3840. This is open 24-hours a day. Summary  If you are living with depression, there are ways to help you recover from it and also ways to prevent it from coming back.  Work with your health care team to create a management plan that includes counseling, stress management techniques, and healthy lifestyle habits. This information is not intended to replace advice given to you by your health care provider. Make sure you discuss any questions you have with your health care provider. Document Revised: 02/04/2019 Document Reviewed: 09/15/2016 Elsevier Patient Education  2020 Elsevier Inc.   Managing Anxiety, Adult After being diagnosed with an anxiety disorder, you may be relieved to know why you have felt or behaved a certain way. You may also feel overwhelmed  about the treatment ahead and what it will mean for your life. With care and support, you can manage this condition and recover from it. How to manage lifestyle changes Managing stress and anxiety  Stress is your body's reaction to life changes and events, both good and bad. Most stress will last just a few hours, but stress can be ongoing and can lead to more than just stress. Although stress can play a major role in anxiety, it is not the same as anxiety. Stress is usually caused by something external, such as a deadline, test, or competition. Stress normally passes after the triggering event has ended.  Anxiety is caused by something internal, such as imagining a terrible outcome or worrying that something will go wrong that will devastate you. Anxiety often does not go away even after the triggering event is over, and it can become long-term (chronic) worry. It is important to understand the differences between stress and anxiety and to manage your stress effectively so that it does not lead to an anxious response. Talk with your health care provider or a counselor to learn more about reducing anxiety and stress. He or she may suggest tension reduction techniques, such as:  Music therapy. This can include creating or listening to music that you enjoy and that inspires you.  Mindfulness-based meditation. This involves being aware of your normal breaths while not trying to control your breathing. It can be done while sitting or walking.  Centering prayer. This involves focusing on a word, phrase, or sacred image that means something to you and brings you peace.  Deep breathing. To do this, expand your stomach and inhale slowly through your nose. Hold your breath for 3-5 seconds. Then exhale slowly, letting your stomach muscles relax.  Self-talk. This involves identifying thought patterns that lead to anxiety reactions and changing those patterns.  Muscle relaxation. This involves tensing muscles  and then relaxing them. Choose a tension reduction technique that suits your lifestyle and personality. These techniques  take time and practice. Set aside 5-15 minutes a day to do them. Therapists can offer counseling and training in these techniques. The training to help with anxiety may be covered by some insurance plans. Other things you can do to manage stress and anxiety include:  Keeping a stress/anxiety diary. This can help you learn what triggers your reaction and then learn ways to manage your response.  Thinking about how you react to certain situations. You may not be able to control everything, but you can control your response.  Making time for activities that help you relax and not feeling guilty about spending your time in this way.  Visual imagery and yoga can help you stay calm and relax.  Medicines Medicines can help ease symptoms. Medicines for anxiety include:  Anti-anxiety drugs.  Antidepressants. Medicines are often used as a primary treatment for anxiety disorder. Medicines will be prescribed by a health care provider. When used together, medicines, psychotherapy, and tension reduction techniques may be the most effective treatment. Relationships Relationships can play a big part in helping you recover. Try to spend more time connecting with trusted friends and family members. Consider going to couples counseling, taking family education classes, or going to family therapy. Therapy can help you and others better understand your condition. How to recognize changes in your anxiety Everyone responds differently to treatment for anxiety. Recovery from anxiety happens when symptoms decrease and stop interfering with your daily activities at home or work. This may mean that you will start to:  Have better concentration and focus. Worry will interfere less in your daily thinking.  Sleep better.  Be less irritable.  Have more energy.  Have improved memory. It is  important to recognize when your condition is getting worse. Contact your health care provider if your symptoms interfere with home or work and you feel like your condition is not improving. Follow these instructions at home: Activity  Exercise. Most adults should do the following: ? Exercise for at least 150 minutes each week. The exercise should increase your heart rate and make you sweat (moderate-intensity exercise). ? Strengthening exercises at least twice a week.  Get the right amount and quality of sleep. Most adults need 7-9 hours of sleep each night. Lifestyle   Eat a healthy diet that includes plenty of vegetables, fruits, whole grains, low-fat dairy products, and lean protein. Do not eat a lot of foods that are high in solid fats, added sugars, or salt.  Make choices that simplify your life.  Do not use any products that contain nicotine or tobacco, such as cigarettes, e-cigarettes, and chewing tobacco. If you need help quitting, ask your health care provider.  Avoid caffeine, alcohol, and certain over-the-counter cold medicines. These may make you feel worse. Ask your pharmacist which medicines to avoid. General instructions  Take over-the-counter and prescription medicines only as told by your health care provider.  Keep all follow-up visits as told by your health care provider. This is important. Where to find support You can get help and support from these sources:  Self-help groups.  Online and [employer].  A trusted spiritual leader.  Couples counseling.  Family education classes.  Family therapy. Where to find more information You may find that joining a support group helps you deal with your anxiety. The following sources can help you locate counselors or support groups near you:  Mental Health America: www.mentalhealthamerica.net  Anxiety and Depression Association of [place] (ADAA): ProgramCam.de  [employer] on Mental Illness  (  NAMI): www.nami.org Contact a health care provider if you:  Have a hard time staying focused or finishing daily tasks.  Spend many hours a day feeling worried about everyday life.  Become exhausted by worry.  Start to have headaches, feel tense, or have nausea.  Urinate more than normal.  Have diarrhea. Get help right away if you have:  A racing heart and shortness of breath.  Thoughts of hurting yourself or others. If you ever feel like you may hurt yourself or others, or have thoughts about taking your own life, get help right away. You can go to your nearest emergency department or call:  Your local emergency services (911 in the [place]  A suicide crisis helpline, such as the National Suicide Prevention Lifeline at (305) 445-5143. This is open 24 hours a day. Summary  Taking steps to learn and use tension reduction techniques can help calm you and help prevent triggering an anxiety reaction.  When used together, medicines, psychotherapy, and tension reduction techniques may be the most effective treatment.  Family, friends, and partners can play a big part in helping you recover from an anxiety disorder. This information is not intended to replace advice given to you by your health care provider. Make sure you discuss any questions you have with your health care provider. Document Revised: 03/15/2019 Document Reviewed: 03/15/2019 Elsevier Patient Education  2020 [employer].

## 2020-06-22 NOTE — Progress Notes (Signed)
Patient ID: Natalie Welch, female   DOB: May 16, 1991, 29 y.o.   MRN: [id number]  Reason for Consult: Postpartum depression   Referred by No ref. provider found  Subjective:     HPI:  Natalie Welch is a 29 y.o. female patient presents today with concerns regarding depression anxiety.  Patient delivered in April.  Patient reports that over the last year she has had significant stressors.  She reports that she has lost both her grandmother and her great-grandmother.  She reports that she is very very close with her grandmother and this was a difficult loss.  She also reports that her husband has been diagnosed with a rare esophageal condition that makes it difficult for him to swallow without feeling like he is going to choke.  She reports that it took a long time for him to receive a diagnosis and in the course of this disease process he has lost over 30 pounds.  She reports that he is now receiving care through [facility] and has been diagnosed with esophageal eosinophilia.  She reports that he can eat small amounts such as mashed potatoes but he has significant anxiety regarding any ingestion of food.  This is likely a chronic condition for him that will be managed by diet.  He has not yet needed a gastric tube.She has had additional stress this year from her husband contracting Covid shortly after her delivery as well as the death of her beloved dog.  She also reports that she has been working time and a half.  Over 50 hours of work a week.  She reports that she works from home and often starts work at [employer].  Her overtime is mandated.  She works in Community education officer.  2 of the work days of the week her baby is at home with her the other days her child goes to in-home childcare.  [name] reports that she has a significant psych history.  She is taken medications in the past such as Abilify Wellbutrin and lithium.  She also went for therapy for a long time.  She reports that therapy helped her significantly  although it has been several years since she last spoke with a therapist.  Patient reports that she has good social support.  She has been becoming closer with her mother.  Her mother encourages to use essential oils to help with her depression and anxiety.  Although the essential oils do not completely resolve her depression or anxiety.   Past Medical History:  Diagnosis Date  . Anemia   . Anxiety    no meds  . Complication of anesthesia   . PONV (postoperative nausea and vomiting)    Family History  Problem Relation Age of Onset  . Hypertension Mother   . Healthy Father    Past Surgical History:  Procedure Laterality Date  . CESAREAN SECTION N/A 02/07/2020   Procedure: CESAREAN SECTION;  Surgeon: Conard Novak, MD;  Location: [facility] ORS;  Service: Obstetrics;  Laterality: N/A;  . DILATION AND EVACUATION N/A 12/30/2018   Procedure: DILATATION AND EVACUATION;  Surgeon: Vena Austria, MD;  Location: [facility] ORS;  Service: Gynecology;  Laterality: N/A;  . WISDOM TOOTH EXTRACTION      Short Social History:  Social History   Tobacco Use  . Smoking status: Never Smoker  . Smokeless tobacco: Never Used  Substance Use Topics  . Alcohol use: Yes    Allergies  Allergen Reactions  . Abilify [Aripiprazole] Other (See Comments)  Oculogyric crisis --eyes rolled in back of head  . Nuvigil [Armodafinil] Swelling    Tongue and throat swelling    Current Outpatient Medications  Medication Sig Dispense Refill  . citalopram (CELEXA) 20 MG tablet Take 1 tablet (20 mg total) by mouth daily. 30 tablet 1  . hydrOXYzine (ATARAX/VISTARIL) 25 MG tablet Take 1 tablet (25 mg total) by mouth every 6 (six) hours as needed for anxiety. (Patient not taking: Reported on 03/23/2020) 30 tablet 2   No current facility-administered medications for this visit.    Review of Systems  Constitutional: Negative for chills, fatigue, fever and unexpected weight change.  HENT: Negative for trouble  swallowing.  Eyes: Negative for loss of vision.  Respiratory: Negative for cough, shortness of breath and wheezing.  Cardiovascular: Negative for chest pain, leg swelling, palpitations and syncope.  GI: Negative for abdominal pain, blood in stool, diarrhea, nausea and vomiting.  GU: Negative for difficulty urinating, dysuria, frequency and hematuria.  Musculoskeletal: Negative for back pain, leg pain and joint pain.  Skin: Negative for rash.  Neurological: Negative for dizziness, headaches, light-headedness, numbness and seizures.  Psychiatric: Positive for depressed mood. Negative for behavioral problem, confusion and sleep disturbance.       +anxiety       Objective:  Objective   Vitals:   06/22/20 1536  BP: 132/66  Weight: 135 lb (61.2 kg)   Body mass index is 25.51 kg/m.  Physical Exam Vitals and nursing note reviewed.  Constitutional:      Appearance: She is well-developed.  HENT:     Head: Normocephalic and atraumatic.  Eyes:     Pupils: Pupils are equal, round, and reactive to light.  Cardiovascular:     Rate and Rhythm: Normal rate and regular rhythm.  Pulmonary:     Effort: Pulmonary effort is normal. No respiratory distress.  Skin:    General: Skin is warm and dry.  Neurological:     Mental Status: She is alert and oriented to person, place, and time.  Psychiatric:        Behavior: Behavior normal.        Thought Content: Thought content normal.        Judgment: Judgment normal.         Assessment/Plan:    29 yo Depression and anxiety related to situational stress Encouraged patient to seek therapy. Provided with list of local resources.  Given elevated GAD and PHQ recommended starting SSRI.  Patient has been on a variety of medications in the past.  If SSRI is not adequately addressing depression/anxiety will consider psychiatry referral. Patient declines at this time.  Encouraged self care and exercise.  Discussed option for Zulresso infusion.    More than 25 minutes were spent face to face with the patient in the room, reviewing the medical record, labs and images, and coordinating care for the patient. The plan of management was discussed in detail and counseling was provided.       Adelene Idler MD, [facility], [facility] 06/22/2020 4:34 PM

## 2020-06-25 ENCOUNTER — Telehealth: Payer: Self-pay

## 2020-06-25 NOTE — Telephone Encounter (Signed)
Pt states she has been on the anxiety medication CS RXed last week, and this morning she woke up feeling "high and slurring her words. Wondering if she needs to be on a lower dose or different medication. Please advise pt.

## 2020-07-11 ENCOUNTER — Ambulatory Visit: Payer: Self-pay | Admitting: Adult Health

## 2020-07-18 ENCOUNTER — Other Ambulatory Visit: Payer: Self-pay | Admitting: Obstetrics and Gynecology

## 2020-07-18 DIAGNOSIS — F419 Anxiety disorder, unspecified: Secondary | ICD-10-CM

## 2020-07-20 ENCOUNTER — Encounter: Payer: Self-pay | Admitting: Obstetrics and Gynecology

## 2020-07-20 ENCOUNTER — Other Ambulatory Visit: Payer: Self-pay

## 2020-07-20 ENCOUNTER — Ambulatory Visit (INDEPENDENT_AMBULATORY_CARE_PROVIDER_SITE_OTHER): Payer: BC Managed Care – PPO | Admitting: Obstetrics and Gynecology

## 2020-07-20 VITALS — Wt 132.0 lb

## 2020-07-20 DIAGNOSIS — F419 Anxiety disorder, unspecified: Secondary | ICD-10-CM

## 2020-07-20 DIAGNOSIS — F329 Major depressive disorder, single episode, unspecified: Secondary | ICD-10-CM

## 2020-07-20 MED ORDER — CITALOPRAM HYDROBROMIDE 20 MG PO TABS: 20.0000 mg | ORAL_TABLET | Freq: Every day | ORAL | 11 refills | Status: DC

## 2020-07-20 NOTE — Progress Notes (Signed)
Virtual Visit via Telephone Note  I connected with Natalie Welch on 07/20/20 at 10:50 AM EDT by telephone and verified that I am speaking with the correct person using two identifiers.   I discussed the limitations, risks, security and privacy concerns of performing an evaluation and management service by telephone and the availability of in person appointments. I also discussed with the patient that there may be a patient responsible charge related to this service. The patient expressed understanding and agreed to proceed.  The patient was at home I spoke with the patient from my office. The names of people involved in this encounter were: Marykay and Dr. Jerene Pitch.   History of Present Illness: Leighton has been really happy on the Celexa. She reports that she had initial side effects with the medication so she reduced to half a pill, and after a few weeks started the full pill. She has noticed a significant improvement in her anxiety on the medication.  She would like to continue the medication at this time.   Observations/Objective:  Physical Exam could not be performed. Because of the COVID-19 outbreak this visit was performed over the phone and not in person.   Assessment and Plan: 29 yo with anxiety and depression Patient noticing an improvement. Continue with Celexa 20 mg once a day.   Follow Up Instructions: As needed or in 1 year.    I discussed the assessment and treatment plan with the patient. The patient was provided an opportunity to ask questions and all were answered. The patient agreed with the plan and demonstrated an understanding of the instructions.   The patient was advised to call back or seek an in-person evaluation if the symptoms worsen or if the condition fails to improve as anticipated.  I provided 10 minutes of non-face-to-face time during this encounter.  Adelene Idler MD [facility], [facility] 07/20/2020 10:41 AM

## 2020-08-02 NOTE — Telephone Encounter (Signed)
See refill

## 2020-08-15 ENCOUNTER — Ambulatory Visit (INDEPENDENT_AMBULATORY_CARE_PROVIDER_SITE_OTHER): Payer: BC Managed Care – PPO | Admitting: Adult Health

## 2020-08-15 ENCOUNTER — Encounter: Payer: Self-pay | Admitting: Adult Health

## 2020-08-15 ENCOUNTER — Other Ambulatory Visit: Payer: Self-pay

## 2020-08-15 VITALS — BP 102/67 | HR 72 | Temp 98.6°F | Resp 16 | Ht 61.0 in | Wt 137.8 lb

## 2020-08-15 DIAGNOSIS — F410 Panic disorder [episodic paroxysmal anxiety] without agoraphobia: Secondary | ICD-10-CM | POA: Diagnosis not present

## 2020-08-15 DIAGNOSIS — Z1389 Encounter for screening for other disorder: Secondary | ICD-10-CM

## 2020-08-15 DIAGNOSIS — F411 Generalized anxiety disorder: Secondary | ICD-10-CM | POA: Diagnosis not present

## 2020-08-15 DIAGNOSIS — Z0000 Encounter for general adult medical examination without abnormal findings: Secondary | ICD-10-CM

## 2020-08-15 DIAGNOSIS — E785 Hyperlipidemia, unspecified: Secondary | ICD-10-CM | POA: Diagnosis not present

## 2020-08-15 DIAGNOSIS — E559 Vitamin D deficiency, unspecified: Secondary | ICD-10-CM | POA: Diagnosis not present

## 2020-08-15 LAB — POCT URINALYSIS DIPSTICK
Bilirubin, UA: NEGATIVE
Blood, UA: NEGATIVE
Glucose, UA: NEGATIVE
Ketones, UA: NEGATIVE
Leukocytes, UA: NEGATIVE
Nitrite, UA: NEGATIVE
Protein, UA: NEGATIVE
Spec Grav, UA: 1.025 (ref 1.010–1.025)
Urobilinogen, UA: 0.2 E.U./dL
pH, UA: 5 (ref 5.0–8.0)

## 2020-08-15 NOTE — Progress Notes (Signed)
New patient visit   Patient: Natalie Welch   DOB: 02/02/1991   29 y.o. Female  MRN: [id number] Visit Date: 08/15/2020  Today's healthcare provider: Jairo Ben, FNP   Chief Complaint  Patient presents with  . New Patient (Initial Visit)   Subjective     HPI  LEROY TRIM is a 29 y.o. female who presents today as a new patient to establish care. Patient comes to our office from Dr. Colin Rhein, patient states that he feels well today and has no issues to address. Patient reports that she had a baby 6 months ago and is following with her OB to address symptoms of anxiety and postpartum, that she states is controlled on medication. Patient reports that she follows a well balanced diet, she is staying active and reports that sleep patterns are fairly well.    Always has  had anxiety, had a lot of stressors with pregnancy - 2 deaths. She says she feels the best she has in years. Denies any suicidal thoughts ot intents.   Celexa 20 mg QD has been taking for 1.5 months. She reports she feels good.   Not breastfeeding.   OBGYN Dr. Armando Reichert.   [id number] and c section April 13th. She has history anemia. She had two transfusions after c- section. Not taking any iron now.    Patient  denies any fever, body aches,chills, rash, chest pain, shortness of breath, nausea, vomiting, or diarrhea.  Denies dizziness, lightheadedness, pre syncopal or syncopal episodes.    Past Medical History:  Diagnosis Date  . Allergy   . Anemia   . Anxiety    no meds  . Complication of anesthesia   . Depression   . PONV (postoperative nausea and vomiting)    Past Surgical History:  Procedure Laterality Date  . CESAREAN SECTION N/A 02/07/2020   Procedure: CESAREAN SECTION;  Surgeon: Conard Novak, MD;  Location: [facility] ORS;  Service: Obstetrics;  Laterality: N/A;  . DILATION AND EVACUATION N/A 12/30/2018   Procedure: DILATATION AND EVACUATION;  Surgeon: Vena Austria, MD;   Location: [facility] ORS;  Service: Gynecology;  Laterality: N/A;  . WISDOM TOOTH EXTRACTION     Family Status  Relation Name Status  . Mother  Alive  . Father  Alive   Family History  Problem Relation Age of Onset  . Hypertension Mother   . Healthy Father    Social History   Socioeconomic History  . Marital status: Married    Spouse name: Harrold Donath  . Number of children: Not on file  . Years of education: Not on file  . Highest education level: Not on file  Occupational History  . Not on file  Tobacco Use  . Smoking status: Never Smoker  . Smokeless tobacco: Never Used  Vaping Use  . Vaping Use: Never used  Substance and Sexual Activity  . Alcohol use: Yes    Alcohol/week: 7.0 standard drinks    Types: 1 Cans of beer, 6 Shots of liquor per week  . Drug use: Never  . Sexual activity: Yes    Birth control/protection: None  Other Topics Concern  . Not on file  Social History Narrative  . Not on file   Social Determinants of Health   Financial Resource Strain:   . Difficulty of Paying Living Expenses: Not on file  Food Insecurity:   . Worried About Programme researcher, broadcasting/film/video in the Last Year: Not on file  . Ran Out  of Food in the Last Year: Not on file  Transportation Needs:   . Lack of Transportation (Medical): Not on file  . Lack of Transportation (Non-Medical): Not on file  Physical Activity:   . Days of Exercise per Week: Not on file  . Minutes of Exercise per Session: Not on file  Stress:   . Feeling of Stress : Not on file  Social Connections:   . Frequency of Communication with Friends and Family: Not on file  . Frequency of Social Gatherings with Friends and Family: Not on file  . Attends Religious Services: Not on file  . Active Member of Clubs or Organizations: Not on file  . Attends BankerClub or Organization Meetings: Not on file  . Marital Status: Not on file   Outpatient Medications Prior to Visit  Medication Sig  . citalopram (CELEXA) 20 MG tablet Take 1 tablet  (20 mg total) by mouth daily.  . [DISCONTINUED] citalopram (CELEXA) 20 MG tablet TAKE 1 TABLET BY MOUTH EVERY DAY   No facility-administered medications prior to visit.   Allergies  Allergen Reactions  . Abilify [Aripiprazole] Other (See Comments)    Oculogyric crisis --eyes rolled in back of head  . Nuvigil [Armodafinil] Swelling    Tongue and throat swelling    Immunization History  Administered Date(s) Administered  . Tdap 12/20/2019    Health Maintenance  Topic Date Due  . COVID-19 Vaccine (1) 08/31/2020 (Originally 06/10/2004)  . INFLUENZA VACCINE  01/24/2021 (Originally 05/27/2020)  . Hepatitis C Screening  08/15/2021 (Originally 28-Sep-1991)  . PAP-Cervical Cytology Screening  11/23/2021  . PAP SMEAR-Modifier  11/23/2021  . TETANUS/TDAP  12/19/2029  . HIV Screening  Completed    Patient Care Team: Lavona Norsworthy, Eula FriedMichelle S, FNP as PCP - General (Family Medicine)  Review of Systems  Constitutional: Negative.   HENT: Negative.   Respiratory: Negative.   Cardiovascular: Negative.   Gastrointestinal: Negative.   Genitourinary: Negative.   Musculoskeletal: Negative.   Skin: Negative.        c section scar healing well she reports no issues since last check at OBGYN using manuka honey on scar    Neurological: Negative.   Hematological: Negative.   Psychiatric/Behavioral: Negative for agitation, behavioral problems, confusion, decreased concentration, dysphoric mood, hallucinations, self-injury and suicidal ideas. The patient is nervous/anxious. The patient is not hyperactive.     Last CBC Lab Results  Component Value Date   WBC 11.9 (H) 02/09/2020   HGB 8.4 (L) 02/09/2020   HCT 24.8 (L) 02/09/2020   MCV 86.1 02/09/2020   MCH 29.2 02/09/2020   RDW 14.6 02/09/2020   PLT 191 02/09/2020   Last metabolic panel Lab Results  Component Value Date   GLUCOSE 82 02/06/2020   NA 136 02/06/2020   K 3.4 (L) 02/06/2020   CL 107 02/06/2020   CO2 21 (L) 02/06/2020   BUN 7  02/06/2020   CREATININE 0.55 02/06/2020   GFRNONAA >60 02/06/2020   GFRAA >60 02/06/2020   CALCIUM 8.7 (L) 02/06/2020   PROT 6.2 (L) 02/06/2020   ALBUMIN 2.9 (L) 02/06/2020   BILITOT 0.3 02/06/2020   ALKPHOS 120 02/06/2020   AST 23 02/06/2020   ALT 13 02/06/2020   ANIONGAP 8 02/06/2020   Last lipids No results found for: CHOL, HDL, LDLCALC, LDLDIRECT, TRIG, CHOLHDL Last hemoglobin A1c No results found for: HGBA1C Last thyroid functions Lab Results  Component Value Date   TSH 2.03 11/15/2012   Last vitamin D No results found for: 25OHVITD2,  25OHVITD3, VD25OH Last vitamin B12 and Folate No results found for: VITAMINB12, FOLATE    Objective    BP 102/67   Pulse 72   Temp 98.6 F (37 C) (Oral)   Resp 16   Ht  (1.549 m)   Wt 137 lb 12.8 oz (62.5 kg)   LMP 08/07/2020 (Exact Date)   SpO2 100%   BMI 26.04 kg/m  Physical Exam Vitals reviewed.  Constitutional:      General: She is not in acute distress.    Appearance: Normal appearance. She is well-developed. She is not ill-appearing or diaphoretic.     Interventions: She is not intubated.    Comments: Patient appers well, not sickly. Speaking in complete sentences. Patient moves on and off of exam table and in room without difficulty. Gait is normal in hall and in room. Patient is oriented to person place time and situation. Patient answers questions appropriately and engages eye contact and verbal dialect with provider.    HENT:     Head: Normocephalic and atraumatic.     Right Ear: External ear normal.     Left Ear: External ear normal.     Nose: Nose normal.     Mouth/Throat:     Pharynx: No oropharyngeal exudate.  Eyes:     General: Lids are normal. No scleral icterus.       Right eye: No discharge.        Left eye: No discharge.     Conjunctiva/sclera: Conjunctivae normal.     Right eye: Right conjunctiva is not injected. No exudate or hemorrhage.    Left eye: Left conjunctiva is not injected. No exudate  or hemorrhage.    Pupils: Pupils are equal, round, and reactive to light.  Neck:     Thyroid: No thyroid mass or thyromegaly.     Vascular: Normal carotid pulses. No carotid bruit, hepatojugular reflux or JVD.     Trachea: Trachea and phonation normal. No tracheal tenderness or tracheal deviation.     Meningeal: Brudzinski's sign and Kernig's sign absent.  Cardiovascular:     Rate and Rhythm: Normal rate and regular rhythm.     Pulses: Normal pulses.          Radial pulses are 2+ on the right side and 2+ on the left side.       Dorsalis pedis pulses are 2+ on the right side and 2+ on the left side.       Posterior tibial pulses are 2+ on the right side and 2+ on the left side.     Heart sounds: Normal heart sounds, S1 normal and S2 normal. Heart sounds not distant. No murmur heard.  No friction rub. No gallop.   Pulmonary:     Effort: Pulmonary effort is normal. No tachypnea, bradypnea, accessory muscle usage or respiratory distress. She is not intubated.     Breath sounds: Normal breath sounds. No stridor. No wheezing or rales.  Chest:     Chest wall: No tenderness.  Abdominal:     General: Bowel sounds are normal. There is no distension or abdominal bruit.     Palpations: Abdomen is soft. There is no shifting dullness, fluid wave, hepatomegaly, splenomegaly, mass or pulsatile mass.     Tenderness: There is no abdominal tenderness. There is no right CVA tenderness, left CVA tenderness, guarding or rebound.     Hernia: No hernia is present.  Musculoskeletal:        General: No tenderness or  deformity. Normal range of motion.     Cervical back: Full passive range of motion without pain, normal range of motion and neck supple. No edema, erythema or rigidity. No spinous process tenderness or muscular tenderness. Normal range of motion.  Lymphadenopathy:     Head:     Right side of head: No submental, submandibular, tonsillar, preauricular, posterior auricular or occipital adenopathy.      Left side of head: No submental, submandibular, tonsillar, preauricular, posterior auricular or occipital adenopathy.     Cervical: No cervical adenopathy.     Right cervical: No superficial, deep or posterior cervical adenopathy.    Left cervical: No superficial, deep or posterior cervical adenopathy.     Upper Body:     Right upper body: No supraclavicular or pectoral adenopathy.     Left upper body: No supraclavicular or pectoral adenopathy.  Skin:    General: Skin is warm and dry.     Coloration: Skin is not pale.     Findings: No abrasion, bruising, burn, ecchymosis, erythema, lesion, petechiae or rash.     Nails: There is no clubbing.     Comments: Skin revealed by clothing is within normal - patient declined any skin concerns.   Neurological:     Mental Status: She is alert and oriented to person, place, and time.     GCS: GCS eye subscore is 4. GCS verbal subscore is 5. GCS motor subscore is 6.     Cranial Nerves: No cranial nerve deficit.     Sensory: No sensory deficit.     Motor: No tremor, atrophy, abnormal muscle tone or seizure activity.     Coordination: Coordination normal.     Gait: Gait normal.     Deep Tendon Reflexes: Reflexes are normal and symmetric. Reflexes normal. Babinski sign absent on the right side. Babinski sign absent on the left side.     Reflex Scores:      Tricep reflexes are 2+ on the right side and 2+ on the left side.      Bicep reflexes are 2+ on the right side and 2+ on the left side.      Brachioradialis reflexes are 2+ on the right side and 2+ on the left side.      Patellar reflexes are 2+ on the right side and 2+ on the left side.      Achilles reflexes are 2+ on the right side and 2+ on the left side. Psychiatric:        Speech: Speech normal.        Behavior: Behavior normal.        Thought Content: Thought content normal.        Judgment: Judgment normal.    Depression Screen PHQ 2/9 Scores 08/15/2020 08/15/2020 07/20/2020 06/22/2020   PHQ - 2 Score 1 1 1 5   PHQ- 9 Score 10 - 10 14   Results for orders placed or performed in visit on 08/15/20  POCT urinalysis dipstick  Result Value Ref Range   Color, UA yellow    Clarity, UA clear    Glucose, UA Negative Negative   Bilirubin, UA negative    Ketones, UA negative    Spec Grav, UA 1.025 1.010 - 1.025   Blood, UA negative    pH, UA 5.0 5.0 - 8.0   Protein, UA Negative Negative   Urobilinogen, UA 0.2 0.2 or 1.0 E.U./dL   Nitrite, UA negative    Leukocytes, UA Negative Negative  Appearance     Odor      Assessment & Plan      Routine health maintenance  Screening for blood or protein in urine - Plan: POCT urinalysis dipstick  Vitamin D deficiency - Plan: VITAMIN D 25 Hydroxy (Vit-D Deficiency, Fractures)  Generalized anxiety disorder with panic attacks - Plan: CBC with Differential/Platelet, Comprehensive metabolic panel, Lipid panel, TSH   No orders of the defined types were placed in this encounter.   Orders Placed This Encounter  Procedures  . CBC with Differential/Platelet  . Comprehensive metabolic panel  . Lipid panel  . TSH  . VITAMIN D 25 Hydroxy (Vit-D Deficiency, Fractures)  . POCT urinalysis dipstick   Healthy lifestyle discussed.  The patient is advised to follow a low fat, low cholesterol diet, reduce exposure to stress, continue current medications, continue current healthy lifestyle patterns and return for routine annual checkups.  Return if symptoms worsen or fail to improve, for at any time for any worsening symptoms, Go to Emergency room/ urgent care if worse.       Jairo Ben, FNP  [facility] [phone] (phone) [phone] (fax)  [facility]

## 2020-08-15 NOTE — Patient Instructions (Addendum)
The patient is advised to reduce exposure to stress, continue current medications, continue current healthy lifestyle patterns and return for routine annual checkups.   Health Maintenance, Female Adopting a healthy lifestyle and getting preventive care are important in promoting health and wellness. Ask your health care provider about:  The right schedule for you to have regular tests and exams.  Things you can do on your own to prevent diseases and keep yourself healthy. What should I know about diet, weight, and exercise? Eat a healthy diet   Eat a diet that includes plenty of vegetables, fruits, low-fat dairy products, and lean protein.  Do not eat a lot of foods that are high in solid fats, added sugars, or sodium. Maintain a healthy weight Body mass index (BMI) is used to identify weight problems. It estimates body fat based on height and weight. Your health care provider can help determine your BMI and help you achieve or maintain a healthy weight. Get regular exercise Get regular exercise. This is one of the most important things you can do for your health. Most adults should:  Exercise for at least 150 minutes each week. The exercise should increase your heart rate and make you sweat (moderate-intensity exercise).  Do strengthening exercises at least twice a week. This is in addition to the moderate-intensity exercise.  Spend less time sitting. Even light physical activity can be beneficial. Watch cholesterol and blood lipids Have your blood tested for lipids and cholesterol at 28 years of age, then have this test every 5 years. Have your cholesterol levels checked more often if:  Your lipid or cholesterol levels are high.  You are older than 28 years of age.  You are at high risk for heart disease. What should I know about cancer screening? Depending on your health history and family history, you may need to have cancer screening at various ages. This may include  screening for:  Breast cancer.  Cervical cancer.  Colorectal cancer.  Skin cancer.  Lung cancer. What should I know about heart disease, diabetes, and high blood pressure? Blood pressure and heart disease  High blood pressure causes heart disease and increases the risk of stroke. This is more likely to develop in people who have high blood pressure readings, are of African descent, or are overweight.  Have your blood pressure checked: ? Every 3-5 years if you are 7-6 years of age. ? Every year if you are 23 years old or older. Diabetes Have regular diabetes screenings. This checks your fasting blood sugar level. Have the screening done:  Once every three years after age 80 if you are at a normal weight and have a low risk for diabetes.  More often and at a younger age if you are overweight or have a high risk for diabetes. What should I know about preventing infection? Hepatitis B If you have a higher risk for hepatitis B, you should be screened for this virus. Talk with your health care provider to find out if you are at risk for hepatitis B infection. Hepatitis C Testing is recommended for:  Everyone born from [birth year] through 1964.  Anyone with known risk factors for hepatitis C. Sexually transmitted infections (STIs)  Get screened for STIs, including gonorrhea and chlamydia, if: ? You are sexually active and are younger than 28 years of age. ? You are older than 28 years of age and your health care provider tells you that you are at risk for this type of infection. ? Your  sexual activity has changed since you were last screened, and you are at increased risk for chlamydia or gonorrhea. Ask your health care provider if you are at risk.  Ask your health care provider about whether you are at high risk for HIV. Your health care provider may recommend a prescription medicine to help prevent HIV infection. If you choose to take medicine to prevent HIV, you should first get  tested for HIV. You should then be tested every 3 months for as long as you are taking the medicine. Pregnancy  If you are about to stop having your period (premenopausal) and you may become pregnant, seek counseling before you get pregnant.  Take 400 to 800 micrograms (mcg) of folic acid every day if you become pregnant.  Ask for birth control (contraception) if you want to prevent pregnancy. Osteoporosis and menopause Osteoporosis is a disease in which the bones lose minerals and strength with aging. This can result in bone fractures. If you are 85 years old or older, or if you are at risk for osteoporosis and fractures, ask your health care provider if you should:  Be screened for bone loss.  Take a calcium or vitamin D supplement to lower your risk of fractures.  Be given hormone replacement therapy (HRT) to treat symptoms of menopause. Follow these instructions at home: Lifestyle  Do not use any products that contain nicotine or tobacco, such as cigarettes, e-cigarettes, and chewing tobacco. If you need help quitting, ask your health care provider.  Do not use street drugs.  Do not share needles.  Ask your health care provider for help if you need support or information about quitting drugs. Alcohol use  Do not drink alcohol if: ? Your health care provider tells you not to drink. ? You are pregnant, may be pregnant, or are planning to become pregnant.  If you drink alcohol: ? Limit how much you use to 0-1 drink a day. ? Limit intake if you are breastfeeding.  Be aware of how much alcohol is in your drink. In the [place] one drink equals one 12 oz bottle of beer (355 mL), one 5 oz glass of wine (148 mL), or one 1 oz glass of hard liquor (44 mL). General instructions  Schedule regular health, dental, and eye exams.  Stay current with your vaccines.  Tell your health care provider if: ? You often feel depressed. ? You have ever been abused or do not feel safe at  home. Summary  Adopting a healthy lifestyle and getting preventive care are important in promoting health and wellness.  Follow your health care provider's instructions about healthy diet, exercising, and getting tested or screened for diseases.  Follow your health care provider's instructions on monitoring your cholesterol and blood pressure. This information is not intended to replace advice given to you by your health care provider. Make sure you discuss any questions you have with your health care provider. Document Revised: 10/06/2018 Document Reviewed: 10/06/2018 Elsevier Patient Education  2020 [employer]  Vitamin D Deficiency Vitamin D deficiency is when your body does not have enough vitamin D. Vitamin D is important to your body because:  It helps your body use other minerals.  It helps to keep your bones strong and healthy.  It may help to prevent some diseases.  It helps your heart and other muscles work well. Not getting enough vitamin D can make your bones soft. It can also cause other health problems. What are the causes? This condition  may be caused by:  Not eating enough foods that contain vitamin D.  Not getting enough sun.  Having diseases that make it hard for your body to absorb vitamin D.  Having a surgery in which a part of the stomach or a part of the small intestine is removed.  Having kidney disease or liver disease. What increases the risk? You are more likely to get this condition if:  You are older.  You do not spend much time outdoors.  You live in a nursing home.  You have had broken bones.  You have weak or thin bones (osteoporosis).  You have a disease or condition that changes how your body absorbs vitamin D.  You have dark skin.  You take certain medicines.  You are overweight or obese. What are the signs or symptoms?  In mild cases, there may not be any symptoms. If the condition is very bad, symptoms may  include: ? Bone pain. ? Muscle pain. ? Falling often. ? Broken bones caused by a minor injury. How is this treated? Treatment may include taking supplements as told by your doctor. Your doctor will tell you what dose is best for you. Supplements may include:  Vitamin D.  Calcium. Follow these instructions at home: Eating and drinking   Eat foods that contain vitamin D, such as: ? Dairy products, cereals, or juices with added vitamin D. Check the label. ? Fish, such as salmon or trout. ? Eggs. ? Oysters. ? Mushrooms. The items listed above may not be a complete list of what you can eat and drink. Contact a dietitian for more options. General instructions  Take medicines and supplements only as told by your doctor.  Get regular, safe exposure to natural sunlight.  Do not use a tanning bed.  Maintain a healthy weight. Lose weight if needed.  Keep all follow-up visits as told by your doctor. This is important. How is this prevented?  You can get vitamin D by: ? Eating foods that naturally contain vitamin D. ? Eating or drinking products that have vitamin D added to them, such as cereals, juices, and milk. ? Taking vitamin D or a multivitamin that contains vitamin D. ? Being in the sun. Your body makes vitamin D when your skin is exposed to sunlight. Your body changes the sunlight into a form of the vitamin that it can use. Contact a doctor if:  Your symptoms do not go away.  You feel sick to your stomach (nauseous).  You throw up (vomit).  You poop less often than normal, or you have trouble pooping (constipation). Summary  Vitamin D deficiency is when your body does not have enough vitamin D.  Vitamin D helps to keep your bones strong and healthy.  This condition is often treated by taking a supplement.  Your doctor will tell you what dose is best for you. This information is not intended to replace advice given to you by your health care provider. Make sure you  discuss any questions you have with your health care provider. Document Revised: 06/21/2018 Document Reviewed: 06/21/2018 Elsevier Patient Education  2020 [employer].

## 2020-08-16 LAB — COMPREHENSIVE METABOLIC PANEL
ALT: 14 IU/L (ref 0–32)
AST: 16 IU/L (ref 0–40)
Albumin/Globulin Ratio: 1.6 (ref 1.2–2.2)
Albumin: 4.6 g/dL (ref 3.9–5.0)
Alkaline Phosphatase: 86 IU/L (ref 44–121)
BUN/Creatinine Ratio: 13 (ref 9–23)
BUN: 10 mg/dL (ref 6–20)
Bilirubin Total: 0.3 mg/dL (ref 0.0–1.2)
CO2: 23 mmol/L (ref 20–29)
Calcium: 9.6 mg/dL (ref 8.7–10.2)
Chloride: 103 mmol/L (ref 96–106)
Creatinine, Ser: 0.8 mg/dL (ref 0.57–1.00)
GFR calc Af Amer: 116 mL/min/{1.73_m2} (ref >59)
GFR calc non Af Amer: 101 mL/min/{1.73_m2} (ref >59)
Globulin, Total: 2.9 g/dL (ref 1.5–4.5)
Glucose: 84 mg/dL (ref 65–99)
Potassium: 4.9 mmol/L (ref 3.5–5.2)
Sodium: 138 mmol/L (ref 134–144)
Total Protein: 7.5 g/dL (ref 6.0–8.5)

## 2020-08-16 LAB — CBC WITH DIFFERENTIAL/PLATELET
Basophils Absolute: 0 10*3/uL (ref 0.0–0.2)
Basos: 1 %
EOS (ABSOLUTE): 0.2 10*3/uL (ref 0.0–0.4)
Eos: 3 %
Hematocrit: 39.1 % (ref 34.0–46.6)
Hemoglobin: 12.5 g/dL (ref 11.1–15.9)
Immature Grans (Abs): 0 10*3/uL (ref 0.0–0.1)
Immature Granulocytes: 0 %
Lymphocytes Absolute: 1.8 10*3/uL (ref 0.7–3.1)
Lymphs: 31 %
MCH: 27.3 pg (ref 26.6–33.0)
MCHC: 32 g/dL (ref 31.5–35.7)
MCV: 85 fL (ref 79–97)
Monocytes Absolute: 0.5 10*3/uL (ref 0.1–0.9)
Monocytes: 9 %
Neutrophils Absolute: 3.3 10*3/uL (ref 1.4–7.0)
Neutrophils: 56 %
Platelets: 315 10*3/uL (ref 150–450)
RBC: 4.58 x10E6/uL (ref 3.77–5.28)
RDW: 14 % (ref 11.7–15.4)
WBC: 5.9 10*3/uL (ref 3.4–10.8)

## 2020-08-16 LAB — LIPID PANEL
Chol/HDL Ratio: 3.4 ratio (ref 0.0–4.4)
Cholesterol, Total: 182 mg/dL (ref 100–199)
HDL: 53 mg/dL (ref >39)
LDL Chol Calc (NIH): 109 mg/dL — ABNORMAL HIGH (ref 0–99)
Triglycerides: 113 mg/dL (ref 0–149)
VLDL Cholesterol Cal: 20 mg/dL (ref 5–40)

## 2020-08-16 LAB — TSH: TSH: 0.735 u[IU]/mL (ref 0.450–4.500)

## 2020-08-16 LAB — VITAMIN D 25 HYDROXY (VIT D DEFICIENCY, FRACTURES): Vit D, 25-Hydroxy: 35.8 ng/mL (ref 30.0–100.0)

## 2020-08-17 NOTE — Progress Notes (Signed)
CBC within normal limits no anemia or signs of infection.  CMP with electrolytes, kidney and liver function within normal limits. LDL elevated.  Discuss lifestyle modification with patient e.g. increase exercise, fiber, fruits, vegetables, lean meat, and omega 3/fish intake and decrease saturated fat.  If patient following strict diet and exercise program already please schedule follow up appointment with primary care physician  TSH within normal limits.  Vitamin D is within normal limits.

## 2020-09-25 ENCOUNTER — Ambulatory Visit
Admission: RE | Admit: 2020-09-25 | Discharge: 2020-09-25 | Disposition: A | Discharge status: Home / Self Care | Payer: BC Managed Care – PPO | Source: Ambulatory Visit | Attending: Family Medicine | Admitting: Family Medicine

## 2020-09-25 ENCOUNTER — Other Ambulatory Visit: Payer: Self-pay

## 2020-09-25 VITALS — BP 124/75 | HR 93 | Temp 99.6°F | Resp 18 | Ht 61.0 in | Wt 137.8 lb

## 2020-09-25 DIAGNOSIS — N390 Urinary tract infection, site not specified: Secondary | ICD-10-CM

## 2020-09-25 DIAGNOSIS — M549 Dorsalgia, unspecified: Secondary | ICD-10-CM

## 2020-09-25 DIAGNOSIS — R300 Dysuria: Secondary | ICD-10-CM

## 2020-09-25 LAB — URINALYSIS, COMPLETE (UACMP) WITH MICROSCOPIC
Bilirubin Urine: NEGATIVE
Glucose, UA: NEGATIVE mg/dL
Nitrite: POSITIVE — AB
Protein, ur: >300 mg/dL — AB
RBC / HPF: >50 RBC/hpf (ref 0–5)
Specific Gravity, Urine: 1.025 (ref 1.005–1.030)
pH: 5.5 (ref 5.0–8.0)

## 2020-09-25 MED ORDER — CEPHALEXIN 500 MG PO CAPS: 500.0000 mg | ORAL_CAPSULE | Freq: Two times a day (BID) | ORAL | 0 refills | Status: AC

## 2020-09-25 NOTE — Discharge Instructions (Addendum)
Take the Keflex twice daily for 5 days.  Increase your oral fluid intake to increase your production and flush urinary tract.  Continue to use the over-the-counter Azo as needed for urinary discomfort.  If you develop any fever, back pain, nausea or vomiting please return for reevaluation or go to the ER.

## 2020-09-25 NOTE — ED Provider Notes (Signed)
[facility]    CSN: [id number] Arrival date & time: 09/25/20  1801      History   Chief Complaint Chief Complaint  Patient presents with  . Dysuria  . Back Pain    HPI Natalie Welch is a 29 y.o. female.   HPI   8-year-old female here for evaluation of back pain painful urination and urinary frequency.  Patient reports that her symptoms started 4 days ago.  Her pain has been more mid to lower back.  She has had urinary urgency and pain at the end of urination.  Patient denies fever, nausea, vomiting, blood in urine, color change, vaginal itching, vaginal burning, or vaginal discharge.  Does not have a previous history of UTIs that she is aware of.  Past Medical History:  Diagnosis Date  . Allergy   . Anemia   . Anxiety    no meds  . Complication of anesthesia   . Depression   . PONV (postoperative nausea and vomiting)     Patient Active Problem List   Diagnosis Date Noted  . Status post cesarean delivery 02/07/2020  . Arrest of dilation, delivered, current hospitalization 02/07/2020  . Gestational hypertension 02/07/2020  . Macrosomia affecting management of mother in third trimester, fetus 1 01/23/2020  . Macrosomia affecting management of mother in third trimester 01/17/2020  . Polyhydramnios affecting pregnancy in third trimester 12/21/2019  . Elevated glucose tolerance test 12/06/2019  . Anemia during pregnancy 11/16/2019  . Generalized anxiety disorder with panic attacks 08/05/2019  . Supervision of normal first pregnancy, antepartum 11/23/2018    Past Surgical History:  Procedure Laterality Date  . CESAREAN SECTION N/A 02/07/2020   Procedure: CESAREAN SECTION;  Surgeon: Conard Novak, MD;  Location: [facility] ORS;  Service: Obstetrics;  Laterality: N/A;  . DILATION AND EVACUATION N/A 12/30/2018   Procedure: DILATATION AND EVACUATION;  Surgeon: Vena Austria, MD;  Location: [facility] ORS;  Service: Gynecology;  Laterality: N/A;  . WISDOM TOOTH  EXTRACTION      OB History    Gravida  2   Para  1   Term  1   Preterm      AB  1   Living  1     SAB  1   TAB      Ectopic      Multiple  0   Live Births  1            Home Medications    Prior to Admission medications   Medication Sig Start Date End Date Taking? Authorizing Provider  citalopram (CELEXA) 20 MG tablet Take 1 tablet (20 mg total) by mouth daily. 07/20/20  Yes Schuman, Christanna R, MD  cephALEXin (KEFLEX) 500 MG capsule Take 1 capsule (500 mg total) by mouth 2 (two) times daily for 5 days. 09/25/20 09/30/20  Becky Augusta, NP    Family History Family History  Problem Relation Age of Onset  . Hypertension Mother   . Healthy Father     Social History Social History   Tobacco Use  . Smoking status: Never Smoker  . Smokeless tobacco: Never Used  Vaping Use  . Vaping Use: Never used  Substance Use Topics  . Alcohol use: Yes    Alcohol/week: 7.0 standard drinks    Types: 1 Cans of beer, 6 Shots of liquor per week  . Drug use: Never     Allergies   Abilify [aripiprazole] and Nuvigil [armodafinil]   Review of Systems Review of  Systems  Constitutional: Negative for activity change, appetite change and fever.  HENT: Negative for rhinorrhea and sore throat.   Respiratory: Negative for cough and shortness of breath.   Cardiovascular: Negative for chest pain.  Gastrointestinal: Negative for abdominal pain, diarrhea and vomiting.  Genitourinary: Positive for dysuria, frequency and urgency. Negative for difficulty urinating, hematuria, vaginal bleeding, vaginal discharge and vaginal pain.  Musculoskeletal: Positive for back pain.  Skin: Negative for rash.  Neurological: Negative for headaches.  Hematological: Negative.   Psychiatric/Behavioral: Negative.      Physical Exam Triage Vital Signs ED Triage Vitals  Enc Vitals Group     BP 09/25/20 1837 124/75     Pulse Rate 09/25/20 1837 93     Resp 09/25/20 1837 18     Temp 09/25/20  1837 99.6 F (37.6 C)     Temp Source 09/25/20 1837 Oral     SpO2 09/25/20 1837 100 %     Weight 09/25/20 1836 137 lb 12.6 oz (62.5 kg)     Height 09/25/20 1836 5\' 1"  (1.549 m)     Head Circumference --      Peak Flow --      Pain Score 09/25/20 1836 0     Pain Loc --      Pain Edu? --      Excl. in GC? --    No data found.  Updated Vital Signs BP 124/75 (BP Location: Right Arm)   Pulse 93   Temp 99.6 F (37.6 C) (Oral)   Resp 18   Ht 5\' 1"  (1.549 m)   Wt 137 lb 12.6 oz (62.5 kg)   LMP 09/07/2020   SpO2 100%   BMI 26.03 kg/m   Visual Acuity Right Eye Distance:   Left Eye Distance:   Bilateral Distance:    Right Eye Near:   Left Eye Near:    Bilateral Near:     Physical Exam Vitals and nursing note reviewed.  Constitutional:      General: She is not in acute distress.    Appearance: Normal appearance. She is normal weight. She is not toxic-appearing.  HENT:     Head: Normocephalic and atraumatic.  Eyes:     General: No scleral icterus.    Extraocular Movements: Extraocular movements intact.     Conjunctiva/sclera: Conjunctivae normal.     Pupils: Pupils are equal, round, and reactive to light.  Cardiovascular:     Rate and Rhythm: Normal rate and regular rhythm.     Pulses: Normal pulses.     Heart sounds: Normal heart sounds. No murmur heard.  No gallop.   Pulmonary:     Effort: Pulmonary effort is normal.     Breath sounds: Normal breath sounds. No wheezing, rhonchi or rales.  Abdominal:     Tenderness: There is no right CVA tenderness or left CVA tenderness.  Musculoskeletal:        General: No swelling or tenderness. Normal range of motion.  Skin:    General: Skin is warm and dry.     Capillary Refill: Capillary refill takes less than 2 seconds.     Findings: No erythema or rash.  Neurological:     General: No focal deficit present.     Mental Status: She is alert and oriented to person, place, and time.  Psychiatric:        Mood and Affect:  Mood normal.        Behavior: Behavior normal.  Thought Content: Thought content normal.        Judgment: Judgment normal.      UC Treatments / Results  Labs (all labs ordered are listed, but only abnormal results are displayed) Labs Reviewed  URINALYSIS, COMPLETE (UACMP) WITH MICROSCOPIC - Abnormal; Notable for the following components:      Result Value   Color, Urine ORANGE (*)    APPearance HAZY (*)    Hgb urine dipstick MODERATE (*)    Ketones, ur TRACE (*)    Protein, ur >300 (*)    Nitrite POSITIVE (*)    Leukocytes,Ua TRACE (*)    Bacteria, UA MANY (*)    All other components within normal limits  URINE CULTURE    EKG   Radiology No results found.  Procedures Procedures (including critical care time)  Medications Ordered in UC Medications - No data to display  Initial Impression / Assessment and Plan / UC Course  I have reviewed the triage vital signs and the nursing notes.  Pertinent labs & imaging results that were available during my care of the patient were reviewed by me and considered in my medical decision making (see chart for details).   Patient is here for evaluation of UTI symptoms.  Patient has had painful urination with increased frequency and urgency x4 days.  Patient denies fever.  Patient is nontoxic-appearing on exam.  No flank pain, no CVA tenderness.  Will send UA for analysis.  UA shows moderate blood, trace ketones, greater than 300 protein, nitrite positive, trace leukocytes, 6-10 white blood cells greater than 50 red blood cells and many bacteria.  Will send for culture.  Will discharge patient home with diagnosis of UTI and put on Keflex twice daily x5 days.  Final Clinical Impressions(s) / UC Diagnoses   Final diagnoses:  Lower urinary tract infectious disease     Discharge Instructions     Take the Keflex twice daily for 5 days.  Increase your oral fluid intake to increase your production and flush urinary  tract.  Continue to use the over-the-counter Azo as needed for urinary discomfort.  If you develop any fever, back pain, nausea or vomiting please return for reevaluation or go to the ER.    ED Prescriptions    Medication Sig Dispense Auth. Provider   cephALEXin (KEFLEX) 500 MG capsule Take 1 capsule (500 mg total) by mouth 2 (two) times daily for 5 days. 10 capsule Becky Augusta, NP     PDMP not reviewed this encounter.   Becky Augusta, NP 09/25/20 1911

## 2020-09-25 NOTE — ED Triage Notes (Signed)
Patient c/o back pain, dysuria and urinary frequency that started Friday.

## 2020-09-28 ENCOUNTER — Encounter: Payer: Self-pay | Admitting: Adult Health

## 2020-09-28 DIAGNOSIS — E785 Hyperlipidemia, unspecified: Secondary | ICD-10-CM | POA: Insufficient documentation

## 2020-09-28 LAB — URINE CULTURE
Culture: >=100000 — AB
Special Requests: NORMAL

## 2020-11-15 ENCOUNTER — Encounter: Payer: Self-pay | Admitting: Adult Health

## 2020-11-20 ENCOUNTER — Telehealth (INDEPENDENT_AMBULATORY_CARE_PROVIDER_SITE_OTHER): Payer: BC Managed Care – PPO | Admitting: Physician Assistant

## 2020-11-20 DIAGNOSIS — R1110 Vomiting, unspecified: Secondary | ICD-10-CM

## 2020-11-20 DIAGNOSIS — R197 Diarrhea, unspecified: Secondary | ICD-10-CM

## 2020-11-20 MED ORDER — DIPHENOXYLATE-ATROPINE 2.5-0.025 MG PO TABS: 1.0000 | ORAL_TABLET | Freq: Four times a day (QID) | ORAL | 0 refills | Status: DC | PRN

## 2020-11-20 NOTE — Patient Instructions (Signed)

## 2020-11-20 NOTE — Progress Notes (Signed)
MyChart Video Visit    Virtual Visit via Video Note   This visit type was conducted due to national recommendations for restrictions regarding the COVID-19 Pandemic (e.g. social distancing) in an effort to limit this patient's exposure and mitigate transmission in our community. This patient is at least at moderate risk for complications without adequate follow up. This format is felt to be most appropriate for this patient at this time. Physical exam was limited by quality of the video and audio technology used for the visit.   Patient location: Home Provider location: Office  I discussed the limitations of evaluation and management by telemedicine and the availability of in person appointments. The patient expressed understanding and agreed to proceed.  Patient: Natalie Welch   DOB: [birth year]   29 y.o. Female  MRN: [id number] Visit Date: 11/20/2020  Today's healthcare provider: Trey Sailors, PA-C   Chief Complaint  Patient presents with  . Diarrhea  I,Lillis Nuttle M Roshunda Keir,acting as a scribe for Trey Sailors, PA-C.,have documented all relevant documentation on the behalf of Trey Sailors, PA-C,as directed by  Trey Sailors, PA-C while in the presence of Trey Sailors, PA-C.  Subjective    Diarrhea  This is a new problem. The current episode started in the past 7 days. The problem has been unchanged. The stool consistency is described as watery. The patient states that diarrhea awakens her from sleep. Associated symptoms include headaches and vomiting. Pertinent negatives include no abdominal pain, bloating, chills, coughing, fever or URI. Nothing aggravates the symptoms. There is no history of bowel resection, inflammatory bowel disease, irritable bowel syndrome or a recent abdominal surgery.    Reports Tuesday 11/13/2020 she had vomiting and diarrhea. Her husband was having symptoms the Friday before. She has experienced resolution of nausea and vomiting,     Medications: Outpatient Medications Prior to Visit  Medication Sig  . citalopram (CELEXA) 20 MG tablet Take 1 tablet (20 mg total) by mouth daily.   No facility-administered medications prior to visit.    Review of Systems  Constitutional: Negative for chills and fever.  HENT: Negative for congestion, postnasal drip, rhinorrhea, sinus pressure, sinus pain, sneezing and sore throat.   Respiratory: Negative for cough, chest tightness, shortness of breath and wheezing.   Gastrointestinal: Positive for diarrhea, nausea and vomiting. Negative for abdominal pain and bloating.  Neurological: Positive for headaches.      Objective    There were no vitals taken for this visit.   Physical Exam Constitutional:      Appearance: Normal appearance.  Pulmonary:     Effort: Pulmonary effort is normal. No respiratory distress.  Neurological:     Mental Status: She is alert.  Psychiatric:        Mood and Affect: Mood normal.        Behavior: Behavior normal.        Assessment & Plan    1. Vomiting and diarrhea  - diphenoxylate-atropine (LOMOTIL) 2.5-0.025 MG tablet; Take 1 tablet by mouth 4 (four) times daily as needed for diarrhea or loose stools.  Dispense: 30 tablet; Refill: 0   No follow-ups on file.     I discussed the assessment and treatment plan with the patient. The patient was provided an opportunity to ask questions and all were answered. The patient agreed with the plan and demonstrated an understanding of the instructions.   The patient was advised to call back or seek an in-person evaluation if the symptoms  worsen or if the condition fails to improve as anticipated.   The entirety of the information documented in the History of Present Illness, Review of Systems and Physical Exam were personally obtained by me. Portions of this information were initially documented by [facility] and reviewed by me for thoroughness and accuracy.   ITrey Sailors, PA-C,  have reviewed all documentation for this visit. The documentation on 11/20/20 for the exam, diagnosis, procedures, and orders are all accurate and complete.   Maryella Shivers [facility] [phone] (phone) [phone] (fax)  [facility]

## 2021-02-12 ENCOUNTER — Other Ambulatory Visit: Payer: Self-pay | Admitting: Obstetrics and Gynecology

## 2021-02-12 DIAGNOSIS — F32A Depression, unspecified: Secondary | ICD-10-CM

## 2021-02-12 DIAGNOSIS — F419 Anxiety disorder, unspecified: Secondary | ICD-10-CM

## 2021-05-20 ENCOUNTER — Other Ambulatory Visit
Admission: RE | Admit: 2021-05-20 | Discharge: 2021-05-20 | Disposition: A | Discharge status: Home / Self Care | Payer: BC Managed Care – PPO | Source: Ambulatory Visit | Attending: Internal Medicine | Admitting: Internal Medicine

## 2021-05-20 ENCOUNTER — Ambulatory Visit: Payer: BC Managed Care – PPO

## 2021-05-20 DIAGNOSIS — Z3202 Encounter for pregnancy test, result negative: Secondary | ICD-10-CM | POA: Diagnosis not present

## 2021-05-20 DIAGNOSIS — K219 Gastro-esophageal reflux disease without esophagitis: Secondary | ICD-10-CM | POA: Diagnosis not present

## 2021-05-20 DIAGNOSIS — R072 Precordial pain: Secondary | ICD-10-CM | POA: Insufficient documentation

## 2021-05-20 DIAGNOSIS — R079 Chest pain, unspecified: Secondary | ICD-10-CM | POA: Diagnosis not present

## 2021-05-20 DIAGNOSIS — R112 Nausea with vomiting, unspecified: Secondary | ICD-10-CM | POA: Diagnosis not present

## 2021-05-20 DIAGNOSIS — R509 Fever, unspecified: Secondary | ICD-10-CM | POA: Diagnosis not present

## 2021-05-20 LAB — D-DIMER, QUANTITATIVE: D-Dimer, Quant: 1.06 ug/mL-FEU — ABNORMAL HIGH (ref 0.00–0.50)

## 2021-05-23 ENCOUNTER — Other Ambulatory Visit: Payer: Self-pay

## 2021-05-23 ENCOUNTER — Ambulatory Visit (INDEPENDENT_AMBULATORY_CARE_PROVIDER_SITE_OTHER): Payer: BC Managed Care – PPO | Admitting: Family Medicine

## 2021-05-23 ENCOUNTER — Encounter: Payer: Self-pay | Admitting: Family Medicine

## 2021-05-23 VITALS — BP 123/72 | HR 79 | Temp 98.6°F | Ht 61.0 in | Wt 156.4 lb

## 2021-05-23 DIAGNOSIS — R071 Chest pain on breathing: Secondary | ICD-10-CM | POA: Diagnosis not present

## 2021-05-23 NOTE — Progress Notes (Signed)
Established patient visit   Patient: Natalie Welch   DOB: [birth year]   30 y.o. Female  MRN: [id number] Visit Date: 05/23/2021  Today's healthcare provider: Dortha Kern, PA-C   No chief complaint on file.  Subjective    HPI  Follow up for chest pain  The patient was last seen for this 3 days ago at [facility] Changes made at last visit include Take medications as prescribed. Drink plenty of fluids and rest. Reduce or avoid aspirin or aspirin products; spicy or irritating foods; large meals; caffeine, nicotine, alcohol, and/or chocolate - particularly in the evening. Follow up with primary care physician within 1-2 weeks; sooner or emergency room if problems or worsens.   .  She reports excellent compliance with treatment. She feels that condition is Improved. She is having side effects. Tightness but no longer stabbing pain. Comes and goes.    -----------------------------------------------------------------------------------------   Patient Active Problem List   Diagnosis Date Noted   Hyperlipidemia 09/28/2020   Status post cesarean delivery 02/07/2020   Arrest of dilation, delivered, current hospitalization 02/07/2020   Gestational hypertension 02/07/2020   Macrosomia affecting management of mother in third trimester, fetus 1 01/23/2020   Macrosomia affecting management of mother in third trimester 01/17/2020   Polyhydramnios affecting pregnancy in third trimester 12/21/2019   Elevated glucose tolerance test 12/06/2019   Anemia during pregnancy 11/16/2019   Generalized anxiety disorder with panic attacks 08/05/2019   Supervision of normal first pregnancy, antepartum 11/23/2018   Past Medical History:  Diagnosis Date   Allergy    Anemia    Anxiety    no meds   Complication of anesthesia    Depression    PONV (postoperative nausea and vomiting)    Past Surgical History:  Procedure Laterality Date   CESAREAN SECTION N/A 02/07/2020   Procedure:  CESAREAN SECTION;  Surgeon: Conard Novak, MD;  Location: [facility] ORS;  Service: Obstetrics;  Laterality: N/A;   DILATION AND EVACUATION N/A 12/30/2018   Procedure: DILATATION AND EVACUATION;  Surgeon: Vena Austria, MD;  Location: [facility] ORS;  Service: Gynecology;  Laterality: N/A;   WISDOM TOOTH EXTRACTION     Family History  Problem Relation Age of Onset   Hypertension Mother    Healthy Father     Allergies  Allergen Reactions   Abilify [Aripiprazole] Other (See Comments)    Oculogyric crisis --eyes rolled in back of head   Nuvigil [Armodafinil] Swelling    Tongue and throat swelling       Medications: Outpatient Medications Prior to Visit  Medication Sig   citalopram (CELEXA) 20 MG tablet TAKE 1 TABLET BY MOUTH EVERY DAY   diphenoxylate-atropine (LOMOTIL) 2.5-0.025 MG tablet Take 1 tablet by mouth 4 (four) times daily as needed for diarrhea or loose stools.   No facility-administered medications prior to visit.    Review of Systems  Constitutional: Negative.   HENT: Negative.    Eyes: Negative.   Respiratory: Negative.    Cardiovascular:  Positive for chest pain.  Genitourinary: Negative.    Last CBC Lab Results  Component Value Date   WBC 5.9 08/15/2020   HGB 12.5 08/15/2020   HCT 39.1 08/15/2020   MCV 85 08/15/2020   MCH 27.3 08/15/2020   RDW 14.0 08/15/2020   PLT 315 08/15/2020   Last metabolic panel Lab Results  Component Value Date   GLUCOSE 84 08/15/2020   NA 138 08/15/2020   K 4.9 08/15/2020   CL 103 08/15/2020  CO2 23 08/15/2020   BUN 10 08/15/2020   CREATININE 0.80 08/15/2020   GFRNONAA 101 08/15/2020   GFRAA 116 08/15/2020   CALCIUM 9.6 08/15/2020   PROT 7.5 08/15/2020   ALBUMIN 4.6 08/15/2020   LABGLOB 2.9 08/15/2020   AGRATIO 1.6 08/15/2020   BILITOT 0.3 08/15/2020   ALKPHOS 86 08/15/2020   AST 16 08/15/2020   ALT 14 08/15/2020   ANIONGAP 8 02/06/2020   Last lipids Lab Results  Component Value Date   CHOL 182 08/15/2020    HDL 53 08/15/2020   LDLCALC 109 (H) 08/15/2020   TRIG 113 08/15/2020   CHOLHDL 3.4 08/15/2020   Last thyroid functions Lab Results  Component Value Date   TSH 0.735 08/15/2020   Last vitamin D Lab Results  Component Value Date   VD25OH 35.8 08/15/2020       Objective    There were no vitals taken for this visit. BP Readings from Last 3 Encounters:  09/25/20 124/75  08/15/20 102/67  06/22/20 132/66   Wt Readings from Last 3 Encounters:  09/25/20 137 lb 12.6 oz (62.5 kg)  08/15/20 137 lb 12.8 oz (62.5 kg)  07/20/20 132 lb (59.9 kg)       Physical Exam Constitutional:      General: She is not in acute distress.    Appearance: She is well-developed.  HENT:     Head: Normocephalic and atraumatic.     Right Ear: Hearing normal.     Left Ear: Hearing normal.     Nose: Nose normal.  Eyes:     General: Lids are normal. No scleral icterus.       Right eye: No discharge.        Left eye: No discharge.     Conjunctiva/sclera: Conjunctivae normal.  Cardiovascular:     Rate and Rhythm: Normal rate and regular rhythm.     Heart sounds: Normal heart sounds.  Pulmonary:     Effort: Pulmonary effort is normal. No respiratory distress.     Breath sounds: Normal breath sounds.  Chest:     Chest wall: Tenderness present.     Comments: Slight tenderness to palpate along sternum. Musculoskeletal:        General: Normal range of motion.     Cervical back: Normal range of motion and neck supple.  Skin:    Findings: No lesion or rash.  Neurological:     Mental Status: She is alert and oriented to person, place, and time.  Psychiatric:        Speech: Speech normal.        Behavior: Behavior normal.        Thought Content: Thought content normal.     No results found for any visits on 05/23/21.  Assessment & Plan     1. Chest pain on breathing Onset with sharp pain taking deep breaths 3-4 days ago. No specific injury known. No respiratory symptoms. Went to [facility] Urgent care  and CBC showed WBC 10,500 and D-dimer 1.06. Using Prilosec has helped ease the pain. Recommend she recheck D-dimer and CBC. May need CTA if still elevated. Should consider recheck of COVID test. - D-Dimer, Quantitative - CBC with Differential/Platelet   No follow-ups on file.      I, Tymel Conely, PA-C, have reviewed all documentation for this visit. The documentation on 05/23/21 for the exam, diagnosis, procedures, and orders are all accurate and complete.    Dortha Kern, PA-C  [employer] [phone] (phone)  [phone] (fax)  [place]

## 2021-05-23 NOTE — Patient Instructions (Incomplete)
Preventive Care 21-29 Years Old, Female Preventive care refers to lifestyle choices and visits with your health care provider that can promote health and wellness. This includes: A yearly physical exam. This is also called an annual wellness visit. Regular dental and eye exams. Immunizations. Screening for certain conditions. Healthy lifestyle choices, such as: Eating a healthy diet. Getting regular exercise. Not using drugs or products that contain nicotine and tobacco. Limiting alcohol use. What can I expect for my preventive care visit? Physical exam Your health care provider may check your: Height and weight. These may be used to calculate your BMI (body mass index). BMI is a measurement that tells if you are at a healthy weight. Heart rate and blood pressure. Body temperature. Skin for abnormal spots. Counseling Your health care provider may ask you questions about your: Past medical problems. Family's medical history. Alcohol, tobacco, and drug use. Emotional well-being. Home life and relationship well-being. Sexual activity. Diet, exercise, and sleep habits. Work and work environment. Access to firearms. Method of birth control. Menstrual cycle. Pregnancy history. What immunizations do I need?  Vaccines are usually given at various ages, according to a schedule. Your health care provider will recommend vaccines for you based on your age, medicalhistory, and lifestyle or other factors, such as travel or where you work. What tests do I need?  Blood tests Lipid and cholesterol levels. These may be checked every 5 years starting at age 20. Hepatitis C test. Hepatitis B test. Screening Diabetes screening. This is done by checking your blood sugar (glucose) after you have not eaten for a while (fasting). STD (sexually transmitted disease) testing, if you are at risk. BRCA-related cancer screening. This may be done if you have a family history of breast, ovarian, tubal, or  peritoneal cancers. Pelvic exam and Pap test. This may be done every 3 years starting at age 21. Starting at age 30, this may be done every 5 years if you have a Pap test in combination with an HPV test. Talk with your health care provider about your test results, treatment options,and if necessary, the need for more tests. Follow these instructions at home: Eating and drinking  Eat a healthy diet that includes fresh fruits and vegetables, whole grains, lean protein, and low-fat dairy products. Take vitamin and mineral supplements as recommended by your health care provider. Do not drink alcohol if: Your health care provider tells you not to drink. You are pregnant, may be pregnant, or are planning to become pregnant. If you drink alcohol: Limit how much you have to 0-1 drink a day. Be aware of how much alcohol is in your drink. In the [place] one drink equals one 12 oz bottle of beer (355 mL), one 5 oz glass of wine (148 mL), or one 1 oz glass of hard liquor (44 mL).  Lifestyle Take daily care of your teeth and gums. Brush your teeth every morning and night with fluoride toothpaste. Floss one time each day. Stay active. Exercise for at least 30 minutes 5 or more days each week. Do not use any products that contain nicotine or tobacco, such as cigarettes, e-cigarettes, and chewing tobacco. If you need help quitting, ask your health care provider. Do not use drugs. If you are sexually active, practice safe sex. Use a condom or other form of protection to prevent STIs (sexually transmitted infections). If you do not wish to become pregnant, use a form of birth control. If you plan to become pregnant, see your health care   provider for a prepregnancy visit. Find healthy ways to cope with stress, such as: Meditation, yoga, or listening to music. Journaling. Talking to a trusted person. Spending time with friends and family. Safety Always wear your seat belt while driving or riding in a  vehicle. Do not drive: If you have been drinking alcohol. Do not ride with someone who has been drinking. When you are tired or distracted. While texting. Wear a helmet and other protective equipment during sports activities. If you have firearms in your house, make sure you follow all gun safety procedures. Seek help if you have been physically or sexually abused. What's next? Go to your health care provider once a year for an annual wellness visit. Ask your health care provider how often you should have your eyes and teeth checked. Stay up to date on all vaccines. This information is not intended to replace advice given to you by your health care provider. Make sure you discuss any questions you have with your healthcare provider. Document Revised: 06/10/2020 Document Reviewed: 06/24/2018 Elsevier Patient Education  2022 [employer].

## 2021-05-29 LAB — CBC WITH DIFFERENTIAL/PLATELET
Basophils Absolute: 0 10*3/uL (ref 0.0–0.2)
Basos: 0 %
EOS (ABSOLUTE): 0.2 10*3/uL (ref 0.0–0.4)
Eos: 3 %
Hematocrit: 39.9 % (ref 34.0–46.6)
Hemoglobin: 12.6 g/dL (ref 11.1–15.9)
Immature Grans (Abs): 0 10*3/uL (ref 0.0–0.1)
Immature Granulocytes: 0 %
Lymphocytes Absolute: 2 10*3/uL (ref 0.7–3.1)
Lymphs: 28 %
MCH: 27.6 pg (ref 26.6–33.0)
MCHC: 31.6 g/dL (ref 31.5–35.7)
MCV: 88 fL (ref 79–97)
Monocytes Absolute: 0.6 10*3/uL (ref 0.1–0.9)
Monocytes: 9 %
Neutrophils Absolute: 4.4 10*3/uL (ref 1.4–7.0)
Neutrophils: 60 %
Platelets: 285 10*3/uL (ref 150–450)
RBC: 4.56 x10E6/uL (ref 3.77–5.28)
RDW: 13.5 % (ref 11.7–15.4)
WBC: 7.3 10*3/uL (ref 3.4–10.8)

## 2021-05-29 LAB — D-DIMER, QUANTITATIVE: D-DIMER: 1.1 mg/L FEU — ABNORMAL HIGH (ref 0.00–0.49)

## 2021-06-10 ENCOUNTER — Observation Stay
Admission: EM | Admit: 2021-06-10 | Discharge: 2021-06-12 | Disposition: A | Discharge status: Home / Self Care | Payer: BC Managed Care – PPO | Attending: General Surgery | Admitting: General Surgery

## 2021-06-10 ENCOUNTER — Other Ambulatory Visit: Payer: Self-pay

## 2021-06-10 ENCOUNTER — Emergency Department: Payer: BC Managed Care – PPO

## 2021-06-10 DIAGNOSIS — R1011 Right upper quadrant pain: Secondary | ICD-10-CM | POA: Diagnosis not present

## 2021-06-10 DIAGNOSIS — K810 Acute cholecystitis: Secondary | ICD-10-CM | POA: Diagnosis not present

## 2021-06-10 DIAGNOSIS — K8012 Calculus of gallbladder with acute and chronic cholecystitis without obstruction: Principal | ICD-10-CM | POA: Insufficient documentation

## 2021-06-10 DIAGNOSIS — Z20822 Contact with and (suspected) exposure to covid-19: Secondary | ICD-10-CM | POA: Diagnosis not present

## 2021-06-10 DIAGNOSIS — I499 Cardiac arrhythmia, unspecified: Secondary | ICD-10-CM | POA: Diagnosis not present

## 2021-06-10 DIAGNOSIS — Z5321 Procedure and treatment not carried out due to patient leaving prior to being seen by health care provider: Secondary | ICD-10-CM | POA: Diagnosis not present

## 2021-06-10 DIAGNOSIS — K819 Cholecystitis, unspecified: Secondary | ICD-10-CM

## 2021-06-10 DIAGNOSIS — K8000 Calculus of gallbladder with acute cholecystitis without obstruction: Secondary | ICD-10-CM | POA: Diagnosis present

## 2021-06-10 DIAGNOSIS — R079 Chest pain, unspecified: Secondary | ICD-10-CM | POA: Diagnosis not present

## 2021-06-10 DIAGNOSIS — K8020 Calculus of gallbladder without cholecystitis without obstruction: Secondary | ICD-10-CM | POA: Diagnosis not present

## 2021-06-10 DIAGNOSIS — K760 Fatty (change of) liver, not elsewhere classified: Secondary | ICD-10-CM | POA: Diagnosis not present

## 2021-06-10 DIAGNOSIS — R109 Unspecified abdominal pain: Secondary | ICD-10-CM

## 2021-06-10 DIAGNOSIS — R9431 Abnormal electrocardiogram [ECG] [EKG]: Secondary | ICD-10-CM | POA: Diagnosis not present

## 2021-06-10 HISTORY — DX: Calculus of gallbladder with acute cholecystitis without obstruction: K80.00

## 2021-06-10 LAB — RESP PANEL BY RT-PCR (FLU A&B, COVID) ARPGX2
Influenza A by PCR: NEGATIVE
Influenza B by PCR: NEGATIVE
SARS Coronavirus 2 by RT PCR: NEGATIVE

## 2021-06-10 LAB — COMPREHENSIVE METABOLIC PANEL
ALT: 279 U/L — ABNORMAL HIGH (ref 0–44)
AST: 349 U/L — ABNORMAL HIGH (ref 15–41)
Albumin: 4.5 g/dL (ref 3.5–5.0)
Alkaline Phosphatase: 107 U/L (ref 38–126)
Anion gap: 11 (ref 5–15)
BUN: 10 mg/dL (ref 6–20)
CO2: 25 mmol/L (ref 22–32)
Calcium: 9.5 mg/dL (ref 8.9–10.3)
Chloride: 102 mmol/L (ref 98–111)
Creatinine, Ser: 0.82 mg/dL (ref 0.44–1.00)
GFR, Estimated: >60 mL/min (ref >60)
Glucose, Bld: 122 mg/dL — ABNORMAL HIGH (ref 70–99)
Potassium: 4.8 mmol/L (ref 3.5–5.1)
Sodium: 138 mmol/L (ref 135–145)
Total Bilirubin: 2 mg/dL — ABNORMAL HIGH (ref 0.3–1.2)
Total Protein: 8.3 g/dL — ABNORMAL HIGH (ref 6.5–8.1)

## 2021-06-10 LAB — CBC WITH DIFFERENTIAL/PLATELET
Abs Immature Granulocytes: 0.04 10*3/uL (ref 0.00–0.07)
Basophils Absolute: 0 10*3/uL (ref 0.0–0.1)
Basophils Relative: 0 %
Eosinophils Absolute: 0 10*3/uL (ref 0.0–0.5)
Eosinophils Relative: 0 %
HCT: 37.7 % (ref 36.0–46.0)
Hemoglobin: 12.6 g/dL (ref 12.0–15.0)
Immature Granulocytes: 0 %
Lymphocytes Relative: 7 %
Lymphs Abs: 0.9 10*3/uL (ref 0.7–4.0)
MCH: 29.2 pg (ref 26.0–34.0)
MCHC: 33.4 g/dL (ref 30.0–36.0)
MCV: 87.5 fL (ref 80.0–100.0)
Monocytes Absolute: 0.8 10*3/uL (ref 0.1–1.0)
Monocytes Relative: 7 %
Neutro Abs: 10.7 10*3/uL — ABNORMAL HIGH (ref 1.7–7.7)
Neutrophils Relative %: 86 %
Platelets: 338 10*3/uL (ref 150–400)
RBC: 4.31 MIL/uL (ref 3.87–5.11)
RDW: 13.5 % (ref 11.5–15.5)
WBC: 12.5 10*3/uL — ABNORMAL HIGH (ref 4.0–10.5)
nRBC: 0 % (ref 0.0–0.2)

## 2021-06-10 LAB — URINALYSIS, COMPLETE (UACMP) WITH MICROSCOPIC
Bacteria, UA: NONE SEEN
Bilirubin Urine: NEGATIVE
Glucose, UA: NEGATIVE mg/dL
Hgb urine dipstick: NEGATIVE
Ketones, ur: 5 mg/dL — AB
Leukocytes,Ua: NEGATIVE
Nitrite: NEGATIVE
Protein, ur: 100 mg/dL — AB
Specific Gravity, Urine: 1.029 (ref 1.005–1.030)
pH: 7 (ref 5.0–8.0)

## 2021-06-10 LAB — LIPASE, BLOOD: Lipase: 30 U/L (ref 11–51)

## 2021-06-10 LAB — PREGNANCY, URINE: Preg Test, Ur: NEGATIVE

## 2021-06-10 MED ORDER — SIMETHICONE 80 MG PO CHEW
40.0000 mg | CHEWABLE_TABLET | Freq: Four times a day (QID) | ORAL | Status: DC | PRN
  Administered 2021-06-12: 40 mg via ORAL
  Filled 2021-06-10 (×2): qty 1

## 2021-06-10 MED ORDER — IBUPROFEN 600 MG PO TABS
600.0000 mg | ORAL_TABLET | Freq: Four times a day (QID) | ORAL | Status: DC | PRN
  Administered 2021-06-12 (×2): 600 mg via ORAL
  Filled 2021-06-10 (×2): qty 1

## 2021-06-10 MED ORDER — ACETAMINOPHEN 500 MG PO TABS
1000.0000 mg | ORAL_TABLET | Freq: Four times a day (QID) | ORAL | Status: DC
  Administered 2021-06-10 – 2021-06-12 (×5): 1000 mg via ORAL
  Filled 2021-06-10 (×5): qty 2

## 2021-06-10 MED ORDER — HYDROMORPHONE HCL 1 MG/ML IJ SOLN
0.5000 mg | INTRAMUSCULAR | Status: DC | PRN
  Administered 2021-06-11 – 2021-06-12 (×2): 0.5 mg via INTRAVENOUS
  Filled 2021-06-10 (×3): qty 1

## 2021-06-10 MED ORDER — CITALOPRAM HYDROBROMIDE 20 MG PO TABS
20.0000 mg | ORAL_TABLET | Freq: Every day | ORAL | Status: DC
  Administered 2021-06-11 – 2021-06-12 (×2): 20 mg via ORAL
  Filled 2021-06-10 (×2): qty 1

## 2021-06-10 MED ORDER — KETOROLAC TROMETHAMINE 30 MG/ML IJ SOLN
30.0000 mg | Freq: Four times a day (QID) | INTRAMUSCULAR | Status: DC | PRN
  Administered 2021-06-11: 30 mg via INTRAVENOUS
  Filled 2021-06-10: qty 1

## 2021-06-10 MED ORDER — PIPERACILLIN-TAZOBACTAM 3.375 G IVPB
3.3750 g | Freq: Three times a day (TID) | INTRAVENOUS | Status: DC
  Administered 2021-06-11 – 2021-06-12 (×4): 3.375 g via INTRAVENOUS
  Filled 2021-06-10 (×6): qty 50

## 2021-06-10 MED ORDER — IOHEXOL 350 MG/ML SOLN: 100.0000 mL | Freq: Once | INTRAVENOUS | Status: DC | PRN

## 2021-06-10 MED ORDER — MORPHINE SULFATE (PF) 4 MG/ML IV SOLN
4.0000 mg | Freq: Once | INTRAVENOUS | Status: AC
Start: 2021-06-10 — End: 2021-06-10
  Administered 2021-06-10: 4 mg via INTRAVENOUS
  Filled 2021-06-10: qty 1

## 2021-06-10 MED ORDER — LACTATED RINGERS IV BOLUS
1000.0000 mL | Freq: Once | INTRAVENOUS | Status: AC
  Administered 2021-06-10: 1000 mL via INTRAVENOUS

## 2021-06-10 MED ORDER — PIPERACILLIN-TAZOBACTAM 3.375 G IVPB 30 MIN
3.3750 g | Freq: Once | INTRAVENOUS | Status: AC
  Administered 2021-06-10: 3.375 g via INTRAVENOUS
  Filled 2021-06-10: qty 50

## 2021-06-10 MED ORDER — ONDANSETRON HCL 4 MG/2ML IJ SOLN
4.0000 mg | Freq: Once | INTRAMUSCULAR | Status: AC
  Administered 2021-06-10: 4 mg via INTRAVENOUS
  Filled 2021-06-10: qty 2

## 2021-06-10 MED ORDER — ONDANSETRON HCL 4 MG/2ML IJ SOLN
4.0000 mg | Freq: Four times a day (QID) | INTRAMUSCULAR | Status: DC | PRN
  Administered 2021-06-11 (×2): 4 mg via INTRAVENOUS
  Filled 2021-06-10 (×2): qty 2

## 2021-06-10 MED ORDER — ONDANSETRON 4 MG PO TBDP
4.0000 mg | ORAL_TABLET | Freq: Four times a day (QID) | ORAL | Status: DC | PRN
  Administered 2021-06-12: 4 mg via ORAL
  Filled 2021-06-10: qty 1

## 2021-06-10 MED ORDER — DEXTROSE IN LACTATED RINGERS 5 % IV SOLN: INTRAVENOUS | Status: DC

## 2021-06-10 NOTE — Progress Notes (Signed)
Patient with abdominal pain. Labs and imaging consistent with acute calculus cholecystitis.  Will plan for robot-assisted lap chole tomorrow, pending OR and anesthesia availability.  Full H&P to follow.

## 2021-06-10 NOTE — ED Triage Notes (Signed)
Pt to ED for chest pain that started yesterday, worsens with breathing. Emesis today. Was seen at [facility], last visible in epic.  Pt tearful in triage.

## 2021-06-10 NOTE — ED Notes (Signed)
Patient updated on inpatient room assignment, awaiting transport.

## 2021-06-10 NOTE — ED Provider Notes (Signed)
[facility] Emergency Department Provider Note   ____________________________________________   None    (approximate)  I have reviewed the triage vital signs and the nursing notes.   HISTORY  Chief Complaint Chest Pain    HPI Natalie Welch is a 30 y.o. female with past medical history of hypertension, hyperlipidemia, and anxiety who presents to the ED complaining of chest and abdominal pain.  Patient reports that she has been dealing with intermittent pain in the center of her chest as well as her epigastrium and right upper quadrant for the past couple of days.  She had attributed it to reflux but pain became severe and constant over the past 24 hours.  It is been associated with nausea and multiple episodes of vomiting, she states the pain is sharp and worse when she takes a deep breath.  Pain seems to radiate to the middle of her back.  She denies any fevers, has not had any dysuria or flank pain.  She denies any cough or shortness of breath.  She denies any history of gallstones.        Past Medical History:  Diagnosis Date   Allergy    Anemia    Anxiety    no meds   Complication of anesthesia    Depression    PONV (postoperative nausea and vomiting)     Patient Active Problem List   Diagnosis Date Noted   Hyperlipidemia 09/28/2020   Status post cesarean delivery 02/07/2020   Arrest of dilation, delivered, current hospitalization 02/07/2020   Gestational hypertension 02/07/2020   Macrosomia affecting management of mother in third trimester, fetus 1 01/23/2020   Macrosomia affecting management of mother in third trimester 01/17/2020   Polyhydramnios affecting pregnancy in third trimester 12/21/2019   Elevated glucose tolerance test 12/06/2019   Anemia during pregnancy 11/16/2019   Generalized anxiety disorder with panic attacks 08/05/2019   Supervision of normal first pregnancy, antepartum 11/23/2018    Past Surgical History:   Procedure Laterality Date   CESAREAN SECTION N/A 02/07/2020   Procedure: CESAREAN SECTION;  Surgeon: Conard Novak, MD;  Location: [facility] ORS;  Service: Obstetrics;  Laterality: N/A;   DILATION AND EVACUATION N/A 12/30/2018   Procedure: DILATATION AND EVACUATION;  Surgeon: Vena Austria, MD;  Location: [facility] ORS;  Service: Gynecology;  Laterality: N/A;   WISDOM TOOTH EXTRACTION      Prior to Admission medications   Medication Sig Start Date End Date Taking? Authorizing Provider  citalopram (CELEXA) 20 MG tablet TAKE 1 TABLET BY MOUTH EVERY DAY 02/12/21   Schuman, Christanna R, MD  diphenoxylate-atropine (LOMOTIL) 2.5-0.025 MG tablet Take 1 tablet by mouth 4 (four) times daily as needed for diarrhea or loose stools. Patient not taking: Reported on 05/23/2021 11/20/20   Trey Sailors, PA-C    Allergies Abilify [aripiprazole] and Nuvigil [armodafinil]  Family History  Problem Relation Age of Onset   Hypertension Mother    Healthy Father     Social History Social History   Tobacco Use   Smoking status: Never   Smokeless tobacco: Never  Vaping Use   Vaping Use: Never used  Substance Use Topics   Alcohol use: Yes    Alcohol/week: 7.0 standard drinks    Types: 1 Cans of beer, 6 Shots of liquor per week   Drug use: Never    Review of Systems  Constitutional: No fever/chills Eyes: No visual changes. ENT: No sore throat. Cardiovascular: Positive for chest pain. Respiratory: Denies shortness  of breath. Gastrointestinal: Positive for abdominal pain, nausea, and vomiting.  No diarrhea.  No constipation. Genitourinary: Negative for dysuria. Musculoskeletal: Negative for back pain. Skin: Negative for rash. Neurological: Negative for headaches, focal weakness or numbness.  ____________________________________________   PHYSICAL EXAM:  VITAL SIGNS: ED Triage Vitals  Enc Vitals Group     BP 06/10/21 1810 112/72     Pulse Rate 06/10/21 1810 84     Resp 06/10/21 1810  16     Temp 06/10/21 1810 98.9 F (37.2 C)     Temp Source 06/10/21 1810 Oral     SpO2 06/10/21 1810 100 %     Weight 06/10/21 1804 150 lb (68 kg)     Height 06/10/21 1804 5\' 1"  (1.549 m)     Head Circumference --      Peak Flow --      Pain Score 06/10/21 1804 10     Pain Loc --      Pain Edu? --      Excl. in GC? --     Constitutional: Alert and oriented. Eyes: Conjunctivae are normal. Head: Atraumatic. Nose: No congestion/rhinnorhea. Mouth/Throat: Mucous membranes are moist. Neck: Normal ROM Cardiovascular: Normal rate, regular rhythm. Grossly normal heart sounds.  2+ radial pulses bilaterally. Respiratory: Normal respiratory effort.  No retractions. Lungs CTAB.  No chest wall tenderness to palpation. Gastrointestinal: Soft and tender to palpation in the epigastrium and right upper quadrant with voluntary guarding.  No distention. Genitourinary: deferred Musculoskeletal: No lower extremity tenderness nor edema. Neurologic:  Normal speech and language. No gross focal neurologic deficits are appreciated. Skin:  Skin is warm, dry and intact. No rash noted. Psychiatric: Mood and affect are normal. Speech and behavior are normal.  ____________________________________________   LABS (all labs ordered are listed, but only abnormal results are displayed)  Labs Reviewed  COMPREHENSIVE METABOLIC PANEL - Abnormal; Notable for the following components:      Result Value   Glucose, Bld 122 (*)    Total Protein 8.3 (*)    AST 349 (*)    ALT 279 (*)    Total Bilirubin 2.0 (*)    All other components within normal limits  CBC WITH DIFFERENTIAL/PLATELET - Abnormal; Notable for the following components:   WBC 12.5 (*)    Neutro Abs 10.7 (*)    All other components within normal limits  URINALYSIS, COMPLETE (UACMP) WITH MICROSCOPIC - Abnormal; Notable for the following components:   Color, Urine AMBER (*)    APPearance CLOUDY (*)    Ketones, ur 5 (*)    Protein, ur 100 (*)    All  other components within normal limits  RESP PANEL BY RT-PCR (FLU A&B, COVID) ARPGX2  LIPASE, BLOOD  PREGNANCY, URINE   ____________________________________________  EKG  ED ECG REPORT I, 06/12/21, the attending physician, personally viewed and interpreted this ECG.   Date: 06/10/2021  EKG Time: 18:07  Rate: 78  Rhythm: normal sinus rhythm  Axis: RAD  Intervals:none  ST&T Change: None   PROCEDURES  Procedure(s) performed (including Critical Care):  Procedures   ____________________________________________   INITIAL IMPRESSION / ASSESSMENT AND PLAN / ED COURSE      30 year old female with past medical history of hypertension, hyperlipidemia, and anxiety presents to the ED with intermittent chest and abdominal pain for the past couple of weeks that became severe and constant yesterday evening.  Pain seems consistent with biliary pathology given her right upper quadrant tenderness with radiation towards her back associated  with nausea and vomiting.  Right upper quadrant ultrasound is consistent with cholecystitis, we will treat symptomatically with IV morphine and Zofran, give a dose of Zosyn and discussed with surgery.  Labs consistent with inflammation in this area given mild transaminitis.  I doubt cardiac or pulmonary etiology, EKG shows no evidence of arrhythmia or ischemia and troponin performed in the [facility] ED is negative.  Case discussed with Dr. Lady Gary of general surgery, who will evaluate the patient.  Anticipate admission for cholecystectomy.      ____________________________________________   FINAL CLINICAL IMPRESSION(S) / ED DIAGNOSES  Final diagnoses:  Right upper quadrant pain  Cholecystitis     ED Discharge Orders     None        Note:  This document was prepared using Dragon voice recognition software and may include unintentional dictation errors.    Chesley Noon, MD 06/10/21 2009

## 2021-06-11 ENCOUNTER — Observation Stay: Payer: BC Managed Care – PPO

## 2021-06-11 ENCOUNTER — Observation Stay: Payer: BC Managed Care – PPO | Admitting: Anesthesiology

## 2021-06-11 ENCOUNTER — Encounter: Payer: Self-pay | Admitting: General Surgery

## 2021-06-11 ENCOUNTER — Encounter
Admission: EM | Disposition: A | Discharge status: Home / Self Care | Payer: Self-pay | Source: Home / Self Care | Attending: Emergency Medicine

## 2021-06-11 DIAGNOSIS — F32A Depression, unspecified: Secondary | ICD-10-CM | POA: Diagnosis not present

## 2021-06-11 DIAGNOSIS — K8020 Calculus of gallbladder without cholecystitis without obstruction: Secondary | ICD-10-CM | POA: Diagnosis not present

## 2021-06-11 DIAGNOSIS — K8000 Calculus of gallbladder with acute cholecystitis without obstruction: Secondary | ICD-10-CM

## 2021-06-11 DIAGNOSIS — R935 Abnormal findings on diagnostic imaging of other abdominal regions, including retroperitoneum: Secondary | ICD-10-CM | POA: Diagnosis not present

## 2021-06-11 DIAGNOSIS — K8012 Calculus of gallbladder with acute and chronic cholecystitis without obstruction: Secondary | ICD-10-CM | POA: Diagnosis not present

## 2021-06-11 DIAGNOSIS — R059 Cough, unspecified: Secondary | ICD-10-CM | POA: Diagnosis not present

## 2021-06-11 DIAGNOSIS — K810 Acute cholecystitis: Secondary | ICD-10-CM | POA: Diagnosis not present

## 2021-06-11 LAB — PHOSPHORUS: Phosphorus: 3.3 mg/dL (ref 2.5–4.6)

## 2021-06-11 LAB — COMPREHENSIVE METABOLIC PANEL
ALT: 299 U/L — ABNORMAL HIGH (ref 0–44)
AST: 290 U/L — ABNORMAL HIGH (ref 15–41)
Albumin: 3.7 g/dL (ref 3.5–5.0)
Alkaline Phosphatase: 101 U/L (ref 38–126)
Anion gap: 8 (ref 5–15)
BUN: 8 mg/dL (ref 6–20)
CO2: 26 mmol/L (ref 22–32)
Calcium: 8.8 mg/dL — ABNORMAL LOW (ref 8.9–10.3)
Chloride: 103 mmol/L (ref 98–111)
Creatinine, Ser: 0.65 mg/dL (ref 0.44–1.00)
GFR, Estimated: >60 mL/min (ref >60)
Glucose, Bld: 107 mg/dL — ABNORMAL HIGH (ref 70–99)
Potassium: 4 mmol/L (ref 3.5–5.1)
Sodium: 137 mmol/L (ref 135–145)
Total Bilirubin: 3.1 mg/dL — ABNORMAL HIGH (ref 0.3–1.2)
Total Protein: 7 g/dL (ref 6.5–8.1)

## 2021-06-11 LAB — CBC
HCT: 36.6 % (ref 36.0–46.0)
Hemoglobin: 12 g/dL (ref 12.0–15.0)
MCH: 29.1 pg (ref 26.0–34.0)
MCHC: 32.8 g/dL (ref 30.0–36.0)
MCV: 88.8 fL (ref 80.0–100.0)
Platelets: 268 10*3/uL (ref 150–400)
RBC: 4.12 MIL/uL (ref 3.87–5.11)
RDW: 13.6 % (ref 11.5–15.5)
WBC: 9.9 10*3/uL (ref 4.0–10.5)
nRBC: 0 % (ref 0.0–0.2)

## 2021-06-11 LAB — HIV ANTIBODY (ROUTINE TESTING W REFLEX): HIV Screen 4th Generation wRfx: NONREACTIVE

## 2021-06-11 LAB — MAGNESIUM: Magnesium: 2.1 mg/dL (ref 1.7–2.4)

## 2021-06-11 SURGERY — CHOLECYSTECTOMY, ROBOT-ASSISTED, LAPAROSCOPIC
Anesthesia: General

## 2021-06-11 MED ORDER — PROMETHAZINE HCL 25 MG/ML IJ SOLN: 6.2500 mg | INTRAMUSCULAR | Status: DC | PRN

## 2021-06-11 MED ORDER — PROPOFOL 500 MG/50ML IV EMUL
INTRAVENOUS | Status: AC
  Filled 2021-06-11: qty 50

## 2021-06-11 MED ORDER — FENTANYL CITRATE (PF) 100 MCG/2ML IJ SOLN
INTRAMUSCULAR | Status: AC
  Administered 2021-06-11: 25 ug via INTRAVENOUS
  Filled 2021-06-11: qty 2

## 2021-06-11 MED ORDER — ONDANSETRON HCL 4 MG/2ML IJ SOLN
INTRAMUSCULAR | Status: AC
  Filled 2021-06-11: qty 2

## 2021-06-11 MED ORDER — OXYCODONE HCL 5 MG PO TABS: 5.0000 mg | ORAL_TABLET | Freq: Once | ORAL | Status: DC | PRN

## 2021-06-11 MED ORDER — ROCURONIUM BROMIDE 100 MG/10ML IV SOLN
INTRAVENOUS | Status: DC | PRN
  Administered 2021-06-11: 10 mg via INTRAVENOUS
  Administered 2021-06-11: 5 mg via INTRAVENOUS
  Administered 2021-06-11: 45 mg via INTRAVENOUS

## 2021-06-11 MED ORDER — ACETAMINOPHEN 10 MG/ML IV SOLN
INTRAVENOUS | Status: DC | PRN
  Administered 2021-06-11: 1000 mg via INTRAVENOUS

## 2021-06-11 MED ORDER — FENTANYL CITRATE (PF) 100 MCG/2ML IJ SOLN: 25.0000 ug | INTRAMUSCULAR | Status: DC | PRN

## 2021-06-11 MED ORDER — LACTATED RINGERS IV SOLN: INTRAVENOUS | Status: DC | PRN

## 2021-06-11 MED ORDER — BUPIVACAINE HCL (PF) 0.25 % IJ SOLN
INTRAMUSCULAR | Status: AC
  Filled 2021-06-11: qty 30

## 2021-06-11 MED ORDER — PROPOFOL 500 MG/50ML IV EMUL
INTRAVENOUS | Status: DC | PRN
  Administered 2021-06-11: 140 ug/kg/min via INTRAVENOUS

## 2021-06-11 MED ORDER — DEXAMETHASONE SODIUM PHOSPHATE 10 MG/ML IJ SOLN
INTRAMUSCULAR | Status: AC
  Filled 2021-06-11: qty 1

## 2021-06-11 MED ORDER — FENTANYL CITRATE (PF) 100 MCG/2ML IJ SOLN
INTRAMUSCULAR | Status: DC | PRN
  Administered 2021-06-11 (×2): 75 ug via INTRAVENOUS

## 2021-06-11 MED ORDER — KETOROLAC TROMETHAMINE 30 MG/ML IJ SOLN
INTRAMUSCULAR | Status: AC
  Filled 2021-06-11: qty 1

## 2021-06-11 MED ORDER — 0.9 % SODIUM CHLORIDE (POUR BTL) OPTIME
TOPICAL | Status: DC | PRN
  Administered 2021-06-11: 200 mL

## 2021-06-11 MED ORDER — ACETAMINOPHEN 10 MG/ML IV SOLN
INTRAVENOUS | Status: AC
  Filled 2021-06-11: qty 100

## 2021-06-11 MED ORDER — FENTANYL CITRATE (PF) 250 MCG/5ML IJ SOLN
INTRAMUSCULAR | Status: AC
  Filled 2021-06-11: qty 5

## 2021-06-11 MED ORDER — MIDAZOLAM HCL 2 MG/2ML IJ SOLN
INTRAMUSCULAR | Status: DC | PRN
  Administered 2021-06-11: 2 mg via INTRAVENOUS

## 2021-06-11 MED ORDER — SUCCINYLCHOLINE CHLORIDE 200 MG/10ML IV SOSY
PREFILLED_SYRINGE | INTRAVENOUS | Status: DC | PRN
  Administered 2021-06-11: 120 mg via INTRAVENOUS

## 2021-06-11 MED ORDER — SUGAMMADEX SODIUM 200 MG/2ML IV SOLN
INTRAVENOUS | Status: DC | PRN
  Administered 2021-06-11: 150 mg via INTRAVENOUS

## 2021-06-11 MED ORDER — PROMETHAZINE HCL 25 MG/ML IJ SOLN
INTRAMUSCULAR | Status: AC
  Filled 2021-06-11: qty 1

## 2021-06-11 MED ORDER — KETOROLAC TROMETHAMINE 30 MG/ML IJ SOLN
INTRAMUSCULAR | Status: DC | PRN
  Administered 2021-06-11: 30 mg via INTRAVENOUS

## 2021-06-11 MED ORDER — PROPOFOL 10 MG/ML IV BOLUS
INTRAVENOUS | Status: DC | PRN
  Administered 2021-06-11: 170 mg via INTRAVENOUS

## 2021-06-11 MED ORDER — ONDANSETRON HCL 4 MG/2ML IJ SOLN
INTRAMUSCULAR | Status: AC
  Administered 2021-06-11: 4 mg via INTRAVENOUS
  Filled 2021-06-11: qty 2

## 2021-06-11 MED ORDER — PROMETHAZINE HCL 25 MG/ML IJ SOLN
INTRAMUSCULAR | Status: DC | PRN
  Administered 2021-06-11: 6.25 mg via INTRAVENOUS

## 2021-06-11 MED ORDER — LIDOCAINE HCL (CARDIAC) PF 100 MG/5ML IV SOSY
PREFILLED_SYRINGE | INTRAVENOUS | Status: DC | PRN
Start: 2021-06-11 — End: 2021-06-11
  Administered 2021-06-11: 80 mg via INTRAVENOUS

## 2021-06-11 MED ORDER — KETAMINE HCL 50 MG/ML IJ SOLN
INTRAMUSCULAR | Status: AC
  Filled 2021-06-11: qty 1

## 2021-06-11 MED ORDER — SODIUM CHLORIDE 0.9 % IV SOLN
25.0000 mg | Freq: Four times a day (QID) | INTRAVENOUS | Status: DC | PRN
  Filled 2021-06-11: qty 1

## 2021-06-11 MED ORDER — KETAMINE HCL 10 MG/ML IJ SOLN
INTRAMUSCULAR | Status: DC | PRN
  Administered 2021-06-11: 50 mg via INTRAVENOUS

## 2021-06-11 MED ORDER — INDOCYANINE GREEN 25 MG IV SOLR
7.5000 mg | Freq: Once | INTRAVENOUS | Status: AC
  Administered 2021-06-11: 7.5 mg via INTRAVENOUS
  Filled 2021-06-11: qty 3

## 2021-06-11 MED ORDER — LIDOCAINE-EPINEPHRINE 1 %-1:100000 IJ SOLN
INTRAMUSCULAR | Status: AC
  Filled 2021-06-11: qty 1

## 2021-06-11 MED ORDER — OXYCODONE HCL 5 MG/5ML PO SOLN: 5.0000 mg | Freq: Once | ORAL | Status: DC | PRN

## 2021-06-11 MED ORDER — DEXAMETHASONE SODIUM PHOSPHATE 10 MG/ML IJ SOLN
INTRAMUSCULAR | Status: DC | PRN
  Administered 2021-06-11: 10 mg via INTRAVENOUS

## 2021-06-11 MED ORDER — LIDOCAINE-EPINEPHRINE 1 %-1:100000 IJ SOLN
INTRAMUSCULAR | Status: DC | PRN
  Administered 2021-06-11: 15 mL via INTRAMUSCULAR

## 2021-06-11 MED ORDER — MIDAZOLAM HCL 2 MG/2ML IJ SOLN
INTRAMUSCULAR | Status: AC
  Filled 2021-06-11: qty 2

## 2021-06-11 MED ORDER — ONDANSETRON HCL 4 MG/2ML IJ SOLN: 4.0000 mg | Freq: Once | INTRAMUSCULAR | Status: AC

## 2021-06-11 MED ORDER — GADOBUTROL 1 MMOL/ML IV SOLN
7.0000 mL | Freq: Once | INTRAVENOUS | Status: AC | PRN
  Administered 2021-06-11: 7 mL via INTRAVENOUS

## 2021-06-11 SURGICAL SUPPLY — 59 items
BAG INFUSER PRESSURE 100CC (MISCELLANEOUS) ×3 IMPLANT
BLADE SURG SZ11 CARB STEEL (BLADE) ×3 IMPLANT
CANISTER SUCT 1200ML W/VALVE (MISCELLANEOUS) ×3 IMPLANT
CANNULA REDUC XI 12-8 STAPL (CANNULA) ×1
CANNULA REDUCER 12-8 DVNC XI (CANNULA) ×2 IMPLANT
CHLORAPREP W/TINT 26 (MISCELLANEOUS) ×3 IMPLANT
CLIP VESOLOCK LG 6/CT PURPLE (CLIP) ×3 IMPLANT
CLIP VESOLOCK MED LG 6/CT (CLIP) ×3 IMPLANT
COVER TIP SHEARS 8 DVNC (MISCELLANEOUS) ×2 IMPLANT
COVER TIP SHEARS 8MM DA VINCI (MISCELLANEOUS) ×1
DECANTER SPIKE VIAL GLASS SM (MISCELLANEOUS) IMPLANT
DEFOGGER SCOPE WARMER CLEARIFY (MISCELLANEOUS) ×3 IMPLANT
DERMABOND ADVANCED (GAUZE/BANDAGES/DRESSINGS) ×1
DERMABOND ADVANCED .7 DNX12 (GAUZE/BANDAGES/DRESSINGS) ×2 IMPLANT
DRAPE ARM DVNC X/XI (DISPOSABLE) ×8 IMPLANT
DRAPE COLUMN DVNC XI (DISPOSABLE) ×2 IMPLANT
DRAPE DA VINCI XI ARM (DISPOSABLE) ×4
DRAPE DA VINCI XI COLUMN (DISPOSABLE) ×1
ELECT CAUTERY BLADE TIP 2.5 (TIP) ×3
ELECT REM PT RETURN 9FT ADLT (ELECTROSURGICAL) ×3
ELECTRODE CAUTERY BLDE TIP 2.5 (TIP) ×2 IMPLANT
ELECTRODE REM PT RTRN 9FT ADLT (ELECTROSURGICAL) ×2 IMPLANT
GAUZE 4X4 16PLY ~~LOC~~+RFID DBL (SPONGE) ×3 IMPLANT
GLOVE SURG SYN 6.5 ES PF (GLOVE) ×3 IMPLANT
GLOVE SURG UNDER LTX SZ7 (GLOVE) ×6 IMPLANT
GOWN STRL REUS W/ TWL LRG LVL3 (GOWN DISPOSABLE) ×6 IMPLANT
GOWN STRL REUS W/TWL LRG LVL3 (GOWN DISPOSABLE) ×3
GRASPER SUT TROCAR 14GX15 (MISCELLANEOUS) IMPLANT
IRRIGATOR SUCT 8 DISP DVNC XI (IRRIGATION / IRRIGATOR) ×2 IMPLANT
IRRIGATOR SUCTION 8MM XI DISP (IRRIGATION / IRRIGATOR) ×1
IV NS 1000ML (IV SOLUTION) ×1
IV NS 1000ML BAXH (IV SOLUTION) ×2 IMPLANT
KIT PINK PAD W/HEAD ARE REST (MISCELLANEOUS) ×3 IMPLANT
KIT PINK PAD W/HEAD ARM REST (MISCELLANEOUS) ×2 IMPLANT
LABEL OR SOLS (LABEL) ×3 IMPLANT
MANIFOLD NEPTUNE II (INSTRUMENTS) IMPLANT
NEEDLE HYPO 22GX1.5 SAFETY (NEEDLE) ×3 IMPLANT
NEEDLE INSUFFLATION 14GA 120MM (NEEDLE) IMPLANT
NS IRRIG 500ML POUR BTL (IV SOLUTION) ×3 IMPLANT
OBTURATOR OPTICAL STANDARD 8MM (TROCAR) ×1
OBTURATOR OPTICAL STND 8 DVNC (TROCAR) ×2
OBTURATOR OPTICALSTD 8 DVNC (TROCAR) ×2 IMPLANT
PACK LAP CHOLECYSTECTOMY (MISCELLANEOUS) ×3 IMPLANT
PENCIL ELECTRO HAND CTR (MISCELLANEOUS) ×3 IMPLANT
POUCH SPECIMEN RETRIEVAL 10MM (ENDOMECHANICALS) ×3 IMPLANT
SEAL CANN UNIV 5-8 DVNC XI (MISCELLANEOUS) ×6 IMPLANT
SEAL XI 5MM-8MM UNIVERSAL (MISCELLANEOUS) ×3
SET TUBE SMOKE EVAC HIGH FLOW (TUBING) ×3 IMPLANT
SOLUTION ELECTROLUBE (MISCELLANEOUS) ×3 IMPLANT
STAPLER CANNULA SEAL DVNC XI (STAPLE) ×2 IMPLANT
STAPLER CANNULA SEAL XI (STAPLE) ×1
STRIP CLOSURE SKIN 1/2X4 (GAUZE/BANDAGES/DRESSINGS) ×3 IMPLANT
SUT MNCRL 4-0 (SUTURE) ×1
SUT MNCRL 4-0 27XMFL (SUTURE) ×2
SUT VIC AB 3-0 SH 27 (SUTURE)
SUT VIC AB 3-0 SH 27X BRD (SUTURE) IMPLANT
SUT VICRYL 0 AB UR-6 (SUTURE) ×3 IMPLANT
SUTURE MNCRL 4-0 27XMF (SUTURE) ×2 IMPLANT
TROCAR XCEL NON-BLD 5MMX100MML (ENDOMECHANICALS) ×3 IMPLANT

## 2021-06-11 NOTE — Op Note (Signed)
Operative note  Pre-operative Diagnosis: Acute cholecystitis  Post-operative Diagnosis: Same  Procedure: Robot assisted laparoscopic cholecystectomy with indocyanine green intraoperative cholangiography  Surgeon: Duanne Guess, MD  Anesthesia: GETA  Findings: Distended and inflamed gallbladder consistent with acute cholecystitis.  Normal biliary anatomy.  Estimated Blood Loss: 10 cc       Specimens: Gallbladder           Complications: none immediately apparent  Procedure In Detail: The patient was seen again in the holding room. The benefits, complications, treatment options, and expected outcomes were discussed with the patient. The risks of bleeding, infection, recurrence of symptoms, failure to resolve symptoms, bile duct damage, bile duct leak, retained common bile duct stone, bowel injury, any of which could require further surgery and/or ERCP, stent, or papillotomy were reviewed with the patient. The likelihood of improving the patient's symptoms with return to their baseline status is good.  The patient and/or family concurred with the proposed plan, giving informed consent.  The patient was taken to operating room, identified and the procedure verified as laparoscopic cholecystectomy.  A time out was held and the above information confirmed.  Prior to the induction of general anesthesia, antibiotic prophylaxis was administered. VTE prophylaxis was in place. General endotracheal anesthesia was then administered and tolerated well. After the induction, the abdomen was prepped with Chloraprep and draped in the usual sterile fashion. The patient was positioned in the supine position.  Optiview technique was used to enter the abdomen in the left upper quadrant via a standard 5 mm laparoscopic trocar.  Pneumoperitoneum was then created with CO2 and tolerated well without any adverse changes in the patient's vital signs.  A 12 mm robotic trocar along with two 8-mm robotic trocars were  placed under direct vision in the patient's lower abdomen.  The 5 mm laparoscopic trocar was then exchanged for an 8 mm robotic trocar.  All skin incisions were infiltrated with a local anesthetic agent before making the incision and placing the trocars.   The patient was positioned in 15 degrees of reverse Trendelenburg and tilted 10 degrees to the left.  The robot was brought to the surgical field and docked in the standard fashion.  We made sure all the instrumentation was kept in direct view at all times and that there were no collision between the arms.  I scrubbed out and went to the console.  The gallbladder was identified, the fundus grasped and retracted cephalad. Adhesions were lysed bluntly. The infundibulum was grasped and retracted laterally, exposing the peritoneum overlying the triangle of Calot. This was then divided and exposed in a blunt fashion. An extended critical view of the cystic duct and cystic artery was obtained.  The cystic duct was clearly identified and bluntly dissected away from the surrounding tissues, as was the cystic artery.  Using ICG cholangiography we visualized the cystic duct and confirmed that there was no aberrant biliary ductal anatomy nor any evidence of bile duct injury.  Both cystic duct and cystic artery were clipped and divided. The gallbladder was taken from the gallbladder fossa in a retrograde fashion with the electrocautery. Hemostasis was achieved with the electrocautery. Inspection of the right upper quadrant was performed. No bleeding, bile duct injury or leak, or bowel injury was noted.  The gallbladder was then placed in an Endopouch bag. The robotic instruments were removed and robotic arms were undocked in the standard fashion.    I scrubbed back in.  The gallbladder was removed via the 12 mm  trocar site.  Using the PMI and a Carter-Thomason cone, the 12 mm port site was then closed with interrupted 0 Vicryl sutures.  The remaining 8 mm ports  were removed and pneumoperitoneum was released.  Each port site was closed with deep dermal 3-0 Vicryl.  4-0 subcuticular Monocryl was used to close the skin. Dermabond was applied, followed by Steri-Strips.  The patient was then awakened, extubated, and taken to the postanesthesia recovery unit in stable condition.   Sponge, lap, and needle counts were reported to be correct number at closure and at the conclusion of the case.          Duanne Guess, MD FACS

## 2021-06-11 NOTE — H&P (Addendum)
[facility] SURGICAL HISTORY & PHYSICAL (cpt [phone])  HISTORY OF PRESENT ILLNESS (HPI):  30 y.o. female presented to [facility] ED yesterday for abdominal pain. Patient reports a few day history of pain in her RUQ and epigastrium. She described this as a burning pain at first and thought it might be reflux. However, in the last 24 hours, the pain became more severe and constant. Pain seems to be worse with deep inspiration. She has associated nausea and emesis with the pain. Does notice more orange colored urine. She did endorse history of mild post-prandial pain in the past. No fever, chills, cough, CP, SOB, CP, or bowel changes. No history of similar in the past. Only previous abdominal surgery is C-section. Work up in the ED was concerning for leukocytosis to 12.5K, mild hyperbilirubinemia to 2.0, and RUQ [place] was concerning for cholelithiasis and signs of cholecystitis.   General surgery is consulted by emergency medicine physician Dr Chesley Noon, MD for evaluation and management of acute cholecystitis.    PAST MEDICAL HISTORY (PMH):  Past Medical History:  Diagnosis Date   Allergy    Anemia    Anxiety    no meds   Complication of anesthesia    Depression    PONV (postoperative nausea and vomiting)     Reviewed. Otherwise negative.   PAST SURGICAL HISTORY (PSH):  Past Surgical History:  Procedure Laterality Date   CESAREAN SECTION N/A 02/07/2020   Procedure: CESAREAN SECTION;  Surgeon: Conard Novak, MD;  Location: [facility] ORS;  Service: Obstetrics;  Laterality: N/A;   DILATION AND EVACUATION N/A 12/30/2018   Procedure: DILATATION AND EVACUATION;  Surgeon: Vena Austria, MD;  Location: [facility] ORS;  Service: Gynecology;  Laterality: N/A;   WISDOM TOOTH EXTRACTION      Reviewed. Otherwise negative.   MEDICATIONS:  Prior to Admission medications   Medication Sig Start Date End Date Taking? Authorizing Provider  citalopram (CELEXA) 20 MG tablet TAKE 1 TABLET BY MOUTH  EVERY DAY 02/12/21  Yes Schuman, Christanna R, MD  omeprazole (PRILOSEC) 20 MG capsule Take 20 mg by mouth daily. 05/20/21  Yes [provider]  promethazine (PHENERGAN) 25 MG tablet Take 25 mg by mouth every 6 (six) hours as needed. 05/20/21  Yes [provider]     ALLERGIES:  Allergies  Allergen Reactions   Abilify [Aripiprazole] Other (See Comments)    Oculogyric crisis --eyes rolled in back of head   Nuvigil [Armodafinil] Swelling    Tongue and throat swelling     SOCIAL HISTORY:  Social History   Socioeconomic History   Marital status: Married    Spouse name: Harrold Donath   Number of children: Not on file   Years of education: Not on file   Highest education level: Not on file  Occupational History   Not on file  Tobacco Use   Smoking status: Never   Smokeless tobacco: Never  Vaping Use   Vaping Use: Never used  Substance and Sexual Activity   Alcohol use: Yes    Alcohol/week: 7.0 standard drinks    Types: 1 Cans of beer, 6 Shots of liquor per week   Drug use: Never   Sexual activity: Yes    Birth control/protection: None  Other Topics Concern   Not on file  Social History Narrative   Not on file   Social Determinants of Health   Financial Resource Strain: Not on file  Food Insecurity: Not on file  Transportation Needs: Not on file  Physical  Activity: Not on file  Stress: Not on file  Social Connections: Not on file  Intimate Partner Violence: Not on file     FAMILY HISTORY:  Family History  Problem Relation Age of Onset   Hypertension Mother    Healthy Father     Otherwise negative.   REVIEW OF SYSTEMS:  Review of Systems  Constitutional:  Negative for chills and fever.  HENT:  Negative for congestion and sore throat.   Respiratory:  Negative for cough and shortness of breath.   Cardiovascular:  Negative for chest pain and palpitations.  Gastrointestinal:  Positive for abdominal pain, nausea and vomiting. Negative for constipation and  diarrhea.  Genitourinary:  Negative for dysuria and urgency.  All other systems reviewed and are negative.  VITAL SIGNS:  Temp:  [97.9 F (36.6 C)-98.9 F (37.2 C)] 97.9 F (36.6 C) (08/16 0326) Pulse Rate:  [80-91] 82 (08/16 0326) Resp:  [16-18] 16 (08/16 0326) BP: (112-120)/(66-80) 115/80 (08/16 0326) SpO2:  [96 %-100 %] 98 % (08/16 0326) Weight:  [68 kg] 68 kg (08/15 1804)     Height: 5\' 1"  (154.9 cm) Weight: 68 kg BMI (Calculated): 28.36   PHYSICAL EXAM:  Physical Exam Vitals and nursing note reviewed.  Constitutional:      General: She is not in acute distress.    Appearance: Normal appearance. She is not ill-appearing.  HENT:     Head: Normocephalic and atraumatic.  Eyes:     General: Scleral icterus (Mild) present.     Extraocular Movements: Extraocular movements intact.  Cardiovascular:     Rate and Rhythm: Normal rate.     Pulses: Normal pulses.  Pulmonary:     Effort: Pulmonary effort is normal. No respiratory distress.  Abdominal:     General: There is no distension.     Palpations: Abdomen is soft.     Tenderness: There is abdominal tenderness in the right upper quadrant and epigastric area. There is no guarding or rebound.     Comments: Abdomen is soft, she is tender with deep palpation in the RUQ/Epigastrium, non-distended   Genitourinary:    Comments: Deferred Skin:    General: Skin is warm and dry.     Coloration: Skin is not pale.     Findings: No erythema.  Neurological:     General: No focal deficit present.     Mental Status: She is alert and oriented to person, place, and time.  Psychiatric:        Mood and Affect: Mood normal.        Behavior: Behavior normal.    INTAKE/OUTPUT:  This shift: No intake/output data recorded.  Last 2 shifts: @IOLAST2SHIFTS @  Labs:  CBC Latest Ref Rng & Units 06/11/2021 06/10/2021 05/23/2021  WBC 4.0 - 10.5 K/uL 9.9 12.5(H) 7.3  Hemoglobin 12.0 - 15.0 g/dL 06/12/2021 05/25/2021 62.9  Hematocrit 36.0 - 46.0 % 36.6 37.7 39.9   Platelets 150 - 400 K/uL 268 338 285   CMP Latest Ref Rng & Units 06/11/2021 06/10/2021 08/15/2020  Glucose 70 - 99 mg/dL 06/12/2021) 08/17/2020) 84  BUN 6 - 20 mg/dL 8 10 10   Creatinine 0.44 - 1.00 mg/dL 244(W 102(V  Sodium 135 - 145 mmol/L 137 138 138  Potassium 3.5 - 5.1 mmol/L 4.0 4.8 4.9  Chloride 98 - 111 mmol/L 103 102 103  CO2 22 - 32 mmol/L 26 25 23   Calcium 8.9 - 10.3 mg/dL 2.53) 9.5 9.6  Total Protein 6.5 - 8.1 g/dL 7.0 6.64)  7.5  Total Bilirubin 0.3 - 1.2 mg/dL 3.1(H) 2.0(H) 0.3  Alkaline Phos 38 - 126 U/L 101 107 86  AST 15 - 41 U/L 290(H) 349(H) 16  ALT 0 - 44 U/L 299(H) 279(H) 14     Imaging studies:   RUQ [place] (06/10/2021) personally reviewed showing numerous gallstones and gallbladder wall changes concerning for cholecystitis, and radiologist report reviewed below:  IMPRESSION: 1. Cholelithiasis with additional findings consistent with acute cholecystitis. 2. Fatty liver.   Assessment/Plan: (ICD-10's: K24.9) 30 y.o. female with abdominal p[ain/nausea/emesis and hyperbilirubinemia concerning for cholecystitis with possible choledocholithiasis pending MRCP   - Given her worsening hyperbilirubinemia, she will need MRCP to evaluate for choledocholithiasis   - at some point this admission, she will benefit from robotic assisted laparoscopic cholecystectomy, hopefully this afternoon, with Dr Lady Gary pending OR/Anesthesia availability  - NPO + IVF resuscitation - Monitor abdominal examination - Continue IV Abx (Zosyn) - Pain control prn; antiemetics prn   - DVT prophylaxis; hold for OR  All of the above findings and recommendations were discussed with the patient and her husband at bedside, and all of their questions were answered to their expressed satisfaction.  -- Lynden Oxford, PA-C [facility] 06/11/2021, 7:24 AM [phone] M-F: 7am - 4pm  MRCP negative for choledocholithiasis.  Will proceed with cholecystectomy and monitor labs post-op for  resolution.I saw and evaluated the patient.  I agree with the above documentation, exam, and plan, which I have edited where appropriate. Duanne Guess  3:11 PM

## 2021-06-11 NOTE — Anesthesia Postprocedure Evaluation (Signed)
Anesthesia Post Note  Patient: AHNNA DUNGAN  Procedure(s) Performed: XI ROBOTIC ASSISTED LAPAROSCOPIC CHOLECYSTECTOMY INDOCYANINE GREEN FLUORESCENCE IMAGING (ICG)  Patient location during evaluation: PACU Anesthesia Type: General Level of consciousness: awake and alert Pain management: pain level controlled Vital Signs Assessment: post-procedure vital signs reviewed and stable Respiratory status: spontaneous breathing, nonlabored ventilation, respiratory function stable and patient connected to nasal cannula oxygen Cardiovascular status: blood pressure returned to baseline and stable Postop Assessment: no apparent nausea or vomiting Anesthetic complications: no   No notable events documented.   Last Vitals:  Vitals:   06/11/21 1900 06/11/21 1916  BP: 123/76 129/87  Pulse: 98 100  Resp: (!) 21 20  Temp:    SpO2: 96% 97%    Last Pain:  Vitals:   06/11/21 1916  TempSrc:   PainSc: 5                  Cleda Mccreedy Amariana Mirando

## 2021-06-11 NOTE — Progress Notes (Signed)
Patient to OR in Bed via Orderly.

## 2021-06-11 NOTE — Transfer of Care (Signed)
Immediate Anesthesia Transfer of Care Note  Patient: Natalie Welch  Procedure(s) Performed: XI ROBOTIC ASSISTED LAPAROSCOPIC CHOLECYSTECTOMY INDOCYANINE GREEN FLUORESCENCE IMAGING (ICG)  Patient Location: PACU  Anesthesia Type:General  Level of Consciousness: awake, drowsy and patient cooperative  Airway & Oxygen Therapy: Patient Spontanous Breathing and Patient connected to face mask oxygen  Post-op Assessment: Report given to RN and Post -op Vital signs reviewed and stable  Post vital signs: Reviewed and stable  Last Vitals:  Vitals Value Taken Time  BP 118/78 06/11/21 1831  Temp 37.1 C 06/11/21 1830  Pulse 95 06/11/21 1836  Resp 21 06/11/21 1836  SpO2 100 % 06/11/21 1836  Vitals shown include unvalidated device data.  Last Pain:  Vitals:   06/11/21 1451  TempSrc: Oral  PainSc: 4          Complications: No notable events documented.

## 2021-06-11 NOTE — Anesthesia Procedure Notes (Signed)
Procedure Name: Intubation Date/Time: 06/11/2021 4:41 PM Performed by: Katherine Basset, CRNA Pre-anesthesia Checklist: Patient identified, Emergency Drugs available, Suction available and Patient being monitored Patient Re-evaluated:Patient Re-evaluated prior to induction Oxygen Delivery Method: Circle system utilized Preoxygenation: Pre-oxygenation with 100% oxygen Induction Type: IV induction, Rapid sequence and Cricoid Pressure applied Ventilation: Mask ventilation without difficulty Laryngoscope Size: Miller and 2 Grade View: Grade I Tube type: Oral Tube size: 7.0 mm Number of attempts: 1 Airway Equipment and Method: Stylet, Oral airway and Bite block Placement Confirmation: ETT inserted through vocal cords under direct vision, positive ETCO2 and breath sounds checked- equal and bilateral Secured at: 21 cm Tube secured with: Tape Dental Injury: Teeth and Oropharynx as per pre-operative assessment

## 2021-06-11 NOTE — Anesthesia Preprocedure Evaluation (Signed)
Anesthesia Evaluation  Patient identified by MRN, date of birth, ID band Patient awake    Reviewed: Allergy & Precautions, NPO status , Patient's Chart, lab work & pertinent test results  History of Anesthesia Complications (+) PONV and history of anesthetic complications  Airway Mallampati: III  TM Distance: <3 FB Neck ROM: full    Dental  (+) Chipped   Pulmonary neg pulmonary ROS, neg shortness of breath,    Pulmonary exam normal        Cardiovascular Exercise Tolerance: Good hypertension, (-) angina(-) Past MI and (-) DOE Normal cardiovascular exam     Neuro/Psych PSYCHIATRIC DISORDERS negative neurological ROS     GI/Hepatic Neg liver ROS, GERD  ,  Endo/Other  negative endocrine ROS  Renal/GU      Musculoskeletal   Abdominal   Peds  Hematology negative hematology ROS (+)   Anesthesia Other Findings Nauseated in PreOp  Past Medical History: No date: Allergy No date: Anemia No date: Anxiety     Comment:  no meds No date: Complication of anesthesia No date: Depression No date: PONV (postoperative nausea and vomiting)  Past Surgical History: 02/07/2020: CESAREAN SECTION; N/A     Comment:  Procedure: CESAREAN SECTION;  Surgeon: Conard Novak, MD;  Location: [facility] ORS;  Service: Obstetrics;                Laterality: N/A; 12/30/2018: DILATION AND EVACUATION; N/A     Comment:  Procedure: DILATATION AND EVACUATION;  Surgeon:               Vena Austria, MD;  Location: [facility] ORS;  Service:               Gynecology;  Laterality: N/A; No date: WISDOM TOOTH EXTRACTION  BMI    Body Mass Index: 28.34 kg/m      Reproductive/Obstetrics negative OB ROS                             Anesthesia Physical Anesthesia Plan  ASA: 3  Anesthesia Plan: General ETT, Rapid Sequence and Cricoid Pressure   Post-op Pain Management:    Induction: Intravenous  PONV Risk  Score and Plan: Ondansetron, Dexamethasone, Midazolam and Treatment may vary due to age or medical condition  Airway Management Planned: Oral ETT  Additional Equipment:   Intra-op Plan:   Post-operative Plan: Extubation in OR  Informed Consent: I have reviewed the patients History and Physical, chart, labs and discussed the procedure including the risks, benefits and alternatives for the proposed anesthesia with the patient or authorized representative who has indicated his/her understanding and acceptance.     Dental Advisory Given  Plan Discussed with: Anesthesiologist, CRNA and Surgeon  Anesthesia Plan Comments: (Patient consented for risks of anesthesia including but not limited to:  - adverse reactions to medications - damage to eyes, teeth, lips or other oral mucosa - nerve damage due to positioning  - sore throat or hoarseness - Damage to heart, brain, nerves, lungs, other parts of body or loss of life  Patient voiced understanding.)        Anesthesia Quick Evaluation

## 2021-06-11 NOTE — Progress Notes (Signed)
Patient in wheelchair to MRI via Orderly.

## 2021-06-12 LAB — COMPREHENSIVE METABOLIC PANEL
ALT: 295 U/L — ABNORMAL HIGH (ref 0–44)
AST: 202 U/L — ABNORMAL HIGH (ref 15–41)
Albumin: 4.2 g/dL (ref 3.5–5.0)
Alkaline Phosphatase: 116 U/L (ref 38–126)
Anion gap: 7 (ref 5–15)
BUN: 8 mg/dL (ref 6–20)
CO2: 23 mmol/L (ref 22–32)
Calcium: 8.9 mg/dL (ref 8.9–10.3)
Chloride: 103 mmol/L (ref 98–111)
Creatinine, Ser: 0.78 mg/dL (ref 0.44–1.00)
GFR, Estimated: >60 mL/min (ref >60)
Glucose, Bld: 134 mg/dL — ABNORMAL HIGH (ref 70–99)
Potassium: 4.8 mmol/L (ref 3.5–5.1)
Sodium: 133 mmol/L — ABNORMAL LOW (ref 135–145)
Total Bilirubin: 2.9 mg/dL — ABNORMAL HIGH (ref 0.3–1.2)
Total Protein: 7.4 g/dL (ref 6.5–8.1)

## 2021-06-12 LAB — CBC
HCT: 37 % (ref 36.0–46.0)
Hemoglobin: 12.1 g/dL (ref 12.0–15.0)
MCH: 28.3 pg (ref 26.0–34.0)
MCHC: 32.7 g/dL (ref 30.0–36.0)
MCV: 86.4 fL (ref 80.0–100.0)
Platelets: 293 10*3/uL (ref 150–400)
RBC: 4.28 MIL/uL (ref 3.87–5.11)
RDW: 13.7 % (ref 11.5–15.5)
WBC: 10.4 10*3/uL (ref 4.0–10.5)
nRBC: 0 % (ref 0.0–0.2)

## 2021-06-12 LAB — PHOSPHORUS: Phosphorus: 2.5 mg/dL (ref 2.5–4.6)

## 2021-06-12 LAB — MAGNESIUM: Magnesium: 2.1 mg/dL (ref 1.7–2.4)

## 2021-06-12 MED ORDER — OXYCODONE HCL 5 MG PO TABS
5.0000 mg | ORAL_TABLET | ORAL | Status: DC | PRN
  Administered 2021-06-12: 5 mg via ORAL
  Filled 2021-06-12: qty 1

## 2021-06-12 MED ORDER — OXYCODONE HCL 5 MG PO TABS: 5.0000 mg | ORAL_TABLET | ORAL | 0 refills | Status: DC | PRN

## 2021-06-12 MED ORDER — ONDANSETRON 4 MG PO TBDP: 4.0000 mg | ORAL_TABLET | Freq: Three times a day (TID) | ORAL | 1 refills | Status: DC | PRN

## 2021-06-12 MED ORDER — IBUPROFEN 600 MG PO TABS: 600.0000 mg | ORAL_TABLET | Freq: Four times a day (QID) | ORAL | 0 refills | Status: DC | PRN

## 2021-06-12 NOTE — Discharge Summary (Addendum)
[facility] SURGICAL DISCHARGE SUMMARY  Patient ID: Natalie Welch MRN: [id number] DOB/AGE: 1990-11-26 30 y.o.  Admit date: 06/10/2021 Discharge date: 06/12/2021  Discharge Diagnoses Patient Active Problem List   Diagnosis Date Noted   Acute calculous cholecystitis 06/10/2021    Consultants None  Procedures 06/11/2021:  Robotic assisted laparoscopic cholecystectomy  HPI: Natalie Welch is a 30 y.o. female presented to [facility] ED yesterday for abdominal pain. Patient reports a few day history of pain in her RUQ and epigastrium. She described this as a burning pain at first and thought it might be reflux. However, in the last 24 hours, the pain became more severe and constant. Pain seems to be worse with deep inspiration. She has associated nausea and emesis with the pain. Does notice more orange colored urine. She did endorse history of mild post-prandial pain in the past. No fever, chills, cough, CP, SOB, CP, or bowel changes. No history of similar in the past. Only previous abdominal surgery is C-section. Work up in the ED was concerning for leukocytosis to 12.5K, mild hyperbilirubinemia to 2.0, and RUQ [place] was concerning for cholelithiasis and signs of cholecystitis.  Hospital Course: Informed consent was obtained and documented, and patient underwent uneventful robotic assisted laparoscopic cholecystectomy (Dr Lady Gary, 06/11/2021).  Post-operatively, patient's pain/symptoms improved/resolved and advancement of patient's diet and ambulation were well-tolerated. The remainder of patient's hospital course was essentially unremarkable, and discharge planning was initiated accordingly with patient safely able to be discharged home with appropriate discharge instructions, pain control, and outpatient follow-up after all of her questions were answered to her expressed satisfaction.   Discharge Condition:    Physical Examination:  Constitutional: Well appearing female,  NAD Pulmonary: Normal effort, no respiratory distress Gastrointestinal: Soft, non-tender, non-distended, no rebound/guarding Skin: Laparoscopic incisions are CDI with steri-strips, no erythema or drainage    Allergies as of 06/12/2021       Reactions   Abilify [aripiprazole] Other (See Comments)   Oculogyric crisis --eyes rolled in back of head   Nuvigil [armodafinil] Swelling   Tongue and throat swelling        Medication List     TAKE these medications    citalopram 20 MG tablet Commonly known as: CELEXA TAKE 1 TABLET BY MOUTH EVERY DAY   ibuprofen 600 MG tablet Commonly known as: ADVIL Take 1 tablet (600 mg total) by mouth every 6 (six) hours as needed for mild pain.   omeprazole 20 MG capsule Commonly known as: PRILOSEC Take 20 mg by mouth daily.   oxyCODONE 5 MG immediate release tablet Commonly known as: Oxy IR/ROXICODONE Take 1 tablet (5 mg total) by mouth every 4 (four) hours as needed for severe pain or breakthrough pain.   promethazine 25 MG tablet Commonly known as: PHENERGAN Take 25 mg by mouth every 6 (six) hours as needed.          Follow-up Information     Donovan Kail, PA-C. Schedule an appointment as soon as possible for a visit in 2 week(s).   Specialty: Physician Assistant Why: s/p laparoscopic cholecystectomy Contact information: [address] [place] [address] [phone]                  Time spent on discharge management including discussion of hospital course, clinical condition, outpatient instructions, prescriptions, and follow up with the patient and members of the medical team: >30 minutes  -- Lynden Oxford , PA-C [facility]  06/12/2021, 8:25 AM [phone] M-F: 7am -  4pm  I saw and evaluated the patient.  I agree with the above documentation, exam, and plan, which I have edited where appropriate. Duanne Guess  9:34 AM

## 2021-06-12 NOTE — Discharge Instructions (Signed)

## 2021-06-12 NOTE — Progress Notes (Signed)
Pt discharged home.  Discharge instructions, prescriptions and follow up appointment given to and reviewed with pt.  Pt verbalized understanding.  Escorted by auxillary. 

## 2021-06-13 LAB — SURGICAL PATHOLOGY

## 2021-06-15 ENCOUNTER — Other Ambulatory Visit: Payer: Self-pay

## 2021-06-15 ENCOUNTER — Encounter: Payer: Self-pay | Admitting: *Deleted

## 2021-06-15 ENCOUNTER — Emergency Department: Payer: BC Managed Care – PPO

## 2021-06-15 ENCOUNTER — Emergency Department
Admission: EM | Admit: 2021-06-15 | Discharge: 2021-06-16 | Disposition: A | Discharge status: Home / Self Care | Payer: BC Managed Care – PPO | Attending: Emergency Medicine | Admitting: Emergency Medicine

## 2021-06-15 DIAGNOSIS — R0789 Other chest pain: Secondary | ICD-10-CM | POA: Diagnosis not present

## 2021-06-15 DIAGNOSIS — R079 Chest pain, unspecified: Secondary | ICD-10-CM | POA: Insufficient documentation

## 2021-06-15 DIAGNOSIS — M549 Dorsalgia, unspecified: Secondary | ICD-10-CM | POA: Insufficient documentation

## 2021-06-15 DIAGNOSIS — Z20822 Contact with and (suspected) exposure to covid-19: Secondary | ICD-10-CM | POA: Diagnosis not present

## 2021-06-15 DIAGNOSIS — Z79899 Other long term (current) drug therapy: Secondary | ICD-10-CM | POA: Diagnosis not present

## 2021-06-15 DIAGNOSIS — K8021 Calculus of gallbladder without cholecystitis with obstruction: Secondary | ICD-10-CM | POA: Diagnosis present

## 2021-06-15 DIAGNOSIS — R17 Unspecified jaundice: Secondary | ICD-10-CM | POA: Diagnosis not present

## 2021-06-15 DIAGNOSIS — K831 Obstruction of bile duct: Secondary | ICD-10-CM | POA: Diagnosis not present

## 2021-06-15 DIAGNOSIS — R109 Unspecified abdominal pain: Secondary | ICD-10-CM | POA: Diagnosis not present

## 2021-06-15 DIAGNOSIS — R1110 Vomiting, unspecified: Secondary | ICD-10-CM | POA: Diagnosis not present

## 2021-06-15 DIAGNOSIS — R1013 Epigastric pain: Secondary | ICD-10-CM | POA: Diagnosis not present

## 2021-06-15 HISTORY — DX: Calculus of gallbladder without cholecystitis with obstruction: K80.21

## 2021-06-15 LAB — CBC WITH DIFFERENTIAL/PLATELET
Abs Immature Granulocytes: 0.02 10*3/uL (ref 0.00–0.07)
Basophils Absolute: 0 10*3/uL (ref 0.0–0.1)
Basophils Relative: 1 %
Eosinophils Absolute: 0.1 10*3/uL (ref 0.0–0.5)
Eosinophils Relative: 2 %
HCT: 40.3 % (ref 36.0–46.0)
Hemoglobin: 13.5 g/dL (ref 12.0–15.0)
Immature Granulocytes: 0 %
Lymphocytes Relative: 14 %
Lymphs Abs: 1.2 10*3/uL (ref 0.7–4.0)
MCH: 29 pg (ref 26.0–34.0)
MCHC: 33.5 g/dL (ref 30.0–36.0)
MCV: 86.7 fL (ref 80.0–100.0)
Monocytes Absolute: 0.8 10*3/uL (ref 0.1–1.0)
Monocytes Relative: 9 %
Neutro Abs: 6.3 10*3/uL (ref 1.7–7.7)
Neutrophils Relative %: 74 %
Platelets: 348 10*3/uL (ref 150–400)
RBC: 4.65 MIL/uL (ref 3.87–5.11)
RDW: 14 % (ref 11.5–15.5)
WBC: 8.4 10*3/uL (ref 4.0–10.5)
nRBC: 0 % (ref 0.0–0.2)

## 2021-06-15 LAB — COMPREHENSIVE METABOLIC PANEL
ALT: 231 U/L — ABNORMAL HIGH (ref 0–44)
AST: 141 U/L — ABNORMAL HIGH (ref 15–41)
Albumin: 4.2 g/dL (ref 3.5–5.0)
Alkaline Phosphatase: 171 U/L — ABNORMAL HIGH (ref 38–126)
Anion gap: 12 (ref 5–15)
BUN: 13 mg/dL (ref 6–20)
CO2: 24 mmol/L (ref 22–32)
Calcium: 9.4 mg/dL (ref 8.9–10.3)
Chloride: 98 mmol/L (ref 98–111)
Creatinine, Ser: 0.65 mg/dL (ref 0.44–1.00)
GFR, Estimated: >60 mL/min (ref >60)
Glucose, Bld: 137 mg/dL — ABNORMAL HIGH (ref 70–99)
Potassium: 3.5 mmol/L (ref 3.5–5.1)
Sodium: 134 mmol/L — ABNORMAL LOW (ref 135–145)
Total Bilirubin: 5.2 mg/dL — ABNORMAL HIGH (ref 0.3–1.2)
Total Protein: 8.1 g/dL (ref 6.5–8.1)

## 2021-06-15 LAB — PROTIME-INR
INR: 0.9 (ref 0.8–1.2)
Prothrombin Time: 11.9 seconds (ref 11.4–15.2)

## 2021-06-15 LAB — URINALYSIS, COMPLETE (UACMP) WITH MICROSCOPIC
Bacteria, UA: NONE SEEN
Glucose, UA: NEGATIVE mg/dL
Hgb urine dipstick: NEGATIVE
Ketones, ur: >160 mg/dL — AB
Leukocytes,Ua: NEGATIVE
Nitrite: NEGATIVE
Protein, ur: 30 mg/dL — AB
Specific Gravity, Urine: 1.025 (ref 1.005–1.030)
pH: 6 (ref 5.0–8.0)

## 2021-06-15 LAB — HCG, QUANTITATIVE, PREGNANCY: hCG, Beta Chain, Quant, S: 1 m[IU]/mL (ref <5)

## 2021-06-15 LAB — RESP PANEL BY RT-PCR (FLU A&B, COVID) ARPGX2
Influenza A by PCR: NEGATIVE
Influenza B by PCR: NEGATIVE
SARS Coronavirus 2 by RT PCR: NEGATIVE

## 2021-06-15 LAB — D-DIMER, QUANTITATIVE: D-Dimer, Quant: 2.31 ug/mL-FEU — ABNORMAL HIGH (ref 0.00–0.50)

## 2021-06-15 LAB — ACETAMINOPHEN LEVEL: Acetaminophen (Tylenol), Serum: <10 ug/mL — ABNORMAL LOW (ref 10–30)

## 2021-06-15 LAB — LIPASE, BLOOD: Lipase: 39 U/L (ref 11–51)

## 2021-06-15 MED ORDER — LACTATED RINGERS IV SOLN: INTRAVENOUS | Status: DC

## 2021-06-15 MED ORDER — ONDANSETRON HCL 4 MG/2ML IJ SOLN
4.0000 mg | Freq: Once | INTRAMUSCULAR | Status: AC
  Administered 2021-06-15: 4 mg via INTRAVENOUS
  Filled 2021-06-15: qty 2

## 2021-06-15 MED ORDER — HYDROMORPHONE HCL 1 MG/ML IJ SOLN
1.0000 mg | INTRAMUSCULAR | Status: DC | PRN
Start: 2021-06-15 — End: 2021-06-16
  Administered 2021-06-15 – 2021-06-16 (×2): 1 mg via INTRAVENOUS
  Filled 2021-06-15 (×2): qty 1

## 2021-06-15 MED ORDER — FENTANYL CITRATE (PF) 100 MCG/2ML IJ SOLN: 50.0000 ug | INTRAMUSCULAR | Status: DC | PRN

## 2021-06-15 MED ORDER — IOHEXOL 350 MG/ML SOLN
100.0000 mL | Freq: Once | INTRAVENOUS | Status: AC | PRN
  Administered 2021-06-15: 80 mL via INTRAVENOUS
  Filled 2021-06-15: qty 100

## 2021-06-15 MED ORDER — LACTATED RINGERS IV BOLUS
1000.0000 mL | Freq: Once | INTRAVENOUS | Status: AC
  Administered 2021-06-15: 1000 mL via INTRAVENOUS

## 2021-06-15 MED ORDER — HYDROMORPHONE HCL 1 MG/ML IJ SOLN
1.0000 mg | Freq: Once | INTRAMUSCULAR | Status: AC
  Administered 2021-06-15: 1 mg via INTRAVENOUS
  Filled 2021-06-15: qty 1

## 2021-06-15 MED ORDER — PANTOPRAZOLE SODIUM 40 MG IV SOLR
40.0000 mg | Freq: Once | INTRAVENOUS | Status: AC
  Administered 2021-06-15: 40 mg via INTRAVENOUS
  Filled 2021-06-15: qty 40

## 2021-06-15 MED ORDER — ONDANSETRON HCL 4 MG/2ML IJ SOLN
4.0000 mg | Freq: Four times a day (QID) | INTRAMUSCULAR | Status: DC | PRN
  Administered 2021-06-15: 4 mg via INTRAVENOUS
  Filled 2021-06-15: qty 2

## 2021-06-15 NOTE — ED Provider Notes (Addendum)
[facility] Emergency Department Provider Note  ____________________________________________   Event Date/Time   First MD Initiated Contact with Patient 06/15/21 1550     (approximate)  I have reviewed the triage vital signs and the nursing notes.   HISTORY  Chief Complaint Chest Pain   HPI Natalie Welch is a 30 y.o. female with past medical history of anemia, anxiety and recent diagnosis of acute cholecystitis on 8/15 status post laparoscopic cholecystectomy on 8/16 discharged on 8/17 presents for assessment of some persistent worsening epigastric pain radiating into the chest associate with some nonbloody nonbilious vomiting not responding to ODT Zofran.  Patient states she was initially constipated after leaving the hospital but had some diarrhea in the last 24 hours.  She thinks her eyes of getting more yellow and possibly her skin as well.  She denies any new cough, upper back pain but feels that her epigastric abdominal pain is rating into her mid back.  She denies any blood in her stool or urinary symptoms.  No rash or extremity pain.  No recent injuries or falls.  She denies any significant EtOH use prior history of liver problems.  She states she was prescribed secondary milligrams ibuprofen but is not taking limited amounts of this.         Past Medical History:  Diagnosis Date   Allergy    Anemia    Anxiety    no meds   Complication of anesthesia    Depression    PONV (postoperative nausea and vomiting)     Patient Active Problem List   Diagnosis Date Noted   Acute calculous cholecystitis 06/10/2021   Hyperlipidemia 09/28/2020   Status post cesarean delivery 02/07/2020   Arrest of dilation, delivered, current hospitalization 02/07/2020   Gestational hypertension 02/07/2020   Macrosomia affecting management of mother in third trimester, fetus 1 01/23/2020   Macrosomia affecting management of mother in third trimester 01/17/2020    Polyhydramnios affecting pregnancy in third trimester 12/21/2019   Elevated glucose tolerance test 12/06/2019   Anemia during pregnancy 11/16/2019   Generalized anxiety disorder with panic attacks 08/05/2019   Supervision of normal first pregnancy, antepartum 11/23/2018    Past Surgical History:  Procedure Laterality Date   CESAREAN SECTION N/A 02/07/2020   Procedure: CESAREAN SECTION;  Surgeon: Will Bonnet, MD;  Location: [facility] ORS;  Service: Obstetrics;  Laterality: N/A;   DILATION AND EVACUATION N/A 12/30/2018   Procedure: DILATATION AND EVACUATION;  Surgeon: Malachy Mood, MD;  Location: [facility] ORS;  Service: Gynecology;  Laterality: N/A;   WISDOM TOOTH EXTRACTION      Prior to Admission medications   Medication Sig Start Date End Date Taking? Authorizing Provider  citalopram (CELEXA) 20 MG tablet TAKE 1 TABLET BY MOUTH EVERY DAY 02/12/21   Schuman, Christanna R, MD  ibuprofen (ADVIL) 600 MG tablet Take 1 tablet (600 mg total) by mouth every 6 (six) hours as needed for mild pain. 06/12/21   Tylene Fantasia, PA-C  omeprazole (PRILOSEC) 20 MG capsule Take 20 mg by mouth daily. 05/20/21   [provider]  ondansetron (ZOFRAN ODT) 4 MG disintegrating tablet Take 1 tablet (4 mg total) by mouth every 8 (eight) hours as needed for nausea or vomiting. 06/12/21   Tylene Fantasia, PA-C  oxyCODONE (OXY IR/ROXICODONE) 5 MG immediate release tablet Take 1 tablet (5 mg total) by mouth every 4 (four) hours as needed for severe pain or breakthrough pain. 06/12/21   Tylene Fantasia,  PA-C  promethazine (PHENERGAN) 25 MG tablet Take 25 mg by mouth every 6 (six) hours as needed. 05/20/21   [provider]    Allergies Abilify [aripiprazole] and Nuvigil [armodafinil]  Family History  Problem Relation Age of Onset   Hypertension Mother    Healthy Father     Social History Social History   Tobacco Use   Smoking status: Never   Smokeless tobacco: Never  Vaping Use    Vaping Use: Never used  Substance Use Topics   Alcohol use: Not Currently    Alcohol/week: 7.0 standard drinks    Types: 1 Cans of beer, 6 Shots of liquor per week   Drug use: Never    Review of Systems  Review of Systems  Constitutional:  Negative for chills and fever.  HENT:  Negative for sore throat.   Eyes:  Negative for pain.  Respiratory:  Negative for cough and stridor.   Cardiovascular:  Positive for chest pain.  Gastrointestinal:  Positive for abdominal pain, diarrhea, nausea and vomiting.  Genitourinary:  Negative for dysuria.  Musculoskeletal:  Negative for myalgias.  Skin:  Negative for rash.  Neurological:  Negative for seizures, loss of consciousness and headaches.  Psychiatric/Behavioral:  Negative for suicidal ideas.   All other systems reviewed and are negative.    ____________________________________________   PHYSICAL EXAM:  VITAL SIGNS: ED Triage Vitals  Enc Vitals Group     BP 06/15/21 1520 122/77     Pulse Rate 06/15/21 1520 99     Resp 06/15/21 1520 20     Temp 06/15/21 1520 97.7 F (36.5 C)     Temp Source 06/15/21 1520 Oral     SpO2 06/15/21 1520 95 %     Weight 06/15/21 1523 150 lb (68 kg)     Height 06/15/21 1523 _0  (1.549 m)     Head Circumference --      Peak Flow --      Pain Score 06/15/21 1523 8     Pain Loc --      Pain Edu? --      Excl. in [place]? --    Vitals:   06/15/21 1520  BP: 122/77  Pulse: 99  Resp: 20  Temp: 97.7 F (36.5 C)  SpO2: 95%   Physical Exam Vitals and nursing note reviewed.  Constitutional:      General: She is not in acute distress.    Appearance: She is well-developed. She is ill-appearing.  HENT:     Head: Normocephalic and atraumatic.     Right Ear: External ear normal.     Left Ear: External ear normal.     Nose: Nose normal.     Mouth/Throat:     Mouth: Mucous membranes are dry.  Eyes:     General: Scleral icterus present.     Conjunctiva/sclera: Conjunctivae normal.  Cardiovascular:      Rate and Rhythm: Normal rate and regular rhythm.     Pulses: Normal pulses.     Heart sounds: No murmur heard. Pulmonary:     Effort: Pulmonary effort is normal. No respiratory distress.     Breath sounds: Normal breath sounds.  Abdominal:     Palpations: Abdomen is soft.     Tenderness: There is abdominal tenderness in the right upper quadrant and epigastric area. There is no right CVA tenderness or left CVA tenderness.  Musculoskeletal:     Cervical back: Neck supple.  Skin:    General: Skin is warm  and dry.     Capillary Refill: Capillary refill takes 2 to 3 seconds.     Coloration: Skin is jaundiced.  Neurological:     Mental Status: She is alert and oriented to person, place, and time.  Psychiatric:        Mood and Affect: Mood normal.     ____________________________________________   LABS (all labs ordered are listed, but only abnormal results are displayed)  Labs Reviewed  COMPREHENSIVE METABOLIC PANEL - Abnormal; Notable for the following components:      Result Value   Sodium 134 (*)    Glucose, Bld 137 (*)    AST 141 (*)    ALT 231 (*)    Alkaline Phosphatase 171 (*)    Total Bilirubin 5.2 (*)    All other components within normal limits  D-DIMER, QUANTITATIVE - Abnormal; Notable for the following components:   D-Dimer, Quant 2.31 (*)    All other components within normal limits  URINALYSIS, COMPLETE (UACMP) WITH MICROSCOPIC - Abnormal; Notable for the following components:   Color, Urine AMBER (*)    APPearance CLEAR (*)    Bilirubin Urine LARGE (*)    Ketones, ur >160 (*)    Protein, ur 30 (*)    Crystals PRESENT (*)    All other components within normal limits  RESP PANEL BY RT-PCR (FLU A&B, COVID) ARPGX2  CBC WITH DIFFERENTIAL/PLATELET  HCG, QUANTITATIVE, PREGNANCY  PROTIME-INR  LIPASE, BLOOD  ACETAMINOPHEN LEVEL  HEPATITIS PANEL, ACUTE   ____________________________________________  EKG  Sinus rhythm with ventricular rate of 94, normal  axis, nonspecific ST change in lead III, lead I, aVL, aVF without other clear evidence of acute ischemia or significant arrhythmia.  Very low amplitude in lead III. ____________________________________________  RADIOLOGY  ED MD interpretation: Chest x-ray has no focal consolidation, effusion, edema, pneumothorax or any other clear acute intrathoracic process.  CTA chest abdomen pelvis shows no evidence of PE, pneumonia, pleural effusion but does show evidence of some mild intra and extrahepatic bile duct dilation.  No stone is visualized.  There is no abnormal fluid collection to suggest abscess or other complication from recent cholecystectomy.  No other acute abdominopelvic process noted.  Right upper quadrant ultrasound shows intra and extrahepatic bile duct dilation.  Common bile duct mildly larger than recent MRCP imaging.  Is 8 mm.  Official radiology report(s): DG Chest 2 View  Result Date: 06/15/2021 CLINICAL DATA:  Per Triage: Patient had a cholecystectomy 5 days ago and is having chest pain that is persistent, vomiting and back pain. Patient states she is also being observed for jaundice. Patient appears in discomfort in triage. EXAM: CHEST - 2 VIEW COMPARISON:  None. FINDINGS: Normal heart, mediastinum and hila. Clear lungs.  No pleural effusion or pneumothorax. Skeletal structures are within normal limits. IMPRESSION: Normal chest radiographs. Electronically Signed   By: Lajean Manes M.D.   On: 06/15/2021 15:50   CT Angio Chest PE W and/or Wo Contrast  Result Date: 06/15/2021 CLINICAL DATA:  Patient had a cholecystectomy 5 days ago and is having chest pain, as well as vomiting and back pain and abdominal pain. Also reportedly has jaundice. EXAM: CT ANGIOGRAPHY CHEST CT ABDOMEN AND PELVIS WITH CONTRAST TECHNIQUE: Multidetector CT imaging of the chest was performed using the standard protocol during bolus administration of intravenous contrast. Multiplanar CT image reconstructions and MIPs  were obtained to evaluate the vascular anatomy. Multidetector CT imaging of the abdomen and pelvis was performed using the standard protocol  during bolus administration of intravenous contrast. CONTRAST:  79m OMNIPAQUE IOHEXOL 350 MG/ML SOLN COMPARISON:  01/01/2010. Current right upper quadrant ultrasound. Recent MRI of the abdomen and MRCP. FINDINGS: CTA CHEST FINDINGS Cardiovascular: Pulmonary arteries are well opacified. There is no evidence of a pulmonary embolism. Normal heart and great vessels.  No pericardial effusion. Mediastinum/Nodes: Normal visualized thyroid. No neck base, axillary, mediastinal or hilar masses or enlarged lymph nodes. Normal trachea and esophagus. Lungs/Pleura: Clear lungs.  No pleural effusion or pneumothorax. Musculoskeletal: Normal. Review of the MIP images confirms the above findings. CT ABDOMEN and PELVIS FINDINGS Hepatobiliary: Liver normal in size and attenuation. No masses. Gallbladder surgically absent. Mild intrahepatic bile duct dilation. Common bile duct measures a maximum of 8 mm, demonstrating distal tapering. No evidence of a duct stone. There is no inflammation or fluid collection in the gallbladder fossa. Pancreas: Unremarkable. No pancreatic ductal dilatation or surrounding inflammatory changes. Spleen: Normal in size without focal abnormality. Adrenals/Urinary Tract: Adrenal glands are unremarkable. Kidneys are normal, without renal calculi, focal lesion, or hydronephrosis. Bladder is unremarkable. Stomach/Bowel: Stomach is within normal limits. Appendix appears normal. No evidence of bowel wall thickening, distention, or inflammatory changes. Vascular/Lymphatic: No vascular abnormality. No enlarged lymph nodes. Reproductive: Uterus and bilateral adnexa are unremarkable. Other: No hernia. No anterior abdominal wall collection. No ascites. Musculoskeletal: Mild lumbar levoscoliosis.  Otherwise unremarkable. Review of the MIP images confirms the above findings.  IMPRESSION: CTA CHEST 1. Normal.  No evidence of a pulmonary embolism.  Clear lungs. CT ABDOMEN AND PELVIS 1. Mild intra and extrahepatic bile duct dilation without CT evidence of a biliary obstruction/common bile duct stone. 2. No gallbladder fossa fluid collection or other finding to suggest a complication of the recent cholecystectomy. 3. No other abnormalities within the abdomen or pelvis. Electronically Signed   By: DLajean ManesM.D.   On: 06/15/2021 18:11   CT ABDOMEN PELVIS W CONTRAST  Result Date: 06/15/2021 CLINICAL DATA:  Patient had a cholecystectomy 5 days ago and is having chest pain, as well as vomiting and back pain and abdominal pain. Also reportedly has jaundice. EXAM: CT ANGIOGRAPHY CHEST CT ABDOMEN AND PELVIS WITH CONTRAST TECHNIQUE: Multidetector CT imaging of the chest was performed using the standard protocol during bolus administration of intravenous contrast. Multiplanar CT image reconstructions and MIPs were obtained to evaluate the vascular anatomy. Multidetector CT imaging of the abdomen and pelvis was performed using the standard protocol during bolus administration of intravenous contrast. CONTRAST:  833mOMNIPAQUE IOHEXOL 350 MG/ML SOLN COMPARISON:  01/01/2010. Current right upper quadrant ultrasound. Recent MRI of the abdomen and MRCP. FINDINGS: CTA CHEST FINDINGS Cardiovascular: Pulmonary arteries are well opacified. There is no evidence of a pulmonary embolism. Normal heart and great vessels.  No pericardial effusion. Mediastinum/Nodes: Normal visualized thyroid. No neck base, axillary, mediastinal or hilar masses or enlarged lymph nodes. Normal trachea and esophagus. Lungs/Pleura: Clear lungs.  No pleural effusion or pneumothorax. Musculoskeletal: Normal. Review of the MIP images confirms the above findings. CT ABDOMEN and PELVIS FINDINGS Hepatobiliary: Liver normal in size and attenuation. No masses. Gallbladder surgically absent. Mild intrahepatic bile duct dilation. Common  bile duct measures a maximum of 8 mm, demonstrating distal tapering. No evidence of a duct stone. There is no inflammation or fluid collection in the gallbladder fossa. Pancreas: Unremarkable. No pancreatic ductal dilatation or surrounding inflammatory changes. Spleen: Normal in size without focal abnormality. Adrenals/Urinary Tract: Adrenal glands are unremarkable. Kidneys are normal, without renal calculi, focal lesion, or hydronephrosis. Bladder is  unremarkable. Stomach/Bowel: Stomach is within normal limits. Appendix appears normal. No evidence of bowel wall thickening, distention, or inflammatory changes. Vascular/Lymphatic: No vascular abnormality. No enlarged lymph nodes. Reproductive: Uterus and bilateral adnexa are unremarkable. Other: No hernia. No anterior abdominal wall collection. No ascites. Musculoskeletal: Mild lumbar levoscoliosis.  Otherwise unremarkable. Review of the MIP images confirms the above findings. IMPRESSION: CTA CHEST 1. Normal.  No evidence of a pulmonary embolism.  Clear lungs. CT ABDOMEN AND PELVIS 1. Mild intra and extrahepatic bile duct dilation without CT evidence of a biliary obstruction/common bile duct stone. 2. No gallbladder fossa fluid collection or other finding to suggest a complication of the recent cholecystectomy. 3. No other abnormalities within the abdomen or pelvis. Electronically Signed   By: Lajean Manes M.D.   On: 06/15/2021 18:11   US ABDOMEN LIMITED RUQ (LIVER/GB)  Result Date: 06/15/2021 CLINICAL DATA:  Elevated bilirubin. EXAM: ULTRASOUND ABDOMEN LIMITED RIGHT UPPER QUADRANT COMPARISON:  MRI abdomen with MRCP. FINDINGS: Gallbladder: Surgically absent since the prior MRI. Common bile duct: Diameter: 8 mm Liver: Normal liver size and overall echogenicity. No masses. There is intrahepatic bile duct dilation. Portal vein is patent on color Doppler imaging with normal direction of blood flow towards the liver. Other: None. IMPRESSION: 1. Intra and extrahepatic  bile duct dilation. Common duct mildly larger than a was the prior MRI an intrahepatic biliary dilation is new. Consider a distal common bile duct obstruction/stone, best assessed with either MRCP or ERCP. 2. No other abnormalities.  Status post cholecystectomy. Electronically Signed   By: Lajean Manes M.D.   On: 06/15/2021 17:07    ____________________________________________   PROCEDURES  Procedure(s) performed (including Critical Care):  .1-3 Lead EKG Interpretation  Date/Time: 06/15/2021 8:05 PM Performed by: Lucrezia Starch, MD Authorized by: Lucrezia Starch, MD     Interpretation: normal     ECG rate assessment: normal     Rhythm: sinus rhythm     Ectopy: none     Conduction: normal     ____________________________________________   INITIAL IMPRESSION / ASSESSMENT AND PLAN / ED COURSE      Patient presents with above to history exam for progressive worsening epigastric and chest pain associate with nonbloody nonbilious vomiting after recent cholecystectomy.  On arrival she appears dehydrated but is otherwise hemodynamically stable.  She is tender in epigastrium and appears jaundiced.  Differential includes possible PE, ACS, pneumonia, esophagitis recent vomiting, peptic ulcer disease, choledocholithiasis, pancreatitis, diverticulitis and acute infectious enteritis versus a cystitis.  ECG is not suggestive of acute ischemia.  D-dimer is elevated at 2.3 not entirely unexpected given recent surgery.  CMP remarkable for persistent transaminitis with AST of 141 compared to 2020 3 days ago, ALT of 231 compared to 225 3 days ago and rising alk phos and bilirubin with alk phos of 171 today compared to 116 3 days ago and a T bili of 5.2 compared to 2.93 days ago.  CBC shows no leukocytosis or acute anemia.  Pregnancy test is negative.  INR 0.9.  Lipase not consistent with acute pancreatitis.  UA is large bilirubin, significant ketones and protein but no convincing evidence of cystitis  or infection.  COVID influenza PCR is negative.  Chest x-ray has no focal consolidation, effusion, edema, pneumothorax or any other clear acute intrathoracic process.  CTA chest abdomen pelvis shows no evidence of PE, pneumonia, pleural effusion but does show evidence of some mild intra and extrahepatic bile duct dilation.  No stone is visualized.  There is no abnormal fluid collection to suggest abscess or other complication from recent cholecystectomy.  No other acute abdominopelvic process noted.Right upper quadrant ultrasound shows intra and extrahepatic bile duct dilation.  Common bile duct mildly larger than recent MRCP imaging.  Is 8 mm.  Overall I suspect patient symptoms are likely related to choledocholithiasis from retained common bile duct stone.  This is evidenced by rising bilirubin, alk phos and dilated common bile duct but otherwise no acute abnormalities noted on ultrasound or CT's.  Patient treated with IV fluids, Zofran and IV analgesia.    Low suspicion for cholangitis at this time.  Discussed findings and presentation with on-call gastroenterologist Dr. Vicente Males who stated that given common bile duct was greater than 6 mm and bilirubin was over 4 per guidelines may forego MRCP will require ERCP.  He recommended reaching out to [facility] for transfer.   I did discuss with on-call hospitalist at [facility] Dr. Olevia Bowens who said he would tentatively accept if I spoke with GI at [facility] who is amenable to transfer as well.  Page sent to GI at [facility].  Discussed with on-call GI Dr. Aldona Lento at [facility] who agreed with plan to transfer to [facility] on hospital service.  States she can get a  MRCP once arrived there and did not have to stay in the emergency room at [facility] to get this done but would likely need an ERCP regardless tomorrow.  Care patient signed over to Dr. Owens Shark at approximately 8:20 PM.  Plan is for patient to be transferred to [facility] pending bed  availability.   ____________________________________________   FINAL CLINICAL IMPRESSION(S) / ED DIAGNOSES  Final diagnoses:  Bilirubinemia  Common biliary duct obstruction    Medications  lactated ringers infusion (has no administration in time range)  ondansetron (ZOFRAN) injection 4 mg (has no administration in time range)  HYDROmorphone (DILAUDID) injection 1 mg (1 mg Intravenous Given 06/15/21 1705)  lactated ringers bolus 1,000 mL (1,000 mLs Intravenous New Bag/Given 06/15/21 1712)  ondansetron (ZOFRAN) injection 4 mg (4 mg Intravenous Given 06/15/21 1705)  iohexol (OMNIPAQUE) 350 MG/ML injection 100 mL (80 mLs Intravenous Contrast Given 06/15/21 1750)  pantoprazole (PROTONIX) injection 40 mg (40 mg Intravenous Given 06/15/21 1847)     ED Discharge Orders     None        Note:  This document was prepared using Dragon voice recognition software and may include unintentional dictation errors.    Lucrezia Starch, MD 06/15/21 1958    Lucrezia Starch, MD 06/15/21 2018

## 2021-06-15 NOTE — ED Notes (Signed)
Called [email or web address] for update, pt still on list for bed assignment.

## 2021-06-15 NOTE — ED Triage Notes (Signed)
Patient had a cholecystectomy 5 days ago and is having chest pain that is persistent, vomiting and back pain. Patient states she is also being observed for jaundice. Patient appears in discomfort in triage.

## 2021-06-16 ENCOUNTER — Encounter (HOSPITAL_COMMUNITY): Payer: Self-pay | Admitting: Internal Medicine

## 2021-06-16 ENCOUNTER — Observation Stay (HOSPITAL_COMMUNITY)
Admission: AD | Admit: 2021-06-16 | Discharge: 2021-06-17 | Disposition: A | Discharge status: Home / Self Care | Payer: BC Managed Care – PPO | Source: Ambulatory Visit | Attending: Family Medicine | Admitting: Family Medicine

## 2021-06-16 ENCOUNTER — Inpatient Hospital Stay (HOSPITAL_COMMUNITY): Payer: BC Managed Care – PPO

## 2021-06-16 DIAGNOSIS — R7401 Elevation of levels of liver transaminase levels: Secondary | ICD-10-CM | POA: Insufficient documentation

## 2021-06-16 DIAGNOSIS — M545 Low back pain, unspecified: Secondary | ICD-10-CM | POA: Diagnosis not present

## 2021-06-16 DIAGNOSIS — R109 Unspecified abdominal pain: Secondary | ICD-10-CM | POA: Diagnosis present

## 2021-06-16 DIAGNOSIS — R932 Abnormal findings on diagnostic imaging of liver and biliary tract: Secondary | ICD-10-CM | POA: Diagnosis not present

## 2021-06-16 DIAGNOSIS — R17 Unspecified jaundice: Secondary | ICD-10-CM | POA: Diagnosis not present

## 2021-06-16 DIAGNOSIS — K8050 Calculus of bile duct without cholangitis or cholecystitis without obstruction: Secondary | ICD-10-CM

## 2021-06-16 DIAGNOSIS — F410 Panic disorder [episodic paroxysmal anxiety] without agoraphobia: Secondary | ICD-10-CM | POA: Diagnosis present

## 2021-06-16 DIAGNOSIS — Z419 Encounter for procedure for purposes other than remedying health state, unspecified: Secondary | ICD-10-CM

## 2021-06-16 DIAGNOSIS — K838 Other specified diseases of biliary tract: Principal | ICD-10-CM | POA: Insufficient documentation

## 2021-06-16 DIAGNOSIS — R7989 Other specified abnormal findings of blood chemistry: Secondary | ICD-10-CM | POA: Diagnosis not present

## 2021-06-16 DIAGNOSIS — K8051 Calculus of bile duct without cholangitis or cholecystitis with obstruction: Secondary | ICD-10-CM | POA: Insufficient documentation

## 2021-06-16 DIAGNOSIS — K831 Obstruction of bile duct: Secondary | ICD-10-CM

## 2021-06-16 DIAGNOSIS — R1013 Epigastric pain: Secondary | ICD-10-CM | POA: Diagnosis not present

## 2021-06-16 DIAGNOSIS — F411 Generalized anxiety disorder: Secondary | ICD-10-CM | POA: Diagnosis present

## 2021-06-16 LAB — BASIC METABOLIC PANEL
Anion gap: 7 (ref 5–15)
BUN: 8 mg/dL (ref 6–20)
CO2: 28 mmol/L (ref 22–32)
Calcium: 9 mg/dL (ref 8.9–10.3)
Chloride: 101 mmol/L (ref 98–111)
Creatinine, Ser: 0.92 mg/dL (ref 0.44–1.00)
GFR, Estimated: >60 mL/min (ref >60)
Glucose, Bld: 112 mg/dL — ABNORMAL HIGH (ref 70–99)
Potassium: 3.5 mmol/L (ref 3.5–5.1)
Sodium: 136 mmol/L (ref 135–145)

## 2021-06-16 LAB — CBC WITH DIFFERENTIAL/PLATELET
Abs Immature Granulocytes: 0.03 10*3/uL (ref 0.00–0.07)
Basophils Absolute: 0 10*3/uL (ref 0.0–0.1)
Basophils Relative: 0 %
Eosinophils Absolute: 0.2 10*3/uL (ref 0.0–0.5)
Eosinophils Relative: 2 %
HCT: 36.2 % (ref 36.0–46.0)
Hemoglobin: 11.8 g/dL — ABNORMAL LOW (ref 12.0–15.0)
Immature Granulocytes: 0 %
Lymphocytes Relative: 21 %
Lymphs Abs: 1.5 10*3/uL (ref 0.7–4.0)
MCH: 28.6 pg (ref 26.0–34.0)
MCHC: 32.6 g/dL (ref 30.0–36.0)
MCV: 87.7 fL (ref 80.0–100.0)
Monocytes Absolute: 0.9 10*3/uL (ref 0.1–1.0)
Monocytes Relative: 12 %
Neutro Abs: 4.4 10*3/uL (ref 1.7–7.7)
Neutrophils Relative %: 65 %
Platelets: 295 10*3/uL (ref 150–400)
RBC: 4.13 MIL/uL (ref 3.87–5.11)
RDW: 14.1 % (ref 11.5–15.5)
WBC: 7 10*3/uL (ref 4.0–10.5)
nRBC: 0 % (ref 0.0–0.2)

## 2021-06-16 LAB — HEPATIC FUNCTION PANEL
ALT: 226 U/L — ABNORMAL HIGH (ref 0–44)
AST: 143 U/L — ABNORMAL HIGH (ref 15–41)
Albumin: 3.3 g/dL — ABNORMAL LOW (ref 3.5–5.0)
Alkaline Phosphatase: 147 U/L — ABNORMAL HIGH (ref 38–126)
Bilirubin, Direct: 2.4 mg/dL — ABNORMAL HIGH (ref 0.0–0.2)
Indirect Bilirubin: 1.4 mg/dL — ABNORMAL HIGH (ref 0.3–0.9)
Total Bilirubin: 3.8 mg/dL — ABNORMAL HIGH (ref 0.3–1.2)
Total Protein: 6.5 g/dL (ref 6.5–8.1)

## 2021-06-16 LAB — HEPATITIS PANEL, ACUTE
HCV Ab: NONREACTIVE
Hep A IgM: NONREACTIVE
Hep B C IgM: NONREACTIVE
Hepatitis B Surface Ag: NONREACTIVE

## 2021-06-16 LAB — TROPONIN I (HIGH SENSITIVITY): Troponin I (High Sensitivity): <2 ng/L (ref <18)

## 2021-06-16 MED ORDER — LACTATED RINGERS IV SOLN: INTRAVENOUS | Status: DC

## 2021-06-16 MED ORDER — METOCLOPRAMIDE HCL 5 MG/ML IJ SOLN
10.0000 mg | Freq: Once | INTRAMUSCULAR | Status: AC
  Administered 2021-06-16: 10 mg via INTRAVENOUS
  Filled 2021-06-16: qty 2

## 2021-06-16 MED ORDER — HYDROMORPHONE HCL 1 MG/ML IJ SOLN
1.0000 mg | INTRAMUSCULAR | Status: DC | PRN
Start: 2021-06-16 — End: 2021-06-18
  Administered 2021-06-16 (×3): 1 mg via INTRAVENOUS
  Filled 2021-06-16 (×3): qty 1

## 2021-06-16 MED ORDER — PANTOPRAZOLE SODIUM 40 MG IV SOLR
40.0000 mg | INTRAVENOUS | Status: DC
  Administered 2021-06-16: 40 mg via INTRAVENOUS
  Filled 2021-06-16: qty 40

## 2021-06-16 MED ORDER — ONDANSETRON HCL 4 MG/2ML IJ SOLN
4.0000 mg | Freq: Four times a day (QID) | INTRAMUSCULAR | Status: DC | PRN
  Administered 2021-06-16 – 2021-06-17 (×2): 4 mg via INTRAVENOUS
  Filled 2021-06-16: qty 2

## 2021-06-16 MED ORDER — HYDROMORPHONE HCL 1 MG/ML IJ SOLN: 1.0000 mg | Freq: Once | INTRAMUSCULAR | Status: DC | PRN

## 2021-06-16 MED ORDER — SODIUM CHLORIDE 0.9 % IV SOLN: 25.0000 mg | Freq: Once | INTRAVENOUS | Status: DC

## 2021-06-16 MED ORDER — DIPHENHYDRAMINE HCL 50 MG/ML IJ SOLN
25.0000 mg | Freq: Once | INTRAMUSCULAR | Status: AC
  Administered 2021-06-16: 25 mg via INTRAVENOUS
  Filled 2021-06-16: qty 1

## 2021-06-16 NOTE — Progress Notes (Addendum)
[facility] Consult: 8:32 AM 06/16/2021  LOS: 0 days    Referring Provider: Dr Bonner Puna  Primary Care Physician:  [facility] Gastroenterologist:  unassigned  Pt personal cell # is [phone]    Reason for Consultation:  abd pain after lap chole.     HPI: Natalie Welch is a 30 y.o. female.  PMH anxiety/depression.  Iron Def anemia.  C-section 01/2020.  Dilation and evacuation of blighted ovum 12/2018.  Patient has had episodes of substernal chest pain radiating into the back with and without nausea and vomiting.  These became more pronounced at the end of Jun 18, 2023.  Initially diagnosed as GERD and treated with PPI.  Symptoms persisted. Labs 06/10/2021 : T bili 2.  Alk phos 107.  AST/ALT 349/279. 06/10/2021 ultrasound showed fatty liver, cholelithiasis and GB thickening  consistent with acute cholecystitis.  CBD 6.7 mm portal vein Dopplers normal. 06/11/2021 MRI/MRCP: Cholelithiasis without cholecystitis.  CBD 6 to 7 mm without filling defect or stones.  No hepatic steatosis. 06/11/2021 robotic assisted laparoscopic cholecystectomy at [facility].  Per DR Cannon's op note:  "Using ICG cholangiography we visualized the cystic duct and confirmed that there was no aberrant biliary ductal abnormality or evidence of bile duct injury".  Pathology revealed acute on chronic cholecystitis, cholelithiasis, 1 benign lymph node.  No dysplasia, no malignancy.  Return to ED with persistent upper abdominal/epigastric pain radiation to chest.  Nausea and vomiting.  Mostly intolerant to PO although had tolerated a couple of meals.  No fevers or chills.  + Generalized pruritus.  Gray/white stools.  Tea colored urine. T bili 5.2 >> 3.8.  Alk phos 171 >> 147.  AST/ALT 141/231 >> 143/226. WBCs 8.4.  Hb 13.5  >>11.8 following IV  fluids CTAP/chest.  Mild intra and extrahepatic biliary ductal dilatation but no obvious source of obstruction and no CBD stone.  CBD measures 8 mm.  No fluid collection in the gallbladder fossa. MRI/MRCP: Mild intrahepatic biliary ductal dilatation.  1 to 2-minute millimeter focus in distal CBD, possibly artifact.  Given history and intrahepatic ductal dilatation, consider ERCP.  Not drinking alcohol but previously drank up to 7 standard drinks weekly.  No illicit drugs.  No tobacco.  She is employed doing Engineer, mining work  Family history of mother status post cholecystectomy in her 70s.  No family history of liver issues, colorectal cancer, peptic ulcer disease.    Past Medical History:  Diagnosis Date   Allergy    Anemia    Anxiety    no meds   Complication of anesthesia    Depression    PONV (postoperative nausea and vomiting)     Past Surgical History:  Procedure Laterality Date   CESAREAN SECTION N/A 02/07/2020   Procedure: CESAREAN SECTION;  Surgeon: Will Bonnet, MD;  Location: [facility] ORS;  Service: Obstetrics;  Laterality: N/A;   DILATION AND EVACUATION N/A 12/30/2018   Procedure: DILATATION AND EVACUATION;  Surgeon: Malachy Mood, MD;  Location: [facility] ORS;  Service: Gynecology;  Laterality: N/A;   WISDOM TOOTH  EXTRACTION      Prior to Admission medications   Medication Sig Start Date End Date Taking? Authorizing Provider  citalopram (CELEXA) 20 MG tablet TAKE 1 TABLET BY MOUTH EVERY DAY 02/12/21   Schuman, Christanna R, MD  ibuprofen (ADVIL) 600 MG tablet Take 1 tablet (600 mg total) by mouth every 6 (six) hours as needed for mild pain. 06/12/21   Tylene Fantasia, PA-C  omeprazole (PRILOSEC) 20 MG capsule Take 20 mg by mouth daily. 05/20/21   [provider]  ondansetron (ZOFRAN ODT) 4 MG disintegrating tablet Take 1 tablet (4 mg total) by mouth every 8 (eight) hours as needed for nausea or vomiting. 06/12/21   Tylene Fantasia, PA-C   oxyCODONE (OXY IR/ROXICODONE) 5 MG immediate release tablet Take 1 tablet (5 mg total) by mouth every 4 (four) hours as needed for severe pain or breakthrough pain. 06/12/21   Tylene Fantasia, PA-C  promethazine (PHENERGAN) 25 MG tablet Take 25 mg by mouth every 6 (six) hours as needed. 05/20/21   [provider]    Scheduled Meds:  Infusions:  lactated ringers 150 mL/hr at 06/16/21 0518   PRN Meds: HYDROmorphone (DILAUDID) injection   Allergies as of 06/15/2021 - Review Complete 06/15/2021  Allergen Reaction Noted   Abilify [aripiprazole] Other (See Comments) 11/23/2018   Nuvigil [armodafinil] Swelling 11/23/2018    Family History  Problem Relation Age of Onset   Hypertension Mother    Healthy Father     Social History   Socioeconomic History   Marital status: Married    Spouse name: Ovid Curd   Number of children: Not on file   Years of education: Not on file   Highest education level: Not on file  Occupational History   Not on file  Tobacco Use   Smoking status: Never   Smokeless tobacco: Never  Vaping Use   Vaping Use: Never used  Substance and Sexual Activity   Alcohol use: Not Currently    Alcohol/week: 7.0 standard drinks    Types: 1 Cans of beer, 6 Shots of liquor per week   Drug use: Never   Sexual activity: Yes    Birth control/protection: None  Other Topics Concern   Not on file  Social History Narrative   Not on file   Social Determinants of Health   Financial Resource Strain: Not on file  Food Insecurity: Not on file  Transportation Needs: Not on file  Physical Activity: Not on file  Stress: Not on file  Social Connections: Not on file  Intimate Partner Violence: Not on file    REVIEW OF SYSTEMS: Constitutional: Feeling a bit tired but not profoundly so. ENT:  No nose bleeds Pulm: Not short of breath but if she takes a deep breath it intensifies pain CV:  No palpitations, no LE edema.  GU:  No hematuria, no frequency GI: See  HPI. Heme: No excessive bleeding or bruising.  No menorrhagia. Transfusions: None. Neuro:  No headaches, no peripheral tingling or numbness.  No syncope, no seizures Derm: Pruritus as per HPI.  No rash or sores.  Endocrine:  No sweats or chills.  No polyuria or dysuria Immunization: Not queried.   PHYSICAL EXAM: Vital signs in last 24 hours: Vitals:   06/16/21 0816  BP: 124/77  Pulse: 81  Resp: 16  Temp: 98.2 F (36.8 C)  SpO2: 98%   Wt Readings from Last 3 Encounters:  06/16/21 69.7 kg  06/15/21 68 kg  06/10/21 68 kg  General: Currently comfortable, well-appearing with slight scleral icterus.  Little bit drowsy but maintains arousal and easy to wake up. Head: No facial asymmetry or swelling.  No signs of head trauma. Eyes: Slight scleral icterus.  Conjunctiva pink. Ears: Not hard of hearing Nose: Congestion or discharge. Mouth: Excellent teeth.  Tongue midline.  Mucosa moist, pink, clear. Neck: No JVD, no masses, no thyromegaly Lungs: No labored breathing.  No cough.  Lungs clear bilaterally. Heart: RRR.  No MRG.  S1, S2 present. Abdomen: Soft, not distended.  Laparoscopic surgical scars intact, dry, clean.  Bowel sounds active.  Tenderness is most pronounced in the left lower abdomen but she is tender throughout..  No guarding or rebound. Rectal: Deferred. Musc/Skeltl: No joint contractures, swelling or redness. Extremities: No CCE. Neurologic: Fully alert and oriented.  Full limb strength.  No tremors. Skin: No obvious jaundice.  No rash, no sores, no telangiectasia. Nodes: No cervical adenopathy. Psych: Cooperative, calm, pleasant.  Not obviously depressed.  Intake/Output from previous day: No intake/output data recorded. Intake/Output this shift: No intake/output data recorded.  LAB RESULTS: Recent Labs    06/15/21 1537 06/16/21 0506  WBC 8.4 7.0  HGB 13.5 11.8*  HCT 40.3 36.2  PLT 348 295   BMET Lab Results  Component Value Date   NA 136  06/16/2021   NA 134 (L) 06/15/2021   NA 133 (L) 06/12/2021   K 3.5 06/16/2021   K 3.5 06/15/2021   K 4.8 06/12/2021   CL 101 06/16/2021   CL 98 06/15/2021   CL 103 06/12/2021   CO2 28 06/16/2021   CO2 24 06/15/2021   CO2 23 06/12/2021   GLUCOSE 112 (H) 06/16/2021   GLUCOSE 137 (H) 06/15/2021   GLUCOSE 134 (H) 06/12/2021   BUN 8 06/16/2021   BUN 13 06/15/2021   BUN 8 06/12/2021   CREATININE 0.92 06/16/2021   CREATININE 0.65 06/15/2021   CREATININE 0.78 06/12/2021   CALCIUM 9.0 06/16/2021   CALCIUM 9.4 06/15/2021   CALCIUM 8.9 06/12/2021   LFT Recent Labs    06/15/21 1537 06/16/21 0506  PROT 8.1 6.5  ALBUMIN 4.2 3.3*  AST 141* 143*  ALT 231* 226*  ALKPHOS 171* 147*  BILITOT 5.2* 3.8*  BILIDIR  --  2.4*  IBILI  --  1.4*   PT/INR Lab Results  Component Value Date   INR 0.9 06/15/2021   Hepatitis Panel No results for input(s): HEPBSAG, HCVAB, HEPAIGM, HEPBIGM in the last 72 hours. C-Diff No components found for: CDIFF Lipase     Component Value Date/Time   LIPASE 39 06/15/2021 1537    Drugs of Abuse     Component Value Date/Time   LABOPIA NEGATIVE 11/15/2012 1130   COCAINSCRNUR NEGATIVE 11/15/2012 1130   LABBENZ NEGATIVE 11/15/2012 1130   AMPHETMU NEGATIVE 11/15/2012 1130   THCU NEGATIVE 11/15/2012 1130   LABBARB NEGATIVE 11/15/2012 1130     RADIOLOGY STUDIES: DG Chest 2 View  Result Date: 06/15/2021 CLINICAL DATA:  Per Triage: Patient had a cholecystectomy 5 days ago and is having chest pain that is persistent, vomiting and back pain. Patient states she is also being observed for jaundice. Patient appears in discomfort in triage. EXAM: CHEST - 2 VIEW COMPARISON:  None. FINDINGS: Normal heart, mediastinum and hila. Clear lungs.  No pleural effusion or pneumothorax. Skeletal structures are within normal limits. IMPRESSION: Normal chest radiographs. Electronically Signed   By: Lajean Manes M.D.   On: 06/15/2021 15:50   CT Angio Chest PE  W and/or Wo  Contrast  Result Date: 06/15/2021 CLINICAL DATA:  Patient had a cholecystectomy 5 days ago and is having chest pain, as well as vomiting and back pain and abdominal pain. Also reportedly has jaundice. EXAM: CT ANGIOGRAPHY CHEST CT ABDOMEN AND PELVIS WITH CONTRAST TECHNIQUE: Multidetector CT imaging of the chest was performed using the standard protocol during bolus administration of intravenous contrast. Multiplanar CT image reconstructions and MIPs were obtained to evaluate the vascular anatomy. Multidetector CT imaging of the abdomen and pelvis was performed using the standard protocol during bolus administration of intravenous contrast. CONTRAST:  78m OMNIPAQUE IOHEXOL 350 MG/ML SOLN COMPARISON:  01/01/2010. Current right upper quadrant ultrasound. Recent MRI of the abdomen and MRCP. FINDINGS: CTA CHEST FINDINGS Cardiovascular: Pulmonary arteries are well opacified. There is no evidence of a pulmonary embolism. Normal heart and great vessels.  No pericardial effusion. Mediastinum/Nodes: Normal visualized thyroid. No neck base, axillary, mediastinal or hilar masses or enlarged lymph nodes. Normal trachea and esophagus. Lungs/Pleura: Clear lungs.  No pleural effusion or pneumothorax. Musculoskeletal: Normal. Review of the MIP images confirms the above findings. CT ABDOMEN and PELVIS FINDINGS Hepatobiliary: Liver normal in size and attenuation. No masses. Gallbladder surgically absent. Mild intrahepatic bile duct dilation. Common bile duct measures a maximum of 8 mm, demonstrating distal tapering. No evidence of a duct stone. There is no inflammation or fluid collection in the gallbladder fossa. Pancreas: Unremarkable. No pancreatic ductal dilatation or surrounding inflammatory changes. Spleen: Normal in size without focal abnormality. Adrenals/Urinary Tract: Adrenal glands are unremarkable. Kidneys are normal, without renal calculi, focal lesion, or hydronephrosis. Bladder is unremarkable. Stomach/Bowel: Stomach  is within normal limits. Appendix appears normal. No evidence of bowel wall thickening, distention, or inflammatory changes. Vascular/Lymphatic: No vascular abnormality. No enlarged lymph nodes. Reproductive: Uterus and bilateral adnexa are unremarkable. Other: No hernia. No anterior abdominal wall collection. No ascites. Musculoskeletal: Mild lumbar levoscoliosis.  Otherwise unremarkable. Review of the MIP images confirms the above findings. IMPRESSION: CTA CHEST 1. Normal.  No evidence of a pulmonary embolism.  Clear lungs. CT ABDOMEN AND PELVIS 1. Mild intra and extrahepatic bile duct dilation without CT evidence of a biliary obstruction/common bile duct stone. 2. No gallbladder fossa fluid collection or other finding to suggest a complication of the recent cholecystectomy. 3. No other abnormalities within the abdomen or pelvis. Electronically Signed   By: DLajean ManesM.D.   On: 06/15/2021 18:11   CT ABDOMEN PELVIS W CONTRAST  Result Date: 06/15/2021 CLINICAL DATA:  Patient had a cholecystectomy 5 days ago and is having chest pain, as well as vomiting and back pain and abdominal pain. Also reportedly has jaundice. EXAM: CT ANGIOGRAPHY CHEST CT ABDOMEN AND PELVIS WITH CONTRAST TECHNIQUE: Multidetector CT imaging of the chest was performed using the standard protocol during bolus administration of intravenous contrast. Multiplanar CT image reconstructions and MIPs were obtained to evaluate the vascular anatomy. Multidetector CT imaging of the abdomen and pelvis was performed using the standard protocol during bolus administration of intravenous contrast. CONTRAST:  829mOMNIPAQUE IOHEXOL 350 MG/ML SOLN COMPARISON:  01/01/2010. Current right upper quadrant ultrasound. Recent MRI of the abdomen and MRCP. FINDINGS: CTA CHEST FINDINGS Cardiovascular: Pulmonary arteries are well opacified. There is no evidence of a pulmonary embolism. Normal heart and great vessels.  No pericardial effusion. Mediastinum/Nodes:  Normal visualized thyroid. No neck base, axillary, mediastinal or hilar masses or enlarged lymph nodes. Normal trachea and esophagus. Lungs/Pleura: Clear lungs.  No pleural effusion or pneumothorax. Musculoskeletal: Normal. Review  of the MIP images confirms the above findings. CT ABDOMEN and PELVIS FINDINGS Hepatobiliary: Liver normal in size and attenuation. No masses. Gallbladder surgically absent. Mild intrahepatic bile duct dilation. Common bile duct measures a maximum of 8 mm, demonstrating distal tapering. No evidence of a duct stone. There is no inflammation or fluid collection in the gallbladder fossa. Pancreas: Unremarkable. No pancreatic ductal dilatation or surrounding inflammatory changes. Spleen: Normal in size without focal abnormality. Adrenals/Urinary Tract: Adrenal glands are unremarkable. Kidneys are normal, without renal calculi, focal lesion, or hydronephrosis. Bladder is unremarkable. Stomach/Bowel: Stomach is within normal limits. Appendix appears normal. No evidence of bowel wall thickening, distention, or inflammatory changes. Vascular/Lymphatic: No vascular abnormality. No enlarged lymph nodes. Reproductive: Uterus and bilateral adnexa are unremarkable. Other: No hernia. No anterior abdominal wall collection. No ascites. Musculoskeletal: Mild lumbar levoscoliosis.  Otherwise unremarkable. Review of the MIP images confirms the above findings. IMPRESSION: CTA CHEST 1. Normal.  No evidence of a pulmonary embolism.  Clear lungs. CT ABDOMEN AND PELVIS 1. Mild intra and extrahepatic bile duct dilation without CT evidence of a biliary obstruction/common bile duct stone. 2. No gallbladder fossa fluid collection or other finding to suggest a complication of the recent cholecystectomy. 3. No other abnormalities within the abdomen or pelvis. Electronically Signed   By: Lajean Manes M.D.   On: 06/15/2021 18:11   MR ABDOMEN MRCP WO CONTRAST  Result Date: 06/16/2021 CLINICAL DATA:  Cholecystectomy 5  days ago with chest/abdominal pain. Vomiting. Back pain. Jaundice. EXAM: MRI ABDOMEN WITHOUT CONTRAST  (INCLUDING MRCP) TECHNIQUE: Multiplanar multisequence MR imaging of the abdomen was performed. Heavily T2-weighted images of the biliary and pancreatic ducts were obtained, and three-dimensional MRCP images were rendered by post processing. COMPARISON:  CT of 1 day prior FINDINGS: Lower chest: Normal heart size without pericardial or pleural effusion. Hepatobiliary: No focal liver lesion. Cholecystectomy, without postoperative fluid collection. The intrahepatic ducts are mildly dilated, example left hepatic duct at 6 mm on 33/14. The common duct is within normal limits after cholecystectomy, 8 mm on 30/14. Followed to the level of the ampulla. A tiny (1-2 mm) apparent filling defect on image 15/5, at or just above the level of the ampulla is not confirmed on other pulse sequences, including the dedicated MRCP images. Pancreas:  Normal, without mass or ductal dilatation. Spleen:  Normal in size, without focal abnormality. Adrenals/Urinary Tract: Normal adrenal glands. Normal kidneys, without hydronephrosis. Stomach/Bowel: Normal stomach and abdominal bowel loops. Vascular/Lymphatic: Normal caliber of the aorta and branch vessels. No retroperitoneal or retrocrural adenopathy. Other:  No ascites. Musculoskeletal: Mild convex left lumbar spine curvature. IMPRESSION: 1. Status post cholecystectomy. Mild intrahepatic biliary duct dilatation. 1-2 mm focus of T2 hypointensity within the distal common duct, only identified on 1 series. Possibly artifactual. Given the intrahepatic biliary duct dilatation and provocative history, if clinically indicated, ERCP should be considered. 2. No postoperative fluid collection or other explanation for patient's symptoms Electronically Signed   By: Abigail Miyamoto M.D.   On: 06/16/2021 07:38   MR 3D Recon At Scanner  Result Date: 06/16/2021 CLINICAL DATA:  Cholecystectomy 5 days ago  with chest/abdominal pain. Vomiting. Back pain. Jaundice. EXAM: MRI ABDOMEN WITHOUT CONTRAST  (INCLUDING MRCP) TECHNIQUE: Multiplanar multisequence MR imaging of the abdomen was performed. Heavily T2-weighted images of the biliary and pancreatic ducts were obtained, and three-dimensional MRCP images were rendered by post processing. COMPARISON:  CT of 1 day prior FINDINGS: Lower chest: Normal heart size without pericardial or pleural effusion. Hepatobiliary: No  focal liver lesion. Cholecystectomy, without postoperative fluid collection. The intrahepatic ducts are mildly dilated, example left hepatic duct at 6 mm on 33/14. The common duct is within normal limits after cholecystectomy, 8 mm on 30/14. Followed to the level of the ampulla. A tiny (1-2 mm) apparent filling defect on image 15/5, at or just above the level of the ampulla is not confirmed on other pulse sequences, including the dedicated MRCP images. Pancreas:  Normal, without mass or ductal dilatation. Spleen:  Normal in size, without focal abnormality. Adrenals/Urinary Tract: Normal adrenal glands. Normal kidneys, without hydronephrosis. Stomach/Bowel: Normal stomach and abdominal bowel loops. Vascular/Lymphatic: Normal caliber of the aorta and branch vessels. No retroperitoneal or retrocrural adenopathy. Other:  No ascites. Musculoskeletal: Mild convex left lumbar spine curvature. IMPRESSION: 1. Status post cholecystectomy. Mild intrahepatic biliary duct dilatation. 1-2 mm focus of T2 hypointensity within the distal common duct, only identified on 1 series. Possibly artifactual. Given the intrahepatic biliary duct dilatation and provocative history, if clinically indicated, ERCP should be considered. 2. No postoperative fluid collection or other explanation for patient's symptoms Electronically Signed   By: Abigail Miyamoto M.D.   On: 06/16/2021 07:38   US ABDOMEN LIMITED RUQ (LIVER/GB)  Result Date: 06/15/2021 CLINICAL DATA:  Elevated bilirubin. EXAM:  ULTRASOUND ABDOMEN LIMITED RIGHT UPPER QUADRANT COMPARISON:  MRI abdomen with MRCP. FINDINGS: Gallbladder: Surgically absent since the prior MRI. Common bile duct: Diameter: 8 mm Liver: Normal liver size and overall echogenicity. No masses. There is intrahepatic bile duct dilation. Portal vein is patent on color Doppler imaging with normal direction of blood flow towards the liver. Other: None. IMPRESSION: 1. Intra and extrahepatic bile duct dilation. Common duct mildly larger than a was the prior MRI an intrahepatic biliary dilation is new. Consider a distal common bile duct obstruction/stone, best assessed with either MRCP or ERCP. 2. No other abnormalities.  Status post cholecystectomy. Electronically Signed   By: Lajean Manes M.D.   On: 06/15/2021 17:07      IMPRESSION:      Persistent epigastric/substernal chest pain radiating to back, elevated LFTs in patient 6 d s/p lap chole.  MRCP suggests CBD stone.    PLAN:        Aiming for ERCP with Dr. Silvano Rusk today.  Phone-based anatomy chart used for explanation/discussion with patient.  Risks of pancreatitis, bleeding, infection, inability to perform necessary procedures/interventions, respiratory risk discussed with patient and she is agreeable to proceed.  She is NPO.  Not received any medication based DVT prophylaxis.     Azucena Freed  06/16/2021, 8:32 AM Phone [phone]    Attending Physician's Attestation   I have taken an interval history, reviewed the chart and examined the patient.   30 y/o fm with presumed retained stone in CBD after recent cholesytectomy.  Plan initially for ERCP today, but due to pt stability and overloaded endoscopy schedule today, this will be pushed tomorrow morning.  ERCP to be performed by Dr. Carlean Purl. Hold DVT prophylaxis tomorrow Liquid diet tonight, NPO after midnight  I agree with the Advanced Practitioner's note, impression, and recommendations with updates and my documentation above.    Dustin Flock, MD [facility]

## 2021-06-16 NOTE — Anesthesia Preprocedure Evaluation (Addendum)
Anesthesia Evaluation  Patient identified by MRN, date of birth, ID band Patient awake    Reviewed: Allergy & Precautions, H&P , NPO status , Patient's Chart, lab work & pertinent test results  History of Anesthesia Complications (+) PONV  Airway Mallampati: II  TM Distance: >3 FB Neck ROM: Full    Dental no notable dental hx. (+) Teeth Intact, Dental Advisory Given   Pulmonary neg pulmonary ROS,    Pulmonary exam normal breath sounds clear to auscultation       Cardiovascular Exercise Tolerance: Good hypertension,  Rhythm:Regular Rate:Normal     Neuro/Psych Anxiety Depression negative neurological ROS     GI/Hepatic negative GI ROS, Neg liver ROS,   Endo/Other  negative endocrine ROS  Renal/GU negative Renal ROS  negative genitourinary   Musculoskeletal   Abdominal   Peds  Hematology  (+) Blood dyscrasia, anemia ,   Anesthesia Other Findings   Reproductive/Obstetrics negative OB ROS                            Anesthesia Physical Anesthesia Plan  ASA: 2  Anesthesia Plan: General   Post-op Pain Management:    Induction: Intravenous  PONV Risk Score and Plan: 4 or greater and Ondansetron, Dexamethasone and Midazolam  Airway Management Planned: Oral ETT  Additional Equipment:   Intra-op Plan:   Post-operative Plan: Extubation in OR  Informed Consent: I have reviewed the patients History and Physical, chart, labs and discussed the procedure including the risks, benefits and alternatives for the proposed anesthesia with the patient or authorized representative who has indicated his/her understanding and acceptance.     Dental advisory given  Plan Discussed with: CRNA  Anesthesia Plan Comments:        Anesthesia Quick Evaluation

## 2021-06-16 NOTE — Progress Notes (Addendum)
Due to delays w anesthesia, ERCP is postponed to 0730 tmrw AM.   Wrote pt for [facility] diet, NPO after midnight.  Spoke w pt on phone and gave her updates.    Jennye Moccasin PA-C

## 2021-06-16 NOTE — H&P (View-Only) (Signed)
[facility] Consult: 8:32 AM 06/16/2021  LOS: 0 days    Referring Provider: Dr Bonner Puna  Primary Care Physician:  [place] [facility]:  unassigned  Pt personal cell # is [phone]    Reason for Consultation:  abd pain after lap chole.     HPI: Natalie Welch is a 30 y.o. female.  PMH anxiety/depression.  Iron Def anemia.  C-section 01/2020.  Dilation and evacuation of blighted ovum 12/2018.  Patient has had episodes of substernal chest pain radiating into the back with and without nausea and vomiting.  These became more pronounced at the end of 06/17/23.  Initially diagnosed as GERD and treated with PPI.  Symptoms persisted. Labs 06/10/2021 : T bili 2.  Alk phos 107.  AST/ALT 349/279. 06/10/2021 ultrasound showed fatty liver, cholelithiasis and GB thickening  consistent with acute cholecystitis.  CBD 6.7 mm portal vein Dopplers normal. 06/11/2021 MRI/MRCP: Cholelithiasis without cholecystitis.  CBD 6 to 7 mm without filling defect or stones.  No hepatic steatosis. 06/11/2021 robotic assisted laparoscopic cholecystectomy at [facility].  Per DR Cannon's op note:  "Using ICG cholangiography we visualized the cystic duct and confirmed that there was no aberrant biliary ductal abnormality or evidence of bile duct injury".  Pathology revealed acute on chronic cholecystitis, cholelithiasis, 1 benign lymph node.  No dysplasia, no malignancy.  Return to ED with persistent upper abdominal/epigastric pain radiation to chest.  Nausea and vomiting.  Mostly intolerant to PO although had tolerated a couple of meals.  No fevers or chills.  + Generalized pruritus.  Gray/white stools.  Tea colored urine. T bili 5.2 >> 3.8.  Alk phos 171 >> 147.  AST/ALT 141/231 >> 143/226. WBCs 8.4.  Hb 13.5  >>11.8 following IV  fluids CTAP/chest.  Mild intra and extrahepatic biliary ductal dilatation but no obvious source of obstruction and no CBD stone.  CBD measures 8 mm.  No fluid collection in the gallbladder fossa. MRI/MRCP: Mild intrahepatic biliary ductal dilatation.  1 to 2-minute millimeter focus in distal CBD, possibly artifact.  Given history and intrahepatic ductal dilatation, consider ERCP.  Not drinking alcohol but previously drank up to 7 standard drinks weekly.  No illicit drugs.  No tobacco.  She is employed doing Engineer, mining work  Family history of mother status post cholecystectomy in her 60s.  No family history of liver issues, colorectal cancer, peptic ulcer disease.    Past Medical History:  Diagnosis Date   Allergy    Anemia    Anxiety    no meds   Complication of anesthesia    Depression    PONV (postoperative nausea and vomiting)     Past Surgical History:  Procedure Laterality Date   CESAREAN SECTION N/A 02/07/2020   Procedure: CESAREAN SECTION;  Surgeon: Will Bonnet, MD;  Location: [facility] ORS;  Service: Obstetrics;  Laterality: N/A;   DILATION AND EVACUATION N/A 12/30/2018   Procedure: DILATATION AND EVACUATION;  Surgeon: Malachy Mood, MD;  Location: [facility] ORS;  Service: Gynecology;  Laterality: N/A;   WISDOM TOOTH  EXTRACTION      Prior to Admission medications   Medication Sig Start Date End Date Taking? Authorizing Provider  citalopram (CELEXA) 20 MG tablet TAKE 1 TABLET BY MOUTH EVERY DAY 02/12/21   Schuman, Christanna R, MD  ibuprofen (ADVIL) 600 MG tablet Take 1 tablet (600 mg total) by mouth every 6 (six) hours as needed for mild pain. 06/12/21   Tylene Fantasia, PA-C  omeprazole (PRILOSEC) 20 MG capsule Take 20 mg by mouth daily. 05/20/21   [provider]  ondansetron (ZOFRAN ODT) 4 MG disintegrating tablet Take 1 tablet (4 mg total) by mouth every 8 (eight) hours as needed for nausea or vomiting. 06/12/21   Tylene Fantasia, PA-C   oxyCODONE (OXY IR/ROXICODONE) 5 MG immediate release tablet Take 1 tablet (5 mg total) by mouth every 4 (four) hours as needed for severe pain or breakthrough pain. 06/12/21   Tylene Fantasia, PA-C  promethazine (PHENERGAN) 25 MG tablet Take 25 mg by mouth every 6 (six) hours as needed. 05/20/21   [provider]    Scheduled Meds:  Infusions:  lactated ringers 150 mL/hr at 06/16/21 0518   PRN Meds: HYDROmorphone (DILAUDID) injection   Allergies as of 06/15/2021 - Review Complete 06/15/2021  Allergen Reaction Noted   Abilify [aripiprazole] Other (See Comments) 11/23/2018   Nuvigil [armodafinil] Swelling 11/23/2018    Family History  Problem Relation Age of Onset   Hypertension Mother    Healthy Father     Social History   Socioeconomic History   Marital status: Married    Spouse name: Ovid Curd   Number of children: Not on file   Years of education: Not on file   Highest education level: Not on file  Occupational History   Not on file  Tobacco Use   Smoking status: Never   Smokeless tobacco: Never  Vaping Use   Vaping Use: Never used  Substance and Sexual Activity   Alcohol use: Not Currently    Alcohol/week: 7.0 standard drinks    Types: 1 Cans of beer, 6 Shots of liquor per week   Drug use: Never   Sexual activity: Yes    Birth control/protection: None  Other Topics Concern   Not on file  Social History Narrative   Not on file   Social Determinants of Health   Financial Resource Strain: Not on file  Food Insecurity: Not on file  Transportation Needs: Not on file  Physical Activity: Not on file  Stress: Not on file  Social Connections: Not on file  Intimate Partner Violence: Not on file    REVIEW OF SYSTEMS: Constitutional: Feeling a bit tired but not profoundly so. ENT:  No nose bleeds Pulm: Not short of breath but if she takes a deep breath it intensifies pain CV:  No palpitations, no LE edema.  GU:  No hematuria, no frequency GI: See  HPI. Heme: No excessive bleeding or bruising.  No menorrhagia. Transfusions: None. Neuro:  No headaches, no peripheral tingling or numbness.  No syncope, no seizures Derm: Pruritus as per HPI.  No rash or sores.  Endocrine:  No sweats or chills.  No polyuria or dysuria Immunization: Not queried.   PHYSICAL EXAM: Vital signs in last 24 hours: Vitals:   06/16/21 0816  BP: 124/77  Pulse: 81  Resp: 16  Temp: 98.2 F (36.8 C)  SpO2: 98%   Wt Readings from Last 3 Encounters:  06/16/21 69.7 kg  06/15/21 68 kg  06/10/21 68 kg  General: Currently comfortable, well-appearing with slight scleral icterus.  Little bit drowsy but maintains arousal and easy to wake up. Head: No facial asymmetry or swelling.  No signs of head trauma. Eyes: Slight scleral icterus.  Conjunctiva pink. Ears: Not hard of hearing Nose: Congestion or discharge. Mouth: Excellent teeth.  Tongue midline.  Mucosa moist, pink, clear. Neck: No JVD, no masses, no thyromegaly Lungs: No labored breathing.  No cough.  Lungs clear bilaterally. Heart: RRR.  No MRG.  S1, S2 present. Abdomen: Soft, not distended.  Laparoscopic surgical scars intact, dry, clean.  Bowel sounds active.  Tenderness is most pronounced in the left lower abdomen but she is tender throughout..  No guarding or rebound. Rectal: Deferred. Musc/Skeltl: No joint contractures, swelling or redness. Extremities: No CCE. Neurologic: Fully alert and oriented.  Full limb strength.  No tremors. Skin: No obvious jaundice.  No rash, no sores, no telangiectasia. Nodes: No cervical adenopathy. Psych: Cooperative, calm, pleasant.  Not obviously depressed.  Intake/Output from previous day: No intake/output data recorded. Intake/Output this shift: No intake/output data recorded.  LAB RESULTS: Recent Labs    06/15/21 1537 06/16/21 0506  WBC 8.4 7.0  HGB 13.5 11.8*  HCT 40.3 36.2  PLT 348 295   BMET Lab Results  Component Value Date   NA 136  06/16/2021   NA 134 (L) 06/15/2021   NA 133 (L) 06/12/2021   K 3.5 06/16/2021   K 3.5 06/15/2021   K 4.8 06/12/2021   CL 101 06/16/2021   CL 98 06/15/2021   CL 103 06/12/2021   CO2 28 06/16/2021   CO2 24 06/15/2021   CO2 23 06/12/2021   GLUCOSE 112 (H) 06/16/2021   GLUCOSE 137 (H) 06/15/2021   GLUCOSE 134 (H) 06/12/2021   BUN 8 06/16/2021   BUN 13 06/15/2021   BUN 8 06/12/2021   CREATININE 0.92 06/16/2021   CREATININE 0.65 06/15/2021   CREATININE 0.78 06/12/2021   CALCIUM 9.0 06/16/2021   CALCIUM 9.4 06/15/2021   CALCIUM 8.9 06/12/2021   LFT Recent Labs    06/15/21 1537 06/16/21 0506  PROT 8.1 6.5  ALBUMIN 4.2 3.3*  AST 141* 143*  ALT 231* 226*  ALKPHOS 171* 147*  BILITOT 5.2* 3.8*  BILIDIR  --  2.4*  IBILI  --  1.4*   PT/INR Lab Results  Component Value Date   INR 0.9 06/15/2021   Hepatitis Panel No results for input(s): HEPBSAG, HCVAB, HEPAIGM, HEPBIGM in the last 72 hours. C-Diff No components found for: CDIFF Lipase     Component Value Date/Time   LIPASE 39 06/15/2021 1537    Drugs of Abuse     Component Value Date/Time   LABOPIA NEGATIVE 11/15/2012 1130   COCAINSCRNUR NEGATIVE 11/15/2012 1130   LABBENZ NEGATIVE 11/15/2012 1130   AMPHETMU NEGATIVE 11/15/2012 1130   THCU NEGATIVE 11/15/2012 1130   LABBARB NEGATIVE 11/15/2012 1130     RADIOLOGY STUDIES: DG Chest 2 View  Result Date: 06/15/2021 CLINICAL DATA:  Per Triage: Patient had a cholecystectomy 5 days ago and is having chest pain that is persistent, vomiting and back pain. Patient states she is also being observed for jaundice. Patient appears in discomfort in triage. EXAM: CHEST - 2 VIEW COMPARISON:  None. FINDINGS: Normal heart, mediastinum and hila. Clear lungs.  No pleural effusion or pneumothorax. Skeletal structures are within normal limits. IMPRESSION: Normal chest radiographs. Electronically Signed   By: Lajean Manes M.D.   On: 06/15/2021 15:50   CT Angio Chest PE  W and/or Wo  Contrast  Result Date: 06/15/2021 CLINICAL DATA:  Patient had a cholecystectomy 5 days ago and is having chest pain, as well as vomiting and back pain and abdominal pain. Also reportedly has jaundice. EXAM: CT ANGIOGRAPHY CHEST CT ABDOMEN AND PELVIS WITH CONTRAST TECHNIQUE: Multidetector CT imaging of the chest was performed using the standard protocol during bolus administration of intravenous contrast. Multiplanar CT image reconstructions and MIPs were obtained to evaluate the vascular anatomy. Multidetector CT imaging of the abdomen and pelvis was performed using the standard protocol during bolus administration of intravenous contrast. CONTRAST:  65m OMNIPAQUE IOHEXOL 350 MG/ML SOLN COMPARISON:  01/01/2010. Current right upper quadrant ultrasound. Recent MRI of the abdomen and MRCP. FINDINGS: CTA CHEST FINDINGS Cardiovascular: Pulmonary arteries are well opacified. There is no evidence of a pulmonary embolism. Normal heart and great vessels.  No pericardial effusion. Mediastinum/Nodes: Normal visualized thyroid. No neck base, axillary, mediastinal or hilar masses or enlarged lymph nodes. Normal trachea and esophagus. Lungs/Pleura: Clear lungs.  No pleural effusion or pneumothorax. Musculoskeletal: Normal. Review of the MIP images confirms the above findings. CT ABDOMEN and PELVIS FINDINGS Hepatobiliary: Liver normal in size and attenuation. No masses. Gallbladder surgically absent. Mild intrahepatic bile duct dilation. Common bile duct measures a maximum of 8 mm, demonstrating distal tapering. No evidence of a duct stone. There is no inflammation or fluid collection in the gallbladder fossa. Pancreas: Unremarkable. No pancreatic ductal dilatation or surrounding inflammatory changes. Spleen: Normal in size without focal abnormality. Adrenals/Urinary Tract: Adrenal glands are unremarkable. Kidneys are normal, without renal calculi, focal lesion, or hydronephrosis. Bladder is unremarkable. Stomach/Bowel: Stomach  is within normal limits. Appendix appears normal. No evidence of bowel wall thickening, distention, or inflammatory changes. Vascular/Lymphatic: No vascular abnormality. No enlarged lymph nodes. Reproductive: Uterus and bilateral adnexa are unremarkable. Other: No hernia. No anterior abdominal wall collection. No ascites. Musculoskeletal: Mild lumbar levoscoliosis.  Otherwise unremarkable. Review of the MIP images confirms the above findings. IMPRESSION: CTA CHEST 1. Normal.  No evidence of a pulmonary embolism.  Clear lungs. CT ABDOMEN AND PELVIS 1. Mild intra and extrahepatic bile duct dilation without CT evidence of a biliary obstruction/common bile duct stone. 2. No gallbladder fossa fluid collection or other finding to suggest a complication of the recent cholecystectomy. 3. No other abnormalities within the abdomen or pelvis. Electronically Signed   By: DLajean ManesM.D.   On: 06/15/2021 18:11   CT ABDOMEN PELVIS W CONTRAST  Result Date: 06/15/2021 CLINICAL DATA:  Patient had a cholecystectomy 5 days ago and is having chest pain, as well as vomiting and back pain and abdominal pain. Also reportedly has jaundice. EXAM: CT ANGIOGRAPHY CHEST CT ABDOMEN AND PELVIS WITH CONTRAST TECHNIQUE: Multidetector CT imaging of the chest was performed using the standard protocol during bolus administration of intravenous contrast. Multiplanar CT image reconstructions and MIPs were obtained to evaluate the vascular anatomy. Multidetector CT imaging of the abdomen and pelvis was performed using the standard protocol during bolus administration of intravenous contrast. CONTRAST:  842mOMNIPAQUE IOHEXOL 350 MG/ML SOLN COMPARISON:  01/01/2010. Current right upper quadrant ultrasound. Recent MRI of the abdomen and MRCP. FINDINGS: CTA CHEST FINDINGS Cardiovascular: Pulmonary arteries are well opacified. There is no evidence of a pulmonary embolism. Normal heart and great vessels.  No pericardial effusion. Mediastinum/Nodes:  Normal visualized thyroid. No neck base, axillary, mediastinal or hilar masses or enlarged lymph nodes. Normal trachea and esophagus. Lungs/Pleura: Clear lungs.  No pleural effusion or pneumothorax. Musculoskeletal: Normal. Review  of the MIP images confirms the above findings. CT ABDOMEN and PELVIS FINDINGS Hepatobiliary: Liver normal in size and attenuation. No masses. Gallbladder surgically absent. Mild intrahepatic bile duct dilation. Common bile duct measures a maximum of 8 mm, demonstrating distal tapering. No evidence of a duct stone. There is no inflammation or fluid collection in the gallbladder fossa. Pancreas: Unremarkable. No pancreatic ductal dilatation or surrounding inflammatory changes. Spleen: Normal in size without focal abnormality. Adrenals/Urinary Tract: Adrenal glands are unremarkable. Kidneys are normal, without renal calculi, focal lesion, or hydronephrosis. Bladder is unremarkable. Stomach/Bowel: Stomach is within normal limits. Appendix appears normal. No evidence of bowel wall thickening, distention, or inflammatory changes. Vascular/Lymphatic: No vascular abnormality. No enlarged lymph nodes. Reproductive: Uterus and bilateral adnexa are unremarkable. Other: No hernia. No anterior abdominal wall collection. No ascites. Musculoskeletal: Mild lumbar levoscoliosis.  Otherwise unremarkable. Review of the MIP images confirms the above findings. IMPRESSION: CTA CHEST 1. Normal.  No evidence of a pulmonary embolism.  Clear lungs. CT ABDOMEN AND PELVIS 1. Mild intra and extrahepatic bile duct dilation without CT evidence of a biliary obstruction/common bile duct stone. 2. No gallbladder fossa fluid collection or other finding to suggest a complication of the recent cholecystectomy. 3. No other abnormalities within the abdomen or pelvis. Electronically Signed   By: Lajean Manes M.D.   On: 06/15/2021 18:11   MR ABDOMEN MRCP WO CONTRAST  Result Date: 06/16/2021 CLINICAL DATA:  Cholecystectomy 5  days ago with chest/abdominal pain. Vomiting. Back pain. Jaundice. EXAM: MRI ABDOMEN WITHOUT CONTRAST  (INCLUDING MRCP) TECHNIQUE: Multiplanar multisequence MR imaging of the abdomen was performed. Heavily T2-weighted images of the biliary and pancreatic ducts were obtained, and three-dimensional MRCP images were rendered by post processing. COMPARISON:  CT of 1 day prior FINDINGS: Lower chest: Normal heart size without pericardial or pleural effusion. Hepatobiliary: No focal liver lesion. Cholecystectomy, without postoperative fluid collection. The intrahepatic ducts are mildly dilated, example left hepatic duct at 6 mm on 33/14. The common duct is within normal limits after cholecystectomy, 8 mm on 30/14. Followed to the level of the ampulla. A tiny (1-2 mm) apparent filling defect on image 15/5, at or just above the level of the ampulla is not confirmed on other pulse sequences, including the dedicated MRCP images. Pancreas:  Normal, without mass or ductal dilatation. Spleen:  Normal in size, without focal abnormality. Adrenals/Urinary Tract: Normal adrenal glands. Normal kidneys, without hydronephrosis. Stomach/Bowel: Normal stomach and abdominal bowel loops. Vascular/Lymphatic: Normal caliber of the aorta and branch vessels. No retroperitoneal or retrocrural adenopathy. Other:  No ascites. Musculoskeletal: Mild convex left lumbar spine curvature. IMPRESSION: 1. Status post cholecystectomy. Mild intrahepatic biliary duct dilatation. 1-2 mm focus of T2 hypointensity within the distal common duct, only identified on 1 series. Possibly artifactual. Given the intrahepatic biliary duct dilatation and provocative history, if clinically indicated, ERCP should be considered. 2. No postoperative fluid collection or other explanation for patient's symptoms Electronically Signed   By: Abigail Miyamoto M.D.   On: 06/16/2021 07:38   MR 3D Recon At Scanner  Result Date: 06/16/2021 CLINICAL DATA:  Cholecystectomy 5 days ago  with chest/abdominal pain. Vomiting. Back pain. Jaundice. EXAM: MRI ABDOMEN WITHOUT CONTRAST  (INCLUDING MRCP) TECHNIQUE: Multiplanar multisequence MR imaging of the abdomen was performed. Heavily T2-weighted images of the biliary and pancreatic ducts were obtained, and three-dimensional MRCP images were rendered by post processing. COMPARISON:  CT of 1 day prior FINDINGS: Lower chest: Normal heart size without pericardial or pleural effusion. Hepatobiliary: No  focal liver lesion. Cholecystectomy, without postoperative fluid collection. The intrahepatic ducts are mildly dilated, example left hepatic duct at 6 mm on 33/14. The common duct is within normal limits after cholecystectomy, 8 mm on 30/14. Followed to the level of the ampulla. A tiny (1-2 mm) apparent filling defect on image 15/5, at or just above the level of the ampulla is not confirmed on other pulse sequences, including the dedicated MRCP images. Pancreas:  Normal, without mass or ductal dilatation. Spleen:  Normal in size, without focal abnormality. Adrenals/Urinary Tract: Normal adrenal glands. Normal kidneys, without hydronephrosis. Stomach/Bowel: Normal stomach and abdominal bowel loops. Vascular/Lymphatic: Normal caliber of the aorta and branch vessels. No retroperitoneal or retrocrural adenopathy. Other:  No ascites. Musculoskeletal: Mild convex left lumbar spine curvature. IMPRESSION: 1. Status post cholecystectomy. Mild intrahepatic biliary duct dilatation. 1-2 mm focus of T2 hypointensity within the distal common duct, only identified on 1 series. Possibly artifactual. Given the intrahepatic biliary duct dilatation and provocative history, if clinically indicated, ERCP should be considered. 2. No postoperative fluid collection or other explanation for patient's symptoms Electronically Signed   By: Abigail Miyamoto M.D.   On: 06/16/2021 07:38   US ABDOMEN LIMITED RUQ (LIVER/GB)  Result Date: 06/15/2021 CLINICAL DATA:  Elevated bilirubin. EXAM:  ULTRASOUND ABDOMEN LIMITED RIGHT UPPER QUADRANT COMPARISON:  MRI abdomen with MRCP. FINDINGS: Gallbladder: Surgically absent since the prior MRI. Common bile duct: Diameter: 8 mm Liver: Normal liver size and overall echogenicity. No masses. There is intrahepatic bile duct dilation. Portal vein is patent on color Doppler imaging with normal direction of blood flow towards the liver. Other: None. IMPRESSION: 1. Intra and extrahepatic bile duct dilation. Common duct mildly larger than a was the prior MRI an intrahepatic biliary dilation is new. Consider a distal common bile duct obstruction/stone, best assessed with either MRCP or ERCP. 2. No other abnormalities.  Status post cholecystectomy. Electronically Signed   By: Lajean Manes M.D.   On: 06/15/2021 17:07      IMPRESSION:      Persistent epigastric/substernal chest pain radiating to back, elevated LFTs in patient 6 d s/p lap chole.  MRCP suggests CBD stone.    PLAN:        Aiming for ERCP with Dr. Silvano Rusk today.  Phone-based anatomy chart used for explanation/discussion with patient.  Risks of pancreatitis, bleeding, infection, inability to perform necessary procedures/interventions, respiratory risk discussed with patient and she is agreeable to proceed.  She is NPO.  Not received any medication based DVT prophylaxis.     Azucena Freed  06/16/2021, 8:32 AM Phone [phone]    Attending Physician's Attestation   I have taken an interval history, reviewed the chart and examined the patient.   30 y/o fm with presumed retained stone in CBD after recent cholesytectomy.  Plan initially for ERCP today, but due to pt stability and overloaded endoscopy schedule today, this will be pushed tomorrow morning.  ERCP to be performed by Dr. Carlean Purl. Hold DVT prophylaxis tomorrow Liquid diet tonight, NPO after midnight  I agree with the Advanced Practitioner's note, impression, and recommendations with updates and my documentation above.    Dustin Flock, MD [facility]

## 2021-06-16 NOTE — ED Notes (Signed)
Chart checked for completion 

## 2021-06-16 NOTE — ED Notes (Signed)
[facility] @ pt placement for follow-up, was informed that bed at [facility] has been ready since 9PM! [facility] did not inform us of bed placement and Jamie @ [facility] said she was unaware of bed assignment.

## 2021-06-16 NOTE — Progress Notes (Addendum)
PROGRESS NOTE  Brief Narrative: Natalie Welch is a 30 y.o. female with a history of anxiety, depression, LTCS 02/07/2020, HLD, gestational HTN, and acute cholecystitis s/p robotic-assisted laparoscopic cholecystectomy 06/11/2021 who returned to the ED at [facility] 8/20 with worsening epigastric pain radiating to the chest, jaundice, body-wide pruritus, and nausea with vomiting. She was afebrile with stable vital signs, LFTs persistently elevated with rise in TBili to 5.2 (from 2.9) and alk phos to 171 (from 116). CBC normal, troponin undetectable, ECG nonischemic. CT chest/abd/pelvis demonstrated mild intra- and extra-hepatic ductal dilatation without postoperative fluid collection or other complication. Dilatation was confirmed by RUQ U/S with CBD measuring 110m. [facility], MRCP showing 1-256mdistal CBD opacity concerning for choledocholithiasis.   Subjective: Minimal abdominal pain following dilaudid administration, but had recurrence of nausea this morning improved by holding extremely still. Still mildly nauseated. Zofran has worked in the past. No chest pain reported. Still severely itchy in torso and head not appreciably relieved by benadryl last night.   Objective: BP 124/77 (BP Location: Right Arm)   Pulse 81   Temp 98.2 F (36.8 C) (Oral)   Resp 16   Wt 69.7 kg   LMP 06/07/2021 Comment: neg preg test 06/10/21  SpO2 98%   BMI 29.03 kg/m   Gen: WDWN F in no distress Pulm: Clear and nonlabored on room air  CV: RRR, no murmur, no JVD, no edema GI: Soft, minimally tender in RUQ. Elsewhere NT, ND, +BS  Neuro: Alert and oriented. No focal deficits. Skin: Mild jaundice. Laparoscopic incision sites c/d/i  Assessment & Plan: Principal Problem:   Abdominal pain Active Problems:   Generalized anxiety disorder with panic attacks   Elevated LFTs  [name] a [age].o. female who presented to the ED 8/20 after uneventful lap chole on 8/16 with  cholestatic symptoms and labs without evidence of cholangitis. [facility], and admitted early this morning.   Reported negative intraoperative cholangiogram per op note 8/16, though slight increase in dilatation of CBD (to 72m77mrom 6-7mm372mlong with blood work indicating increased cholestasis raises question of choledocholithiasis. MRCP shows a 1-2mm 35mcity in distal CBD (vs. artifact). ERCP is planned per GI, hopefully 8/21. Defer postprocedural diet and VTE ppx to GI.  Chest pain: Low suspicion for angina with negative work up thus far. D-dimer is elevated, though without respiratory signs or symptoms, no LE swelling, and negative CTA chest, suspicion for VTE is low.  Hold home celexa and PPI for now.   [name] on amion 06/16/2021, 11:16 AM

## 2021-06-16 NOTE — ED Notes (Signed)
Pt accepted to [facility] 6N07 Per [facility] @ pt Placement. [facility] to transport.

## 2021-06-16 NOTE — H&P (Addendum)
History and Physical    Natalie Welch UXL:[id number] DOB: 05-09-1991 DOA: 06/16/2021  PCP: [facility]  Patient coming from: Home.  Chief Complaint: Abdominal pain.  HPI: Natalie Welch is a 30 y.o. female with history of anxiety disorder who had underwent cholecystectomy on June 11, 2021 about 5 days ago at [facility] and discharged the following day has been experiencing persistent epigastric pain radiating to her chest with nausea vomiting unable to keep anything.  Denies any fever chills.  Presents back to the ER.  ED Course: In the ER patient labs show improvement in AST and ALT from 202 and 295 and it is around 141 and 231 but total bilirubin has worsened from 2.9-5.2.  Lipase is 141.  CBC was unremarkable.  CT angiogram of the chest was negative for pulm embolism and CT abdomen pelvis shows mild intra and extrahepatic ductal dilatation with no definite evidence of choledocholithiasis.  Given the worsening bilirubin level that was concerning for possible choledocholithiasis.  ER physician discussed with on-call gastroenterologist at [facility].  Since patient may require ERCP patient was requested to be transferred to [facility] before which ER physician discussed with Dr. Tomasa Rand gastroenterologist at [facility]  COVID test was negative.  Review of Systems: As per HPI, rest all negative.   Past Medical History:  Diagnosis Date   Allergy    Anemia    Anxiety    no meds   Complication of anesthesia    Depression    PONV (postoperative nausea and vomiting)     Past Surgical History:  Procedure Laterality Date   CESAREAN SECTION N/A 02/07/2020   Procedure: CESAREAN SECTION;  Surgeon: Conard Novak, MD;  Location: [facility] ORS;  Service: Obstetrics;  Laterality: N/A;   DILATION AND EVACUATION N/A 12/30/2018   Procedure: DILATATION AND EVACUATION;  Surgeon: Vena Austria, MD;  Location: [facility] ORS;  Service: Gynecology;  Laterality: N/A;    WISDOM TOOTH EXTRACTION       reports that she has never smoked. She has never used smokeless tobacco. She reports that she does not currently use alcohol after a past usage of about 7.0 standard drinks per week. She reports that she does not use drugs.  Allergies  Allergen Reactions   Abilify [Aripiprazole] Other (See Comments)    Oculogyric crisis --eyes rolled in back of head   Nuvigil [Armodafinil] Swelling    Tongue and throat swelling    Family History  Problem Relation Age of Onset   Hypertension Mother    Healthy Father     Prior to Admission medications   Medication Sig Start Date End Date Taking? Authorizing Provider  citalopram (CELEXA) 20 MG tablet TAKE 1 TABLET BY MOUTH EVERY DAY 02/12/21   Schuman, Christanna R, MD  ibuprofen (ADVIL) 600 MG tablet Take 1 tablet (600 mg total) by mouth every 6 (six) hours as needed for mild pain. 06/12/21   Donovan Kail, PA-C  omeprazole (PRILOSEC) 20 MG capsule Take 20 mg by mouth daily. 05/20/21   [provider]  ondansetron (ZOFRAN ODT) 4 MG disintegrating tablet Take 1 tablet (4 mg total) by mouth every 8 (eight) hours as needed for nausea or vomiting. 06/12/21   Donovan Kail, PA-C  oxyCODONE (OXY IR/ROXICODONE) 5 MG immediate release tablet Take 1 tablet (5 mg total) by mouth every 4 (four) hours as needed for severe pain or breakthrough pain. 06/12/21   Donovan Kail, PA-C  promethazine (PHENERGAN) 25  MG tablet Take 25 mg by mouth every 6 (six) hours as needed. 05/20/21   [provider]    Physical Exam: Constitutional: Moderately built and nourished.  Blood pressure is 139/86 pulse is 90/min temperature is 98.6 respiration is 18/min. Vitals:   06/16/21 0313  Weight: 69.7 kg   Eyes: Anicteric no pallor. ENMT: No discharge from the ears eyes nose and mouth. Neck: No mass felt.  No neck rigidity. Respiratory: No rhonchi or crepitations. Cardiovascular: S1-S2 heard. Abdomen: Soft mild epigastric  tenderness no guarding or rigidity. Musculoskeletal: No edema. Skin: No rash. Neurologic: Alert awake oriented to time place and person.  Moves all extremities. Psychiatric: Appears normal.  Normal affect.   Labs on Admission: I have personally reviewed following labs and imaging studies  CBC: Recent Labs  Lab 06/10/21 1817 06/11/21 0633 06/12/21 0204 06/15/21 1537  WBC 12.5* 9.9 10.4 8.4  NEUTROABS 10.7*  --   --  6.3  HGB 12.6 12.0 12.1 13.5  HCT 37.7 36.6 37.0 40.3  MCV 87.5 88.8 86.4 86.7  PLT 338 268 293 348   Basic Metabolic Panel: Recent Labs  Lab 06/10/21 1817 06/11/21 0633 06/12/21 0204 06/15/21 1537  NA 138 137 133* 134*  K 4.8 4.0 4.8 3.5  CL 102 103 103 98  CO2 GLUCOSE 122* 107* 134* 137*  BUN CREATININE 0.82 0.65 0.78 0.65  CALCIUM 9.5 8.8* 8.9 9.4  MG  --  2.1 2.1  --   PHOS  --  3.3 2.5  --    GFR: Estimated Creatinine Clearance: 92.7 mL/min (by C-G formula based on SCr of 0.65 mg/dL). Liver Function Tests: Recent Labs  Lab 06/10/21 1817 06/11/21 0633 06/12/21 0204 06/15/21 1537  AST 349* 290* 202* 141*  ALT 279* 299* 295* 231*  ALKPHOS 107 101 116 171*  BILITOT 2.0* 3.1* 2.9* 5.2*  PROT 8.3* 7.0 7.4 8.1  ALBUMIN 4.5 3.7 4.2 4.2   Recent Labs  Lab 06/10/21 1817 06/15/21 1537  LIPASE 30 39   No results for input(s): AMMONIA in the last 168 hours. Coagulation Profile: Recent Labs  Lab 06/15/21 1537  INR 0.9   Cardiac Enzymes: No results for input(s): CKTOTAL, CKMB, CKMBINDEX, TROPONINI in the last 168 hours. BNP (last 3 results) No results for input(s): PROBNP in the last 8760 hours. HbA1C: No results for input(s): HGBA1C in the last 72 hours. CBG: No results for input(s): GLUCAP in the last 168 hours. Lipid Profile: No results for input(s): CHOL, HDL, LDLCALC, TRIG, CHOLHDL, LDLDIRECT in the last 72 hours. Thyroid Function Tests: No results for input(s): TSH, T4TOTAL, FREET4, T3FREE, THYROIDAB in  the last 72 hours. Anemia Panel: No results for input(s): VITAMINB12, FOLATE, FERRITIN, TIBC, IRON, RETICCTPCT in the last 72 hours. Urine analysis:    Component Value Date/Time   COLORURINE AMBER (A) 06/15/2021 1701   APPEARANCEUR CLEAR (A) 06/15/2021 1701   APPEARANCEUR Cloudy 11/17/2012 0933   LABSPEC 1.025 06/15/2021 1701   LABSPEC 1.026 11/17/2012 0933   PHURINE 6.0 06/15/2021 1701   GLUCOSEU NEGATIVE 06/15/2021 1701   GLUCOSEU Negative 11/17/2012 0933   HGBUR NEGATIVE 06/15/2021 1701   BILIRUBINUR LARGE (A) 06/15/2021 1701   BILIRUBINUR negative 08/15/2020 0919   BILIRUBINUR Negative 11/17/2012 0933   KETONESUR >160 (A) 06/15/2021 1701   PROTEINUR 30 (A) 06/15/2021 1701   UROBILINOGEN 0.2 08/15/2020 0919   NITRITE NEGATIVE 06/15/2021 1701   LEUKOCYTESUR NEGATIVE 06/15/2021 1701   LEUKOCYTESUR  3+ 11/17/2012 0933   Sepsis Labs: @LABRCNTIP (procalcitonin:4,lacticidven:4) ) Recent Results (from the past 240 hour(s))  Resp Panel by RT-PCR (Flu A&B, Covid) Nasopharyngeal Swab     Status: None   Collection Time: 06/10/21  8:07 PM   Specimen: Nasopharyngeal Swab; Nasopharyngeal(NP) swabs in vial transport medium  Result Value Ref Range Status   SARS Coronavirus 2 by RT PCR NEGATIVE NEGATIVE Final    Comment: (NOTE) SARS-CoV-2 target nucleic acids are NOT DETECTED.  The SARS-CoV-2 RNA is generally detectable in upper respiratory specimens during the acute phase of infection. The lowest concentration of SARS-CoV-2 viral copies this assay can detect is 138 copies/mL. A negative result does not preclude SARS-Cov-2 infection and should not be used as the sole basis for treatment or other patient management decisions. A negative result may occur with  improper specimen collection/handling, submission of specimen other than nasopharyngeal swab, presence of viral mutation(s) within the areas targeted by this assay, and inadequate number of viral copies(<138 copies/mL). A negative  result must be combined with clinical observations, patient history, and epidemiological information. The expected result is Negative.  Fact Sheet for Patients:  06/12/21  Fact Sheet for Healthcare Providers:  BloggerCourse.com  This test is no t yet approved or cleared by the [place] FDA and  has been authorized for detection and/or diagnosis of SARS-CoV-2 by FDA under an Emergency Use Authorization (EUA). This EUA will remain  in effect (meaning this test can be used) for the duration of the COVID-19 declaration under Section 564(b)(1) of the Act, 21 U.S.C.section 360bbb-3(b)(1), unless the authorization is terminated  or revoked sooner.       Influenza A by PCR NEGATIVE NEGATIVE Final   Influenza B by PCR NEGATIVE NEGATIVE Final    Comment: (NOTE) The Xpert Xpress SARS-CoV-2/FLU/RSV plus assay is intended as an aid in the diagnosis of influenza from Nasopharyngeal swab specimens and should not be used as a sole basis for treatment. Nasal washings and aspirates are unacceptable for Xpert Xpress SARS-CoV-2/FLU/RSV testing.  Fact Sheet for Patients: [place]  Fact Sheet for Healthcare Providers: BloggerCourse.com  This test is not yet approved or cleared by the [place] FDA and has been authorized for detection and/or diagnosis of SARS-CoV-2 by FDA under an Emergency Use Authorization (EUA). This EUA will remain in effect (meaning this test can be used) for the duration of the COVID-19 declaration under Section 564(b)(1) of the Act, 21 U.S.C. section 360bbb-3(b)(1), unless the authorization is terminated or revoked.  Performed at [facility], [address] [place], [place]   Resp Panel by RT-PCR (Flu A&B, Covid) Nasopharyngeal Swab     Status: None   Collection Time: 06/15/21  5:01 PM   Specimen: Nasopharyngeal Swab;  Nasopharyngeal(NP) swabs in vial transport medium  Result Value Ref Range Status   SARS Coronavirus 2 by RT PCR NEGATIVE NEGATIVE Final    Comment: (NOTE) SARS-CoV-2 target nucleic acids are NOT DETECTED.  The SARS-CoV-2 RNA is generally detectable in upper respiratory specimens during the acute phase of infection. The lowest concentration of SARS-CoV-2 viral copies this assay can detect is 138 copies/mL. A negative result does not preclude SARS-Cov-2 infection and should not be used as the sole basis for treatment or other patient management decisions. A negative result may occur with  improper specimen collection/handling, submission of specimen other than nasopharyngeal swab, presence of viral mutation(s) within the areas targeted by this assay, and inadequate number of viral copies(<138 copies/mL). A negative result must  be combined with clinical observations, patient history, and epidemiological information. The expected result is Negative.  Fact Sheet for Patients:  BloggerCourse.comhttps://www.fda.gov/media/152166/download  Fact Sheet for Healthcare Providers:  SeriousBroker.ithttps://www.fda.gov/media/152162/download  This test is no t yet approved or cleared by the [place] FDA and  has been authorized for detection and/or diagnosis of SARS-CoV-2 by FDA under an Emergency Use Authorization (EUA). This EUA will remain  in effect (meaning this test can be used) for the duration of the COVID-19 declaration under Section 564(b)(1) of the Act, 21 U.S.C.section 360bbb-3(b)(1), unless the authorization is terminated  or revoked sooner.       Influenza A by PCR NEGATIVE NEGATIVE Final   Influenza B by PCR NEGATIVE NEGATIVE Final    Comment: (NOTE) The Xpert Xpress SARS-CoV-2/FLU/RSV plus assay is intended as an aid in the diagnosis of influenza from Nasopharyngeal swab specimens and should not be used as a sole basis for treatment. Nasal washings and aspirates are unacceptable for Xpert Xpress  SARS-CoV-2/FLU/RSV testing.  Fact Sheet for Patients: BloggerCourse.comhttps://www.fda.gov/media/152166/download  Fact Sheet for Healthcare Providers: SeriousBroker.ithttps://www.fda.gov/media/152162/download  This test is not yet approved or cleared by the [place] FDA and has been authorized for detection and/or diagnosis of SARS-CoV-2 by FDA under an Emergency Use Authorization (EUA). This EUA will remain in effect (meaning this test can be used) for the duration of the COVID-19 declaration under Section 564(b)(1) of the Act, 21 U.S.C. section 360bbb-3(b)(1), unless the authorization is terminated or revoked.  Performed at [facility], [address] [place], [place] [address]      Radiological Exams on Admission: DG Chest 2 View  Result Date: 06/15/2021 CLINICAL DATA:  Per Triage: Patient had a cholecystectomy 5 days ago and is having chest pain that is persistent, vomiting and back pain. Patient states she is also being observed for jaundice. Patient appears in discomfort in triage. EXAM: CHEST - 2 VIEW COMPARISON:  None. FINDINGS: Normal heart, mediastinum and hila. Clear lungs.  No pleural effusion or pneumothorax. Skeletal structures are within normal limits. IMPRESSION: Normal chest radiographs. Electronically Signed   By: Amie Portlandavid  Ormond M.D.   On: 06/15/2021 15:50   CT Angio Chest PE W and/or Wo Contrast  Result Date: 06/15/2021 CLINICAL DATA:  Patient had a cholecystectomy 5 days ago and is having chest pain, as well as vomiting and back pain and abdominal pain. Also reportedly has jaundice. EXAM: CT ANGIOGRAPHY CHEST CT ABDOMEN AND PELVIS WITH CONTRAST TECHNIQUE: Multidetector CT imaging of the chest was performed using the standard protocol during bolus administration of intravenous contrast. Multiplanar CT image reconstructions and MIPs were obtained to evaluate the vascular anatomy. Multidetector CT imaging of the abdomen and pelvis was performed using the standard protocol during bolus  administration of intravenous contrast. CONTRAST:  80mL OMNIPAQUE IOHEXOL 350 MG/ML SOLN COMPARISON:  01/01/2010. Current right upper quadrant ultrasound. Recent MRI of the abdomen and MRCP. FINDINGS: CTA CHEST FINDINGS Cardiovascular: Pulmonary arteries are well opacified. There is no evidence of a pulmonary embolism. Normal heart and great vessels.  No pericardial effusion. Mediastinum/Nodes: Normal visualized thyroid. No neck base, axillary, mediastinal or hilar masses or enlarged lymph nodes. Normal trachea and esophagus. Lungs/Pleura: Clear lungs.  No pleural effusion or pneumothorax. Musculoskeletal: Normal. Review of the MIP images confirms the above findings. CT ABDOMEN and PELVIS FINDINGS Hepatobiliary: Liver normal in size and attenuation. No masses. Gallbladder surgically absent. Mild intrahepatic bile duct dilation. Common bile duct measures a maximum of 8 mm, demonstrating distal tapering. No evidence  of a duct stone. There is no inflammation or fluid collection in the gallbladder fossa. Pancreas: Unremarkable. No pancreatic ductal dilatation or surrounding inflammatory changes. Spleen: Normal in size without focal abnormality. Adrenals/Urinary Tract: Adrenal glands are unremarkable. Kidneys are normal, without renal calculi, focal lesion, or hydronephrosis. Bladder is unremarkable. Stomach/Bowel: Stomach is within normal limits. Appendix appears normal. No evidence of bowel wall thickening, distention, or inflammatory changes. Vascular/Lymphatic: No vascular abnormality. No enlarged lymph nodes. Reproductive: Uterus and bilateral adnexa are unremarkable. Other: No hernia. No anterior abdominal wall collection. No ascites. Musculoskeletal: Mild lumbar levoscoliosis.  Otherwise unremarkable. Review of the MIP images confirms the above findings. IMPRESSION: CTA CHEST 1. Normal.  No evidence of a pulmonary embolism.  Clear lungs. CT ABDOMEN AND PELVIS 1. Mild intra and extrahepatic bile duct dilation  without CT evidence of a biliary obstruction/common bile duct stone. 2. No gallbladder fossa fluid collection or other finding to suggest a complication of the recent cholecystectomy. 3. No other abnormalities within the abdomen or pelvis. Electronically Signed   By: Amie Portland M.D.   On: 06/15/2021 18:11   CT ABDOMEN PELVIS W CONTRAST  Result Date: 06/15/2021 CLINICAL DATA:  Patient had a cholecystectomy 5 days ago and is having chest pain, as well as vomiting and back pain and abdominal pain. Also reportedly has jaundice. EXAM: CT ANGIOGRAPHY CHEST CT ABDOMEN AND PELVIS WITH CONTRAST TECHNIQUE: Multidetector CT imaging of the chest was performed using the standard protocol during bolus administration of intravenous contrast. Multiplanar CT image reconstructions and MIPs were obtained to evaluate the vascular anatomy. Multidetector CT imaging of the abdomen and pelvis was performed using the standard protocol during bolus administration of intravenous contrast. CONTRAST:  80mL OMNIPAQUE IOHEXOL 350 MG/ML SOLN COMPARISON:  01/01/2010. Current right upper quadrant ultrasound. Recent MRI of the abdomen and MRCP. FINDINGS: CTA CHEST FINDINGS Cardiovascular: Pulmonary arteries are well opacified. There is no evidence of a pulmonary embolism. Normal heart and great vessels.  No pericardial effusion. Mediastinum/Nodes: Normal visualized thyroid. No neck base, axillary, mediastinal or hilar masses or enlarged lymph nodes. Normal trachea and esophagus. Lungs/Pleura: Clear lungs.  No pleural effusion or pneumothorax. Musculoskeletal: Normal. Review of the MIP images confirms the above findings. CT ABDOMEN and PELVIS FINDINGS Hepatobiliary: Liver normal in size and attenuation. No masses. Gallbladder surgically absent. Mild intrahepatic bile duct dilation. Common bile duct measures a maximum of 8 mm, demonstrating distal tapering. No evidence of a duct stone. There is no inflammation or fluid collection in the  gallbladder fossa. Pancreas: Unremarkable. No pancreatic ductal dilatation or surrounding inflammatory changes. Spleen: Normal in size without focal abnormality. Adrenals/Urinary Tract: Adrenal glands are unremarkable. Kidneys are normal, without renal calculi, focal lesion, or hydronephrosis. Bladder is unremarkable. Stomach/Bowel: Stomach is within normal limits. Appendix appears normal. No evidence of bowel wall thickening, distention, or inflammatory changes. Vascular/Lymphatic: No vascular abnormality. No enlarged lymph nodes. Reproductive: Uterus and bilateral adnexa are unremarkable. Other: No hernia. No anterior abdominal wall collection. No ascites. Musculoskeletal: Mild lumbar levoscoliosis.  Otherwise unremarkable. Review of the MIP images confirms the above findings. IMPRESSION: CTA CHEST 1. Normal.  No evidence of a pulmonary embolism.  Clear lungs. CT ABDOMEN AND PELVIS 1. Mild intra and extrahepatic bile duct dilation without CT evidence of a biliary obstruction/common bile duct stone. 2. No gallbladder fossa fluid collection or other finding to suggest a complication of the recent cholecystectomy. 3. No other abnormalities within the abdomen or pelvis. Electronically Signed   By: Amie Portland  M.D.   On: 06/15/2021 18:11   US ABDOMEN LIMITED RUQ (LIVER/GB)  Result Date: 06/15/2021 CLINICAL DATA:  Elevated bilirubin. EXAM: ULTRASOUND ABDOMEN LIMITED RIGHT UPPER QUADRANT COMPARISON:  MRI abdomen with MRCP. FINDINGS: Gallbladder: Surgically absent since the prior MRI. Common bile duct: Diameter: 8 mm Liver: Normal liver size and overall echogenicity. No masses. There is intrahepatic bile duct dilation. Portal vein is patent on color Doppler imaging with normal direction of blood flow towards the liver. Other: None. IMPRESSION: 1. Intra and extrahepatic bile duct dilation. Common duct mildly larger than a was the prior MRI an intrahepatic biliary dilation is new. Consider a distal common bile duct  obstruction/stone, best assessed with either MRCP or ERCP. 2. No other abnormalities.  Status post cholecystectomy. Electronically Signed   By: Amie Portland M.D.   On: 06/15/2021 17:07    EKG: Independently reviewed.  Normal sinus rhythm.  Assessment/Plan Principal Problem:   Abdominal pain Active Problems:   Generalized anxiety disorder with panic attacks   Elevated LFTs    Abdominal pain with elevated LFTs and lipase status post recent cholecystectomy about 5 days ago at this time CT abdomen pelvis done shows mild intra and extrahepatic ductal dilatation.  ER physician discussed with gastroenterologist at [facility] as requested MRCP which I ordered.  Keep patient n.p.o. IV fluids pain medications.  Patient is presently afebrile no definite evidence of any cholangitis at this time. 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  Presently NPO.  Since patient has abdominal pain which has been gradually worsening with elevated LFTs will need further work-up and management and inpatient status.   DVT prophylaxis: SCDs.  Avoiding anticoagulation as patient may need possible procedure. Code Status: Full code. Family Communication: Discussed with patient. Disposition Plan: Home. Consults called: Solicitor. Admission status: Inpatient.   Eduard Clos MD [facility] Pager [phone].  If 7PM-7AM, please contact night-coverage [email or web address] Password [facility]  06/16/2021, 4:26 AM

## 2021-06-17 ENCOUNTER — Inpatient Hospital Stay (HOSPITAL_COMMUNITY): Payer: BC Managed Care – PPO

## 2021-06-17 ENCOUNTER — Inpatient Hospital Stay (HOSPITAL_COMMUNITY): Payer: BC Managed Care – PPO | Admitting: Anesthesiology

## 2021-06-17 ENCOUNTER — Encounter (HOSPITAL_COMMUNITY): Payer: Self-pay | Admitting: Internal Medicine

## 2021-06-17 ENCOUNTER — Other Ambulatory Visit: Payer: Self-pay

## 2021-06-17 ENCOUNTER — Encounter (HOSPITAL_COMMUNITY)
Admission: AD | Disposition: A | Discharge status: Home / Self Care | Payer: Self-pay | Source: Ambulatory Visit | Attending: Family Medicine

## 2021-06-17 DIAGNOSIS — K838 Other specified diseases of biliary tract: Secondary | ICD-10-CM

## 2021-06-17 DIAGNOSIS — K8071 Calculus of gallbladder and bile duct without cholecystitis with obstruction: Secondary | ICD-10-CM | POA: Diagnosis not present

## 2021-06-17 DIAGNOSIS — R1013 Epigastric pain: Secondary | ICD-10-CM | POA: Diagnosis not present

## 2021-06-17 DIAGNOSIS — R7401 Elevation of levels of liver transaminase levels: Secondary | ICD-10-CM | POA: Diagnosis not present

## 2021-06-17 DIAGNOSIS — F418 Other specified anxiety disorders: Secondary | ICD-10-CM | POA: Diagnosis not present

## 2021-06-17 DIAGNOSIS — R109 Unspecified abdominal pain: Secondary | ICD-10-CM | POA: Diagnosis not present

## 2021-06-17 DIAGNOSIS — K831 Obstruction of bile duct: Secondary | ICD-10-CM

## 2021-06-17 DIAGNOSIS — K8020 Calculus of gallbladder without cholecystitis without obstruction: Secondary | ICD-10-CM | POA: Diagnosis not present

## 2021-06-17 DIAGNOSIS — K8051 Calculus of bile duct without cholangitis or cholecystitis with obstruction: Secondary | ICD-10-CM | POA: Diagnosis not present

## 2021-06-17 DIAGNOSIS — R932 Abnormal findings on diagnostic imaging of liver and biliary tract: Secondary | ICD-10-CM | POA: Diagnosis not present

## 2021-06-17 HISTORY — PX: ENDOSCOPIC RETROGRADE CHOLANGIOPANCREATOGRAPHY (ERCP) WITH PROPOFOL: SHX5810

## 2021-06-17 HISTORY — PX: REMOVAL OF STONES: SHX5545

## 2021-06-17 HISTORY — PX: SPHINCTEROTOMY: SHX5279

## 2021-06-17 HISTORY — PX: CHOLECYSTECTOMY: SHX55

## 2021-06-17 LAB — COMPREHENSIVE METABOLIC PANEL
ALT: 235 U/L — ABNORMAL HIGH (ref 0–44)
AST: 146 U/L — ABNORMAL HIGH (ref 15–41)
Albumin: 3.1 g/dL — ABNORMAL LOW (ref 3.5–5.0)
Alkaline Phosphatase: 145 U/L — ABNORMAL HIGH (ref 38–126)
Anion gap: 7 (ref 5–15)
BUN: <5 mg/dL — ABNORMAL LOW (ref 6–20)
CO2: 24 mmol/L (ref 22–32)
Calcium: 8.8 mg/dL — ABNORMAL LOW (ref 8.9–10.3)
Chloride: 103 mmol/L (ref 98–111)
Creatinine, Ser: 0.64 mg/dL (ref 0.44–1.00)
GFR, Estimated: >60 mL/min (ref >60)
Glucose, Bld: 104 mg/dL — ABNORMAL HIGH (ref 70–99)
Potassium: 3.6 mmol/L (ref 3.5–5.1)
Sodium: 134 mmol/L — ABNORMAL LOW (ref 135–145)
Total Bilirubin: 3.8 mg/dL — ABNORMAL HIGH (ref 0.3–1.2)
Total Protein: 6.1 g/dL — ABNORMAL LOW (ref 6.5–8.1)

## 2021-06-17 LAB — CBC
HCT: 34.7 % — ABNORMAL LOW (ref 36.0–46.0)
Hemoglobin: 11.4 g/dL — ABNORMAL LOW (ref 12.0–15.0)
MCH: 29.1 pg (ref 26.0–34.0)
MCHC: 32.9 g/dL (ref 30.0–36.0)
MCV: 88.5 fL (ref 80.0–100.0)
Platelets: 249 10*3/uL (ref 150–400)
RBC: 3.92 MIL/uL (ref 3.87–5.11)
RDW: 14.3 % (ref 11.5–15.5)
WBC: 6.8 10*3/uL (ref 4.0–10.5)
nRBC: 0 % (ref 0.0–0.2)

## 2021-06-17 SURGERY — ENDOSCOPIC RETROGRADE CHOLANGIOPANCREATOGRAPHY (ERCP) WITH PROPOFOL
Anesthesia: General

## 2021-06-17 MED ORDER — FENTANYL CITRATE (PF) 100 MCG/2ML IJ SOLN
INTRAMUSCULAR | Status: AC
  Filled 2021-06-17: qty 2

## 2021-06-17 MED ORDER — CIPROFLOXACIN IN D5W 400 MG/200ML IV SOLN
INTRAVENOUS | Status: AC
  Filled 2021-06-17: qty 200

## 2021-06-17 MED ORDER — SODIUM CHLORIDE 0.9 % IV SOLN
INTRAVENOUS | Status: DC | PRN
  Administered 2021-06-17: 50 mL

## 2021-06-17 MED ORDER — INDOMETHACIN 50 MG RE SUPP
RECTAL | Status: AC
  Filled 2021-06-17: qty 2

## 2021-06-17 MED ORDER — PROMETHAZINE HCL 25 MG/ML IJ SOLN
INTRAMUSCULAR | Status: AC
  Filled 2021-06-17: qty 1

## 2021-06-17 MED ORDER — SODIUM CHLORIDE 0.9 % IV SOLN
3.0000 g | Freq: Once | INTRAVENOUS | Status: AC
  Administered 2021-06-17: 3 g via INTRAVENOUS
  Filled 2021-06-17: qty 8

## 2021-06-17 MED ORDER — LACTATED RINGERS IV SOLN: INTRAVENOUS | Status: DC | PRN

## 2021-06-17 MED ORDER — EPHEDRINE SULFATE 50 MG/ML IJ SOLN
INTRAMUSCULAR | Status: DC | PRN
  Administered 2021-06-17: 10 mg via INTRAVENOUS

## 2021-06-17 MED ORDER — FENTANYL CITRATE (PF) 100 MCG/2ML IJ SOLN
INTRAMUSCULAR | Status: DC | PRN
  Administered 2021-06-17 (×2): 50 ug via INTRAVENOUS

## 2021-06-17 MED ORDER — LIDOCAINE 2% (20 MG/ML) 5 ML SYRINGE
INTRAMUSCULAR | Status: DC | PRN
  Administered 2021-06-17: 60 mg via INTRAVENOUS

## 2021-06-17 MED ORDER — INDOMETHACIN 50 MG RE SUPP
RECTAL | Status: DC | PRN
  Administered 2021-06-17: 100 mg via RECTAL

## 2021-06-17 MED ORDER — PROPOFOL 10 MG/ML IV BOLUS
INTRAVENOUS | Status: DC | PRN
  Administered 2021-06-17: 150 mg via INTRAVENOUS

## 2021-06-17 MED ORDER — SUCCINYLCHOLINE CHLORIDE 200 MG/10ML IV SOSY
PREFILLED_SYRINGE | INTRAVENOUS | Status: DC | PRN
  Administered 2021-06-17: 100 mg via INTRAVENOUS

## 2021-06-17 MED ORDER — CITALOPRAM HYDROBROMIDE 20 MG PO TABS
20.0000 mg | ORAL_TABLET | Freq: Every day | ORAL | Status: DC
  Administered 2021-06-17: 20 mg via ORAL
  Filled 2021-06-17: qty 1

## 2021-06-17 MED ORDER — PROMETHAZINE HCL 25 MG/ML IJ SOLN
INTRAMUSCULAR | Status: DC | PRN
  Administered 2021-06-17: 12.5 mg via INTRAVENOUS

## 2021-06-17 MED ORDER — INDOMETHACIN 50 MG RE SUPP
100.0000 mg | Freq: Once | RECTAL | Status: DC
  Filled 2021-06-17: qty 2

## 2021-06-17 MED ORDER — ROCURONIUM BROMIDE 10 MG/ML (PF) SYRINGE
PREFILLED_SYRINGE | INTRAVENOUS | Status: DC | PRN
  Administered 2021-06-17: 4 mg via INTRAVENOUS

## 2021-06-17 MED ORDER — GLUCAGON HCL RDNA (DIAGNOSTIC) 1 MG IJ SOLR
INTRAMUSCULAR | Status: AC
  Filled 2021-06-17: qty 1

## 2021-06-17 MED ORDER — DEXAMETHASONE SODIUM PHOSPHATE 10 MG/ML IJ SOLN
INTRAMUSCULAR | Status: DC | PRN
  Administered 2021-06-17: 5 mg via INTRAVENOUS

## 2021-06-17 MED ORDER — PANTOPRAZOLE SODIUM 40 MG PO TBEC
40.0000 mg | DELAYED_RELEASE_TABLET | Freq: Every day | ORAL | Status: DC
  Administered 2021-06-17: 40 mg via ORAL
  Filled 2021-06-17: qty 1

## 2021-06-17 NOTE — Anesthesia Postprocedure Evaluation (Signed)
Anesthesia Post Note  Patient: Natalie Welch  Procedure(s) Performed: ENDOSCOPIC RETROGRADE CHOLANGIOPANCREATOGRAPHY (ERCP) WITH PROPOFOL REMOVAL OF STONES SPHINCTEROTOMY     Patient location during evaluation: Endoscopy Anesthesia Type: General Level of consciousness: awake and alert Pain management: pain level controlled Vital Signs Assessment: post-procedure vital signs reviewed and stable Respiratory status: spontaneous breathing, nonlabored ventilation and respiratory function stable Cardiovascular status: blood pressure returned to baseline and stable Postop Assessment: no apparent nausea or vomiting Anesthetic complications: no   No notable events documented.  Last Vitals:  Vitals:   06/17/21 0832 06/17/21 0843  BP: 132/69 136/78  Pulse: 77 63  Resp: 17 20  Temp:    SpO2: 99% 98%    Last Pain:  Vitals:   06/17/21 0843  TempSrc:   PainSc: 0-No pain                 Matheau Orona,W. EDMOND

## 2021-06-17 NOTE — Op Note (Addendum)
[facility] Patient Name: Natalie Welch Procedure Date : 06/17/2021 MRN: [id number] Attending MD: Iva Boop , MD Date of Birth: 1991/05/31 CSN: [id number] Age: 30 Admit Type: Inpatient Procedure:                ERCP Indications:              Abnormal MRCP, Suspected bile duct stone(s), For                            therapy of bile duct stone(s) Providers:                Iva Boop, MD, Adolph Pollack, RN, Arlee Muslim Tech., Technician Referring MD:              Medicines:                General Anesthesia, Indomethacin 100 mg PR, Unasyn                            3 g IV Complications:            No immediate complications. Estimated Blood Loss:     Estimated blood loss: none. Procedure:                Pre-Anesthesia Assessment:                           - Prior to the procedure, a History and Physical                            was performed, and patient medications and                            allergies were reviewed. The patient's tolerance of                            previous anesthesia was also reviewed. The risks                            and benefits of the procedure and the sedation                            options and risks were discussed with the patient.                            All questions were answered, and informed consent                            was obtained. Prior Anticoagulants: The patient has                            taken no previous anticoagulant or antiplatelet  agents. ASA Grade Assessment: II - A patient with                            mild systemic disease. After reviewing the risks                            and benefits, the patient was deemed in                            satisfactory condition to undergo the procedure.                           After obtaining informed consent, the scope was                            passed under direct vision. Throughout  the                            procedure, the patient's blood pressure, pulse, and                            oxygen saturations were monitored continuously. The                            [id number]-Q180V ([id number] Olympus duodenoscope was                            introduced through the mouth, and used to inject                            contrast into and used to inject contrast into the                            bile duct. The ERCP was somewhat difficult due to                            challenging cannulation. Successful completion of                            the procedure was aided by wire on pancreatic duct.                            The patient tolerated the procedure well. Scope In: Scope Out: Findings:      Scout film was normal.      Esophagus not seen well. Stomach and duodenum normal as was major       papilla/ No significant bile seen.      Papilla cannulated with wire and preferential pancreatic duct insertion       - easily. Wire left there and a second wire used to access and cannulate       bile duct. Pancreatic duct wire removed. Contrast injected and biliary       tree was mildly dilated, diffusely. Max 7-8 mm. There were a few round  and mobile filling defects seen in distal CBD. No strictures. Biliary       sphincterotomy performed with wire in and normal colored bile       flowed.Balloon sweeps and occlusion cholangiograms did not reveal any       stones and filling defects were gone - suspect they were air bubbles.       Slow drainage noted. Procedure ended. Impression:               - The entire biliary tree was mildly dilated. I did                            not see any stones. There was tapering at the                            ampulla and I wonder if she has/had papillary                            stenosis and has a bit ore dilation of biliary tree                            after cholecystectomy though to my eye, comparing                             MR images not much different. She could have also                            passed the tiny suspected stone. Recommendation:           - Return patient to hospital ward for ongoing care.                           - Clear liquids only today and regular lower fat                            diet tomorrow if ok.                           If she does very well am not opposed to dc today                            but would make sure she feels fine.                           Message me if you dc her and I will arrange lab                            follow-up with me vs surgeon. Otherwise we will see                            in AM.                           Will monitor for resolution of abnl LFT over time  and see how she does. Someties LFTs bump up the day                            after an intervention like htis FYI                           I called and updated husband Procedure Code(s):        --- Professional ---                           [phone], Endoscopic retrograde                            cholangiopancreatography (ERCP); with                            sphincterotomy/papillotomy Diagnosis Code(s):        --- Professional ---                           K83.8, Other specified diseases of biliary tract                           R93.2, Abnormal findings on diagnostic imaging of                            liver and biliary tract CPT copyright 2019 [employer]. All rights reserved. The codes documented in this report are preliminary and upon coder review may  be revised to meet current compliance requirements. Iva Booparl E Shinichi Anguiano, MD 06/17/2021 8:26:00 AM This report has been signed electronically. Number of Addenda: 0

## 2021-06-17 NOTE — Progress Notes (Signed)
PROGRESS NOTE  Natalie Welch  TAV:[id number] DOB: 04/27/1991 DOA: 06/16/2021 PCP: [place]   Brief Narrative: Natalie Welch is a 30 y.o. female with a history of anxiety, depression, LTCS 02/07/2020, HLD, gestational HTN, and acute cholecystitis s/p robotic-assisted laparoscopic cholecystectomy 06/11/2021 who returned to the ED at [facility] 8/20 with worsening epigastric pain radiating to the chest, jaundice, body-wide pruritus, and nausea with vomiting. She was afebrile with stable vital signs, LFTs persistently elevated with rise in TBili to 5.2 (from 2.9) and alk phos to 171 (from 116). CBC normal, troponin undetectable, ECG nonischemic. CT chest/abd/pelvis demonstrated mild intra- and extra-hepatic ductal dilatation without postoperative fluid collection or other complication. Dilatation was confirmed by RUQ U/S with CBD measuring 23mm. She was transferred to [facility] due to ERCP capability, MRCP showing 1-5mm distal CBD opacity concerning for choledocholithiasis. ERCP was performed 8/22 revealing dilated biliary tree with tapering at ampulla without stone noted. Sphincterotomy was performed and the patient is placed on a clear liquid diet.   Assessment & Plan: Principal Problem:   Abdominal pain Active Problems:   Generalized anxiety disorder with panic attacks   Elevated LFTs   Choledocholithiasis   Biliary obstruction  Cholestatic symptoms and LFT elevations: No choledocholithiasis seen at ERCP, sphincterotomy performed. Possibly ampullary stenosis +/- tiny choledocholithiasis which has passed prior to procedure could have caused this presentation. Negative intraoperative cholangiogram per op note 8/16 - Clear liquid diet today. Pt hasn't taken much of anything to this point. Will monitor overnight again. CBC, CMP, lipase has already been ordered.    Chest pain: Low suspicion for angina with negative work up thus far. D-dimer is elevated, though without respiratory signs or  symptoms, no LE swelling, and negative CTA chest, suspicion for VTE is low. - Holding anticoagulation periprocedurally with sphincterotomy performed. SCDs ordered.   Depression:  - Restart home celexa   GERD:  - Restart home PPI  DVT prophylaxis: SCD Code Status: Full Family Communication: None at bedside. GI updated husband. Disposition Plan:  Status is: Inpatient  Remains inpatient appropriate because:IV treatments appropriate due to intensity of illness or inability to take PO  Dispo: The patient is from: Home              Anticipated d/c is to: Home              Patient currently is not medically stable to d/c.   Difficult to place patient No       Consultants:   GI  Procedures:  ERCP Dr. Carlean Purl 06/17/2021: Impression:               - The entire biliary tree was mildly dilated. I did                            not see any stones. There was tapering at the                            ampulla and I wonder if she has/had papillary                            stenosis and has a bit ore dilation of biliary tree                            after cholecystectomy  though to my eye, comparing                            MR images not much different. She could have also                            passed the tiny suspected stone. Recommendation:           - Return patient to hospital ward for ongoing care.                           - Clear liquids only today and regular lower fat                            diet tomorrow if ok.                           If she does very well am not opposed to dc today                            but would make sure she feels fine.                           Message me if you dc her and I will arrange lab                            follow-up with me vs surgeon. Otherwise we will see                            in AM.                           Will monitor for resolution of abnl LFT over time                            and see how she does.  Someties LFTs bump up the day                            after an intervention like htis FYI                           I called and updated husband  Antimicrobials: None   Subjective: Feels sleepy several hours after ERCP. No abdominal pain. Mild nausea, no vomiting.   Objective: Vitals:   06/17/21 0706 06/17/21 0824 06/17/21 0832 06/17/21 0843  BP: 128/74 129/60 132/69 136/78  Pulse: 77 80 77 63  Resp: _0 Temp: (!) 97.1 F (36.2 C) 97.8 F (36.6 C)    TempSrc: Temporal Temporal    SpO2: 98% 99% 99% 98%  Weight: 69.7 kg     Height: _1  (1.549 m)       Intake/Output Summary (Last 24 hours) at 06/17/2021 0915 Last data filed at 06/17/2021 0814 Gross per 24 hour  Intake 1045.83 ml  Output 0 ml  Net 1045.83 ml   [employer]  06/16/21 0313 06/17/21 0706  Weight: 69.7 kg 69.7 kg    Gen: 30 y.o. female in no distress  Pulm: Non-labored breathing . Clear to auscultation bilaterally.  CV: Regular rate and rhythm. No murmur, rub, or gallop. No JVD, no pedal edema. GI: Abdomen soft, non-tender, non-distended, with normoactive bowel sounds. No organomegaly or masses felt. Ext: Warm, no deformities Skin: No rashes, lesions or ulcers Neuro: Alert and oriented. No focal neurological deficits. Psych: Judgement and insight appear normal. Mood & affect appropriate.   Data Reviewed: I have personally reviewed following labs and imaging studies  CBC: Recent Labs  Lab 06/10/21 1817 06/11/21 0633 06/12/21 0204 06/15/21 1537 06/16/21 0506 06/17/21 0110  WBC 12.5* 9.9 10.4 8.4 7.0 6.8  NEUTROABS 10.7*  --   --  6.3 4.4  --   HGB 12.6 12.0 12.1 13.5 11.8* 11.4*  HCT 37.7 36.6 37.0 40.3 36.2 34.7*  MCV 87.5 88.8 86.4 86.7 87.7 88.5  PLT 338 268 293 348 295 903   Basic Metabolic Panel: Recent Labs  Lab 06/11/21 0633 06/12/21 0204 06/15/21 1537 06/16/21 0506 06/17/21 0110  NA 137 133* 134* 136 134*  K 4.0 4.8 3.5 3.5 3.6  CL 103 103 98 101 103  CO2 _0 GLUCOSE 107* 134* 137* 112* 104*  BUN _1 <5*  CREATININE 0.65 0.78 0.65 0.92 0.64  CALCIUM 8.8* 8.9 9.4 9.0 8.8*  MG 2.1 2.1  --   --   --   PHOS 3.3 2.5  --   --   --    GFR: Estimated Creatinine Clearance: 92.7 mL/min (by C-G formula based on SCr of 0.64 mg/dL). Liver Function Tests: Recent Labs  Lab 06/11/21 0633 06/12/21 0204 06/15/21 1537 06/16/21 0506 06/17/21 0110  AST 290* 202* 141* 143* 146*  ALT 299* 295* 231* 226* 235*  ALKPHOS 101 116 171* 147* 145*  BILITOT 3.1* 2.9* 5.2* 3.8* 3.8*  PROT 7.0 7.4 8.1 6.5 6.1*  ALBUMIN 3.7 4.2 4.2 3.3* 3.1*   Recent Labs  Lab 06/10/21 1817 06/15/21 1537  LIPASE 30 39   No results for input(s): AMMONIA in the last 168 hours. Coagulation Profile: Recent Labs  Lab 06/15/21 1537  INR 0.9   Cardiac Enzymes: No results for input(s): CKTOTAL, CKMB, CKMBINDEX, TROPONINI in the last 168 hours. BNP (last 3 results) No results for input(s): PROBNP in the last 8760 hours. HbA1C: No results for input(s): HGBA1C in the last 72 hours. CBG: No results for input(s): GLUCAP in the last 168 hours. Lipid Profile: No results for input(s): CHOL, HDL, LDLCALC, TRIG, CHOLHDL, LDLDIRECT in the last 72 hours. Thyroid Function Tests: No results for input(s): TSH, T4TOTAL, FREET4, T3FREE, THYROIDAB in the last 72 hours. Anemia Panel: No results for input(s): VITAMINB12, FOLATE, FERRITIN, TIBC, IRON, RETICCTPCT in the last 72 hours. Urine analysis:    Component Value Date/Time   COLORURINE AMBER (A) 06/15/2021 1701   APPEARANCEUR CLEAR (A) 06/15/2021 1701   APPEARANCEUR Cloudy 11/17/2012 0933   LABSPEC 1.025 06/15/2021 1701   LABSPEC 1.026 11/17/2012 0933   PHURINE 6.0 06/15/2021 1701   GLUCOSEU NEGATIVE 06/15/2021 1701   GLUCOSEU Negative 11/17/2012 0933   HGBUR NEGATIVE 06/15/2021 1701   BILIRUBINUR LARGE (A) 06/15/2021 1701   BILIRUBINUR negative 08/15/2020 0919   BILIRUBINUR Negative 11/17/2012 0933   KETONESUR >160  (A) 06/15/2021 1701   PROTEINUR 30 (A) 06/15/2021 1701   UROBILINOGEN 0.2 08/15/2020 0919   NITRITE NEGATIVE 06/15/2021  [place] 06/15/2021 1701   LEUKOCYTESUR 3+ 11/17/2012 0933   Recent Results (from the past 240 hour(s))  Resp Panel by RT-PCR (Flu A&B, Covid) Nasopharyngeal Swab     Status: None   Collection Time: 06/10/21  8:07 PM   Specimen: Nasopharyngeal Swab; Nasopharyngeal(NP) swabs in vial transport medium  Result Value Ref Range Status   SARS Coronavirus 2 by RT PCR NEGATIVE NEGATIVE Final    Comment: (NOTE) SARS-CoV-2 target nucleic acids are NOT DETECTED.  The SARS-CoV-2 RNA is generally detectable in upper respiratory specimens during the acute phase of infection. The lowest concentration of SARS-CoV-2 viral copies this assay can detect is 138 copies/mL. A negative result does not preclude SARS-Cov-2 infection and should not be used as the sole basis for treatment or other patient management decisions. A negative result may occur with  improper specimen collection/handling, submission of specimen other than nasopharyngeal swab, presence of viral mutation(s) within the areas targeted by this assay, and inadequate number of viral copies(<138 copies/mL). A negative result must be combined with clinical observations, patient history, and epidemiological information. The expected result is Negative.  Fact Sheet for Patients:  EntrepreneurPulse.com.au  Fact Sheet for Healthcare Providers:  IncredibleEmployment.be  This test is no t yet approved or cleared by the [place] FDA and  has been authorized for detection and/or diagnosis of SARS-CoV-2 by FDA under an Emergency Use Authorization (EUA). This EUA will remain  in effect (meaning this test can be used) for the duration of the COVID-19 declaration under Section 564(b)(1) of the Act, 21 U.S.C.section 360bbb-3(b)(1), unless the authorization is terminated   or revoked sooner.       Influenza A by PCR NEGATIVE NEGATIVE Final   Influenza B by PCR NEGATIVE NEGATIVE Final    Comment: (NOTE) The Xpert Xpress SARS-CoV-2/FLU/RSV plus assay is intended as an aid in the diagnosis of influenza from Nasopharyngeal swab specimens and should not be used as a sole basis for treatment. Nasal washings and aspirates are unacceptable for Xpert Xpress SARS-CoV-2/FLU/RSV testing.  Fact Sheet for Patients: EntrepreneurPulse.com.au  Fact Sheet for Healthcare Providers: IncredibleEmployment.be  This test is not yet approved or cleared by the [place] FDA and has been authorized for detection and/or diagnosis of SARS-CoV-2 by FDA under an Emergency Use Authorization (EUA). This EUA will remain in effect (meaning this test can be used) for the duration of the COVID-19 declaration under Section 564(b)(1) of the Act, 21 U.S.C. section 360bbb-3(b)(1), unless the authorization is terminated or revoked.  Performed at [facility], [address] [place], [place] [address]   Resp Panel by RT-PCR (Flu A&B, Covid) Nasopharyngeal Swab     Status: None   Collection Time: 06/15/21  5:01 PM   Specimen: Nasopharyngeal Swab; Nasopharyngeal(NP) swabs in vial transport medium  Result Value Ref Range Status   SARS Coronavirus 2 by RT PCR NEGATIVE NEGATIVE Final    Comment: (NOTE) SARS-CoV-2 target nucleic acids are NOT DETECTED.  The SARS-CoV-2 RNA is generally detectable in upper respiratory specimens during the acute phase of infection. The lowest concentration of SARS-CoV-2 viral copies this assay can detect is 138 copies/mL. A negative result does not preclude SARS-Cov-2 infection and should not be used as the sole basis for treatment or other patient management decisions. A negative result may occur with  improper specimen collection/handling, submission of specimen other than nasopharyngeal swab, presence of  viral mutation(s) within the areas targeted by this assay, and inadequate number of viral  copies(<138 copies/mL). A negative result must be combined with clinical observations, patient history, and epidemiological information. The expected result is Negative.  Fact Sheet for Patients:  EntrepreneurPulse.com.au  Fact Sheet for Healthcare Providers:  IncredibleEmployment.be  This test is no t yet approved or cleared by the [place] FDA and  has been authorized for detection and/or diagnosis of SARS-CoV-2 by FDA under an Emergency Use Authorization (EUA). This EUA will remain  in effect (meaning this test can be used) for the duration of the COVID-19 declaration under Section 564(b)(1) of the Act, 21 U.S.C.section 360bbb-3(b)(1), unless the authorization is terminated  or revoked sooner.       Influenza A by PCR NEGATIVE NEGATIVE Final   Influenza B by PCR NEGATIVE NEGATIVE Final    Comment: (NOTE) The Xpert Xpress SARS-CoV-2/FLU/RSV plus assay is intended as an aid in the diagnosis of influenza from Nasopharyngeal swab specimens and should not be used as a sole basis for treatment. Nasal washings and aspirates are unacceptable for Xpert Xpress SARS-CoV-2/FLU/RSV testing.  Fact Sheet for Patients: EntrepreneurPulse.com.au  Fact Sheet for Healthcare Providers: IncredibleEmployment.be  This test is not yet approved or cleared by the [place] FDA and has been authorized for detection and/or diagnosis of SARS-CoV-2 by FDA under an Emergency Use Authorization (EUA). This EUA will remain in effect (meaning this test can be used) for the duration of the COVID-19 declaration under Section 564(b)(1) of the Act, 21 U.S.C. section 360bbb-3(b)(1), unless the authorization is terminated or revoked.  Performed at [facility], [address] [place], [place] [address]       Radiology  Studies: DG Chest 2 View  Result Date: 06/15/2021 CLINICAL DATA:  Per Triage: Patient had a cholecystectomy 5 days ago and is having chest pain that is persistent, vomiting and back pain. Patient states she is also being observed for jaundice. Patient appears in discomfort in triage. EXAM: CHEST - 2 VIEW COMPARISON:  None. FINDINGS: Normal heart, mediastinum and hila. Clear lungs.  No pleural effusion or pneumothorax. Skeletal structures are within normal limits. IMPRESSION: Normal chest radiographs. Electronically Signed   By: Lajean Manes M.D.   On: 06/15/2021 15:50   CT Angio Chest PE W and/or Wo Contrast  Result Date: 06/15/2021 CLINICAL DATA:  Patient had a cholecystectomy 5 days ago and is having chest pain, as well as vomiting and back pain and abdominal pain. Also reportedly has jaundice. EXAM: CT ANGIOGRAPHY CHEST CT ABDOMEN AND PELVIS WITH CONTRAST TECHNIQUE: Multidetector CT imaging of the chest was performed using the standard protocol during bolus administration of intravenous contrast. Multiplanar CT image reconstructions and MIPs were obtained to evaluate the vascular anatomy. Multidetector CT imaging of the abdomen and pelvis was performed using the standard protocol during bolus administration of intravenous contrast. CONTRAST:  71mL OMNIPAQUE IOHEXOL 350 MG/ML SOLN COMPARISON:  01/01/2010. Current right upper quadrant ultrasound. Recent MRI of the abdomen and MRCP. FINDINGS: CTA CHEST FINDINGS Cardiovascular: Pulmonary arteries are well opacified. There is no evidence of a pulmonary embolism. Normal heart and great vessels.  No pericardial effusion. Mediastinum/Nodes: Normal visualized thyroid. No neck base, axillary, mediastinal or hilar masses or enlarged lymph nodes. Normal trachea and esophagus. Lungs/Pleura: Clear lungs.  No pleural effusion or pneumothorax. Musculoskeletal: Normal. Review of the MIP images confirms the above findings. CT ABDOMEN and PELVIS FINDINGS Hepatobiliary:  Liver normal in size and attenuation. No masses. Gallbladder surgically absent. Mild intrahepatic bile duct dilation. Common bile duct measures a maximum of 8 mm,  demonstrating distal tapering. No evidence of a duct stone. There is no inflammation or fluid collection in the gallbladder fossa. Pancreas: Unremarkable. No pancreatic ductal dilatation or surrounding inflammatory changes. Spleen: Normal in size without focal abnormality. Adrenals/Urinary Tract: Adrenal glands are unremarkable. Kidneys are normal, without renal calculi, focal lesion, or hydronephrosis. Bladder is unremarkable. Stomach/Bowel: Stomach is within normal limits. Appendix appears normal. No evidence of bowel wall thickening, distention, or inflammatory changes. Vascular/Lymphatic: No vascular abnormality. No enlarged lymph nodes. Reproductive: Uterus and bilateral adnexa are unremarkable. Other: No hernia. No anterior abdominal wall collection. No ascites. Musculoskeletal: Mild lumbar levoscoliosis.  Otherwise unremarkable. Review of the MIP images confirms the above findings. IMPRESSION: CTA CHEST 1. Normal.  No evidence of a pulmonary embolism.  Clear lungs. CT ABDOMEN AND PELVIS 1. Mild intra and extrahepatic bile duct dilation without CT evidence of a biliary obstruction/common bile duct stone. 2. No gallbladder fossa fluid collection or other finding to suggest a complication of the recent cholecystectomy. 3. No other abnormalities within the abdomen or pelvis. Electronically Signed   By: Lajean Manes M.D.   On: 06/15/2021 18:11   CT ABDOMEN PELVIS W CONTRAST  Result Date: 06/15/2021 CLINICAL DATA:  Patient had a cholecystectomy 5 days ago and is having chest pain, as well as vomiting and back pain and abdominal pain. Also reportedly has jaundice. EXAM: CT ANGIOGRAPHY CHEST CT ABDOMEN AND PELVIS WITH CONTRAST TECHNIQUE: Multidetector CT imaging of the chest was performed using the standard protocol during bolus administration of  intravenous contrast. Multiplanar CT image reconstructions and MIPs were obtained to evaluate the vascular anatomy. Multidetector CT imaging of the abdomen and pelvis was performed using the standard protocol during bolus administration of intravenous contrast. CONTRAST:  80m OMNIPAQUE IOHEXOL 350 MG/ML SOLN COMPARISON:  01/01/2010. Current right upper quadrant ultrasound. Recent MRI of the abdomen and MRCP. FINDINGS: CTA CHEST FINDINGS Cardiovascular: Pulmonary arteries are well opacified. There is no evidence of a pulmonary embolism. Normal heart and great vessels.  No pericardial effusion. Mediastinum/Nodes: Normal visualized thyroid. No neck base, axillary, mediastinal or hilar masses or enlarged lymph nodes. Normal trachea and esophagus. Lungs/Pleura: Clear lungs.  No pleural effusion or pneumothorax. Musculoskeletal: Normal. Review of the MIP images confirms the above findings. CT ABDOMEN and PELVIS FINDINGS Hepatobiliary: Liver normal in size and attenuation. No masses. Gallbladder surgically absent. Mild intrahepatic bile duct dilation. Common bile duct measures a maximum of 8 mm, demonstrating distal tapering. No evidence of a duct stone. There is no inflammation or fluid collection in the gallbladder fossa. Pancreas: Unremarkable. No pancreatic ductal dilatation or surrounding inflammatory changes. Spleen: Normal in size without focal abnormality. Adrenals/Urinary Tract: Adrenal glands are unremarkable. Kidneys are normal, without renal calculi, focal lesion, or hydronephrosis. Bladder is unremarkable. Stomach/Bowel: Stomach is within normal limits. Appendix appears normal. No evidence of bowel wall thickening, distention, or inflammatory changes. Vascular/Lymphatic: No vascular abnormality. No enlarged lymph nodes. Reproductive: Uterus and bilateral adnexa are unremarkable. Other: No hernia. No anterior abdominal wall collection. No ascites. Musculoskeletal: Mild lumbar levoscoliosis.  Otherwise  unremarkable. Review of the MIP images confirms the above findings. IMPRESSION: CTA CHEST 1. Normal.  No evidence of a pulmonary embolism.  Clear lungs. CT ABDOMEN AND PELVIS 1. Mild intra and extrahepatic bile duct dilation without CT evidence of a biliary obstruction/common bile duct stone. 2. No gallbladder fossa fluid collection or other finding to suggest a complication of the recent cholecystectomy. 3. No other abnormalities within the abdomen or pelvis. Electronically Signed  By: Lajean Manes M.D.   On: 06/15/2021 18:11   MR ABDOMEN MRCP WO CONTRAST  Result Date: 06/16/2021 CLINICAL DATA:  Cholecystectomy 5 days ago with chest/abdominal pain. Vomiting. Back pain. Jaundice. EXAM: MRI ABDOMEN WITHOUT CONTRAST  (INCLUDING MRCP) TECHNIQUE: Multiplanar multisequence MR imaging of the abdomen was performed. Heavily T2-weighted images of the biliary and pancreatic ducts were obtained, and three-dimensional MRCP images were rendered by post processing. COMPARISON:  CT of 1 day prior FINDINGS: Lower chest: Normal heart size without pericardial or pleural effusion. Hepatobiliary: No focal liver lesion. Cholecystectomy, without postoperative fluid collection. The intrahepatic ducts are mildly dilated, example left hepatic duct at 6 mm on 33/14. The common duct is within normal limits after cholecystectomy, 8 mm on 30/14. Followed to the level of the ampulla. A tiny (1-2 mm) apparent filling defect on image 15/5, at or just above the level of the ampulla is not confirmed on other pulse sequences, including the dedicated MRCP images. Pancreas:  Normal, without mass or ductal dilatation. Spleen:  Normal in size, without focal abnormality. Adrenals/Urinary Tract: Normal adrenal glands. Normal kidneys, without hydronephrosis. Stomach/Bowel: Normal stomach and abdominal bowel loops. Vascular/Lymphatic: Normal caliber of the aorta and branch vessels. No retroperitoneal or retrocrural adenopathy. Other:  No ascites.  Musculoskeletal: Mild convex left lumbar spine curvature. IMPRESSION: 1. Status post cholecystectomy. Mild intrahepatic biliary duct dilatation. 1-2 mm focus of T2 hypointensity within the distal common duct, only identified on 1 series. Possibly artifactual. Given the intrahepatic biliary duct dilatation and provocative history, if clinically indicated, ERCP should be considered. 2. No postoperative fluid collection or other explanation for patient's symptoms Electronically Signed   By: Abigail Miyamoto M.D.   On: 06/16/2021 07:38   MR 3D Recon At Scanner  Result Date: 06/16/2021 CLINICAL DATA:  Cholecystectomy 5 days ago with chest/abdominal pain. Vomiting. Back pain. Jaundice. EXAM: MRI ABDOMEN WITHOUT CONTRAST  (INCLUDING MRCP) TECHNIQUE: Multiplanar multisequence MR imaging of the abdomen was performed. Heavily T2-weighted images of the biliary and pancreatic ducts were obtained, and three-dimensional MRCP images were rendered by post processing. COMPARISON:  CT of 1 day prior FINDINGS: Lower chest: Normal heart size without pericardial or pleural effusion. Hepatobiliary: No focal liver lesion. Cholecystectomy, without postoperative fluid collection. The intrahepatic ducts are mildly dilated, example left hepatic duct at 6 mm on 33/14. The common duct is within normal limits after cholecystectomy, 8 mm on 30/14. Followed to the level of the ampulla. A tiny (1-2 mm) apparent filling defect on image 15/5, at or just above the level of the ampulla is not confirmed on other pulse sequences, including the dedicated MRCP images. Pancreas:  Normal, without mass or ductal dilatation. Spleen:  Normal in size, without focal abnormality. Adrenals/Urinary Tract: Normal adrenal glands. Normal kidneys, without hydronephrosis. Stomach/Bowel: Normal stomach and abdominal bowel loops. Vascular/Lymphatic: Normal caliber of the aorta and branch vessels. No retroperitoneal or retrocrural adenopathy. Other:  No ascites.  Musculoskeletal: Mild convex left lumbar spine curvature. IMPRESSION: 1. Status post cholecystectomy. Mild intrahepatic biliary duct dilatation. 1-2 mm focus of T2 hypointensity within the distal common duct, only identified on 1 series. Possibly artifactual. Given the intrahepatic biliary duct dilatation and provocative history, if clinically indicated, ERCP should be considered. 2. No postoperative fluid collection or other explanation for patient's symptoms Electronically Signed   By: Abigail Miyamoto M.D.   On: 06/16/2021 07:38   DG ERCP  Result Date: 06/17/2021 CLINICAL DATA:  30 year old female with a history of choledocholithiasis EXAM: ERCP TECHNIQUE: Multiple  spot images obtained with the fluoroscopic device and submitted for interpretation post-procedure. FLUOROSCOPY TIME:  Fluoroscopy Time:  2 minutes 29 seconds COMPARISON:  None. FINDINGS: Limited intraoperative fluoroscopic spot images during ERCP. Initial image demonstrates endoscope projecting over the upper abdomen. There is then cannulation of the ampulla and retrograde infusion of contrast partially opacifying the extrahepatic biliary ducts. Rounded filling defects within the common bile duct may be air bubbles or debris/stones. Subsequently there is deployment of a retrieval balloon. IMPRESSION: Limited images during ERCP demonstrates treatment of choledocholithiasis by deployment of a retrieval balloon. Please refer to the dictated operative report for full details of intraoperative findings and procedure. Electronically Signed   By: Corrie Mckusick D.O.   On: 06/17/2021 08:28   US ABDOMEN LIMITED RUQ (LIVER/GB)  Result Date: 06/15/2021 CLINICAL DATA:  Elevated bilirubin. EXAM: ULTRASOUND ABDOMEN LIMITED RIGHT UPPER QUADRANT COMPARISON:  MRI abdomen with MRCP. FINDINGS: Gallbladder: Surgically absent since the prior MRI. Common bile duct: Diameter: 8 mm Liver: Normal liver size and overall echogenicity. No masses. There is intrahepatic bile  duct dilation. Portal vein is patent on color Doppler imaging with normal direction of blood flow towards the liver. Other: None. IMPRESSION: 1. Intra and extrahepatic bile duct dilation. Common duct mildly larger than a was the prior MRI an intrahepatic biliary dilation is new. Consider a distal common bile duct obstruction/stone, best assessed with either MRCP or ERCP. 2. No other abnormalities.  Status post cholecystectomy. Electronically Signed   By: Lajean Manes M.D.   On: 06/15/2021 17:07    Scheduled Meds:  indomethacin  100 mg Rectal Once   Continuous Infusions:   LOS: 1 day   Time spent: 25 minutes.  Patrecia Pour, MD [facility] [email or web address] 06/17/2021, 9:15 AM

## 2021-06-17 NOTE — Anesthesia Procedure Notes (Signed)
Procedure Name: Intubation Date/Time: 06/17/2021 7:33 AM Performed by: Stanton Kidney, CRNA Pre-anesthesia Checklist: Patient identified, Patient being monitored, Timeout performed, Emergency Drugs available and Suction available Patient Re-evaluated:Patient Re-evaluated prior to induction Oxygen Delivery Method: Circle system utilized Preoxygenation: Pre-oxygenation with 100% oxygen Induction Type: IV induction Ventilation: Mask ventilation without difficulty Laryngoscope Size: 3 and Miller Grade View: Grade I Tube type: Oral Tube size: 7.0 mm Number of attempts: 1 Airway Equipment and Method: Stylet Placement Confirmation: ETT inserted through vocal cords under direct vision, positive ETCO2 and breath sounds checked- equal and bilateral Secured at: 21 cm Tube secured with: Tape Dental Injury: Teeth and Oropharynx as per pre-operative assessment

## 2021-06-17 NOTE — Transfer of Care (Signed)
Immediate Anesthesia Transfer of Care Note  Patient: Natalie Welch  Procedure(s) Performed: ENDOSCOPIC RETROGRADE CHOLANGIOPANCREATOGRAPHY (ERCP) WITH PROPOFOL REMOVAL OF STONES SPHINCTEROTOMY  Patient Location: PACU  Anesthesia Type:General  Level of Consciousness: awake, alert , oriented and patient cooperative  Airway & Oxygen Therapy: Patient Spontanous Breathing  Post-op Assessment: Report given to RN and Post -op Vital signs reviewed and stable  Post vital signs: Reviewed and stable  Last Vitals:  Vitals Value Taken Time  BP 132/69 06/17/21 0832  Temp 36.6 C 06/17/21 0824  Pulse 70 06/17/21 0832  Resp 16 06/17/21 0832  SpO2 99 % 06/17/21 0832  Vitals shown include unvalidated device data.  Last Pain:  Vitals:   06/17/21 0832  TempSrc:   PainSc: 0-No pain         Complications: No notable events documented.

## 2021-06-17 NOTE — Interval H&P Note (Signed)
History and Physical Interval Note:  06/17/2021 7:25 AM  Natalie Welch  has presented today for surgery, with the diagnosis of Suspected CBD stone.  Persistent pain and elevated LFTs after laparoscopic cholecystectomy last week..  The various methods of treatment have been discussed with the patient and family. After consideration of risks, benefits and other options for treatment, the patient has consented to  Procedure(s): ENDOSCOPIC RETROGRADE CHOLANGIOPANCREATOGRAPHY (ERCP) WITH PROPOFOL (N/A) as a surgical intervention.  The patient's history has been reviewed, patient examined, no change in status, stable for surgery.  I have reviewed the patient's chart and labs.  Questions were answered to the patient's satisfaction.     Stan Head

## 2021-06-17 NOTE — Progress Notes (Signed)
                                                                                               Against Medical Advice  Patient at this time expresses desire to leave the Hospital immidiately, patient has been warned that this is not Medically advisable at this time, and can result in Medical complications like Death and Disability, patient understands and accepts the risks involved and assumes full responsibilty of this decision.  This patient has also been advised that if they feel the need for further medical assistance to return to any available ER or dial [phone].  Informed by Nursing staff that this patient has left care and has signed the form for   Against Medical Advice on 06/17/2021 @1912   BSN MSNA MSN ACNPC-AG Acute Care Nurse Practitioner [facility] [place]

## 2021-06-17 NOTE — Progress Notes (Signed)
Patient wants to sign herself out AMA.  MD notified.

## 2021-06-18 ENCOUNTER — Encounter (HOSPITAL_COMMUNITY): Payer: Self-pay | Admitting: Internal Medicine

## 2021-06-18 NOTE — Discharge Summary (Addendum)
Physician Discharge Summary  Natalie Welch GYB:[id number] DOB: 11-23-1990 DOA: 06/16/2021  PCP: [place] date: 06/16/2021 Discharge date: 06/17/2021  Admitted From: Home Disposition: Home   Recommendations for Outpatient Follow-up:  Follow up with surgery as planned next week with repeat CMP.  Home Health: None Equipment/Devices: None Discharge Condition: Stable CODE STATUS: Full Diet recommendation: As tolerated  Brief/Interim Summary: Natalie Welch is a 30 y.o. female with a history of anxiety, depression, LTCS 02/07/2020, HLD, gestational HTN, and acute cholecystitis s/p robotic-assisted laparoscopic cholecystectomy 06/11/2021 who returned to the ED at [facility] 8/20 with worsening epigastric pain radiating to the chest, jaundice, body-wide pruritus, and nausea with vomiting. She was afebrile with stable vital signs, LFTs persistently elevated with rise in TBili to 5.2 (from 2.9) and alk phos to 171 (from 116). CBC normal, troponin undetectable, ECG nonischemic. CT chest/abd/pelvis demonstrated mild intra- and extra-hepatic ductal dilatation without postoperative fluid collection or other complication. Dilatation was confirmed by RUQ U/S with CBD measuring 88m. [facility] MRCP showing 1-272mdistal CBD opacity concerning for choledocholithiasis. ERCP was performed 8/22 revealing dilated biliary tree with tapering at ampulla without stone noted. Sphincterotomy was performed and the patient was placed on a clear liquid diet. The option to discharge the day of the procedure was offered vs. observe overnight and the patient opted initially to stay the night. However, later through the day her pain improved, she tolerated a diet, and began having some anxiety about staying in the hospital. She requested to be discharged that evening and unfortunately was told she'd have to sign AMA paperwork and left before MD was able to speak with her. I  called her the following afternoon and she states she's feeling a thousand times better without pain, nausea, or any other concerns. She has follow up with her surgeon near her home and knows to present for any return of symptoms.   Discharge Diagnoses:  Principal Problem:   Abdominal pain Active Problems:   Generalized anxiety disorder with panic attacks   Elevated LFTs   Choledocholithiasis   Biliary obstruction  Cholestatic symptoms and LFT elevations: No choledocholithiasis seen at ERCP, sphincterotomy performed. Possibly ampullary stenosis +/- tiny choledocholithiasis which has passed prior to procedure could have caused this presentation. Negative intraoperative cholangiogram per op note 8/16 - Advance diet, has tolerated. Symptoms resolved. - GI, Dr. GeCarlean Purlwas notified of the patient's discharge the following day. She already has follow up with general surgery, so recommended she get LFTs checked there.    Chest pain: Low suspicion for angina with negative work up thus far. D-dimer is elevated, though without respiratory signs or symptoms, no LE swelling, and negative CTA chest, suspicion for VTE is low. - Resolved.   Depression:  - Restart home celexa    GERD:  - Restart home PPI  Discharge Instructions  Allergies as of 06/17/2021       Reactions   Abilify [aripiprazole] Other (See Comments)   Oculogyric crisis --eyes rolled in back of head   Ibuprofen Nausea And Vomiting   Only with higher strengths -- i.e., 60023m Nuvigil [armodafinil] Swelling   Tongue and throat swelling        Medication List     ASK your doctor about these medications    citalopram 20 MG tablet Commonly known as: CELEXA TAKE 1 TABLET BY MOUTH EVERY DAY   ibuprofen 600 MG tablet Commonly known as: ADVIL Take  1 tablet (600 mg total) by mouth every 6 (six) hours as needed for mild pain.   omeprazole 20 MG capsule Commonly known as: PRILOSEC Take 20 mg by mouth daily.   ondansetron  4 MG disintegrating tablet Commonly known as: Zofran ODT Take 1 tablet (4 mg total) by mouth every 8 (eight) hours as needed for nausea or vomiting.   oxyCODONE 5 MG immediate release tablet Commonly known as: Oxy IR/ROXICODONE Take 1 tablet (5 mg total) by mouth every 4 (four) hours as needed for severe pain or breakthrough pain.        Allergies  Allergen Reactions   Abilify [Aripiprazole] Other (See Comments)    Oculogyric crisis --eyes rolled in back of head   Ibuprofen Nausea And Vomiting    Only with higher strengths -- i.e., 699m   Nuvigil [Armodafinil] Swelling    Tongue and throat swelling    Consultations: GI, Dr. GCarlean Purl Procedures/Studies: DG Chest 2 View  Result Date: 06/15/2021 CLINICAL DATA:  Per Triage: Patient had a cholecystectomy 5 days ago and is having chest pain that is persistent, vomiting and back pain. Patient states she is also being observed for jaundice. Patient appears in discomfort in triage. EXAM: CHEST - 2 VIEW COMPARISON:  None. FINDINGS: Normal heart, mediastinum and hila. Clear lungs.  No pleural effusion or pneumothorax. Skeletal structures are within normal limits. IMPRESSION: Normal chest radiographs. Electronically Signed   By: DLajean ManesM.D.   On: 06/15/2021 15:50   CT Angio Chest PE W and/or Wo Contrast  Result Date: 06/15/2021 CLINICAL DATA:  Patient had a cholecystectomy 5 days ago and is having chest pain, as well as vomiting and back pain and abdominal pain. Also reportedly has jaundice. EXAM: CT ANGIOGRAPHY CHEST CT ABDOMEN AND PELVIS WITH CONTRAST TECHNIQUE: Multidetector CT imaging of the chest was performed using the standard protocol during bolus administration of intravenous contrast. Multiplanar CT image reconstructions and MIPs were obtained to evaluate the vascular anatomy. Multidetector CT imaging of the abdomen and pelvis was performed using the standard protocol during bolus administration of intravenous contrast.  CONTRAST:  836mOMNIPAQUE IOHEXOL 350 MG/ML SOLN COMPARISON:  01/01/2010. Current right upper quadrant ultrasound. Recent MRI of the abdomen and MRCP. FINDINGS: CTA CHEST FINDINGS Cardiovascular: Pulmonary arteries are well opacified. There is no evidence of a pulmonary embolism. Normal heart and great vessels.  No pericardial effusion. Mediastinum/Nodes: Normal visualized thyroid. No neck base, axillary, mediastinal or hilar masses or enlarged lymph nodes. Normal trachea and esophagus. Lungs/Pleura: Clear lungs.  No pleural effusion or pneumothorax. Musculoskeletal: Normal. Review of the MIP images confirms the above findings. CT ABDOMEN and PELVIS FINDINGS Hepatobiliary: Liver normal in size and attenuation. No masses. Gallbladder surgically absent. Mild intrahepatic bile duct dilation. Common bile duct measures a maximum of 8 mm, demonstrating distal tapering. No evidence of a duct stone. There is no inflammation or fluid collection in the gallbladder fossa. Pancreas: Unremarkable. No pancreatic ductal dilatation or surrounding inflammatory changes. Spleen: Normal in size without focal abnormality. Adrenals/Urinary Tract: Adrenal glands are unremarkable. Kidneys are normal, without renal calculi, focal lesion, or hydronephrosis. Bladder is unremarkable. Stomach/Bowel: Stomach is within normal limits. Appendix appears normal. No evidence of bowel wall thickening, distention, or inflammatory changes. Vascular/Lymphatic: No vascular abnormality. No enlarged lymph nodes. Reproductive: Uterus and bilateral adnexa are unremarkable. Other: No hernia. No anterior abdominal wall collection. No ascites. Musculoskeletal: Mild lumbar levoscoliosis.  Otherwise unremarkable. Review of the MIP images confirms the above findings. IMPRESSION: CTA  CHEST 1. Normal.  No evidence of a pulmonary embolism.  Clear lungs. CT ABDOMEN AND PELVIS 1. Mild intra and extrahepatic bile duct dilation without CT evidence of a biliary  obstruction/common bile duct stone. 2. No gallbladder fossa fluid collection or other finding to suggest a complication of the recent cholecystectomy. 3. No other abnormalities within the abdomen or pelvis. Electronically Signed   By: Lajean Manes M.D.   On: 06/15/2021 18:11   CT ABDOMEN PELVIS W CONTRAST  Result Date: 06/15/2021 CLINICAL DATA:  Patient had a cholecystectomy 5 days ago and is having chest pain, as well as vomiting and back pain and abdominal pain. Also reportedly has jaundice. EXAM: CT ANGIOGRAPHY CHEST CT ABDOMEN AND PELVIS WITH CONTRAST TECHNIQUE: Multidetector CT imaging of the chest was performed using the standard protocol during bolus administration of intravenous contrast. Multiplanar CT image reconstructions and MIPs were obtained to evaluate the vascular anatomy. Multidetector CT imaging of the abdomen and pelvis was performed using the standard protocol during bolus administration of intravenous contrast. CONTRAST:  98m OMNIPAQUE IOHEXOL 350 MG/ML SOLN COMPARISON:  01/01/2010. Current right upper quadrant ultrasound. Recent MRI of the abdomen and MRCP. FINDINGS: CTA CHEST FINDINGS Cardiovascular: Pulmonary arteries are well opacified. There is no evidence of a pulmonary embolism. Normal heart and great vessels.  No pericardial effusion. Mediastinum/Nodes: Normal visualized thyroid. No neck base, axillary, mediastinal or hilar masses or enlarged lymph nodes. Normal trachea and esophagus. Lungs/Pleura: Clear lungs.  No pleural effusion or pneumothorax. Musculoskeletal: Normal. Review of the MIP images confirms the above findings. CT ABDOMEN and PELVIS FINDINGS Hepatobiliary: Liver normal in size and attenuation. No masses. Gallbladder surgically absent. Mild intrahepatic bile duct dilation. Common bile duct measures a maximum of 8 mm, demonstrating distal tapering. No evidence of a duct stone. There is no inflammation or fluid collection in the gallbladder fossa. Pancreas:  Unremarkable. No pancreatic ductal dilatation or surrounding inflammatory changes. Spleen: Normal in size without focal abnormality. Adrenals/Urinary Tract: Adrenal glands are unremarkable. Kidneys are normal, without renal calculi, focal lesion, or hydronephrosis. Bladder is unremarkable. Stomach/Bowel: Stomach is within normal limits. Appendix appears normal. No evidence of bowel wall thickening, distention, or inflammatory changes. Vascular/Lymphatic: No vascular abnormality. No enlarged lymph nodes. Reproductive: Uterus and bilateral adnexa are unremarkable. Other: No hernia. No anterior abdominal wall collection. No ascites. Musculoskeletal: Mild lumbar levoscoliosis.  Otherwise unremarkable. Review of the MIP images confirms the above findings. IMPRESSION: CTA CHEST 1. Normal.  No evidence of a pulmonary embolism.  Clear lungs. CT ABDOMEN AND PELVIS 1. Mild intra and extrahepatic bile duct dilation without CT evidence of a biliary obstruction/common bile duct stone. 2. No gallbladder fossa fluid collection or other finding to suggest a complication of the recent cholecystectomy. 3. No other abnormalities within the abdomen or pelvis. Electronically Signed   By: DLajean ManesM.D.   On: 06/15/2021 18:11   MR ABDOMEN MRCP WO CONTRAST  Result Date: 06/16/2021 CLINICAL DATA:  Cholecystectomy 5 days ago with chest/abdominal pain. Vomiting. Back pain. Jaundice. EXAM: MRI ABDOMEN WITHOUT CONTRAST  (INCLUDING MRCP) TECHNIQUE: Multiplanar multisequence MR imaging of the abdomen was performed. Heavily T2-weighted images of the biliary and pancreatic ducts were obtained, and three-dimensional MRCP images were rendered by post processing. COMPARISON:  CT of 1 day prior FINDINGS: Lower chest: Normal heart size without pericardial or pleural effusion. Hepatobiliary: No focal liver lesion. Cholecystectomy, without postoperative fluid collection. The intrahepatic ducts are mildly dilated, example left hepatic duct at 6 mm  on 33/14.  The common duct is within normal limits after cholecystectomy, 8 mm on 30/14. Followed to the level of the ampulla. A tiny (1-2 mm) apparent filling defect on image 15/5, at or just above the level of the ampulla is not confirmed on other pulse sequences, including the dedicated MRCP images. Pancreas:  Normal, without mass or ductal dilatation. Spleen:  Normal in size, without focal abnormality. Adrenals/Urinary Tract: Normal adrenal glands. Normal kidneys, without hydronephrosis. Stomach/Bowel: Normal stomach and abdominal bowel loops. Vascular/Lymphatic: Normal caliber of the aorta and branch vessels. No retroperitoneal or retrocrural adenopathy. Other:  No ascites. Musculoskeletal: Mild convex left lumbar spine curvature. IMPRESSION: 1. Status post cholecystectomy. Mild intrahepatic biliary duct dilatation. 1-2 mm focus of T2 hypointensity within the distal common duct, only identified on 1 series. Possibly artifactual. Given the intrahepatic biliary duct dilatation and provocative history, if clinically indicated, ERCP should be considered. 2. No postoperative fluid collection or other explanation for patient's symptoms Electronically Signed   By: Abigail Miyamoto M.D.   On: 06/16/2021 07:38   MR 3D Recon At Scanner  Result Date: 06/16/2021 CLINICAL DATA:  Cholecystectomy 5 days ago with chest/abdominal pain. Vomiting. Back pain. Jaundice. EXAM: MRI ABDOMEN WITHOUT CONTRAST  (INCLUDING MRCP) TECHNIQUE: Multiplanar multisequence MR imaging of the abdomen was performed. Heavily T2-weighted images of the biliary and pancreatic ducts were obtained, and three-dimensional MRCP images were rendered by post processing. COMPARISON:  CT of 1 day prior FINDINGS: Lower chest: Normal heart size without pericardial or pleural effusion. Hepatobiliary: No focal liver lesion. Cholecystectomy, without postoperative fluid collection. The intrahepatic ducts are mildly dilated, example left hepatic duct at 6 mm on 33/14.  The common duct is within normal limits after cholecystectomy, 8 mm on 30/14. Followed to the level of the ampulla. A tiny (1-2 mm) apparent filling defect on image 15/5, at or just above the level of the ampulla is not confirmed on other pulse sequences, including the dedicated MRCP images. Pancreas:  Normal, without mass or ductal dilatation. Spleen:  Normal in size, without focal abnormality. Adrenals/Urinary Tract: Normal adrenal glands. Normal kidneys, without hydronephrosis. Stomach/Bowel: Normal stomach and abdominal bowel loops. Vascular/Lymphatic: Normal caliber of the aorta and branch vessels. No retroperitoneal or retrocrural adenopathy. Other:  No ascites. Musculoskeletal: Mild convex left lumbar spine curvature. IMPRESSION: 1. Status post cholecystectomy. Mild intrahepatic biliary duct dilatation. 1-2 mm focus of T2 hypointensity within the distal common duct, only identified on 1 series. Possibly artifactual. Given the intrahepatic biliary duct dilatation and provocative history, if clinically indicated, ERCP should be considered. 2. No postoperative fluid collection or other explanation for patient's symptoms Electronically Signed   By: Abigail Miyamoto M.D.   On: 06/16/2021 07:38   MR 3D Recon At Scanner  Result Date: 06/11/2021 CLINICAL DATA:  Fall cough EXAM: MRI ABDOMEN WITHOUT AND WITH CONTRAST (INCLUDING MRCP) TECHNIQUE: Multiplanar multisequence MR imaging of the abdomen was performed both before and after the administration of intravenous contrast. Heavily T2-weighted images of the biliary and pancreatic ducts were obtained, and three-dimensional MRCP images were rendered by post processing. CONTRAST:  59m GADAVIST GADOBUTROL 1 MMOL/ML IV SOLN COMPARISON:  None. FINDINGS: Lower chest:  Lung bases are clear. Hepatobiliary: No intrahepatic biliary duct dilatation. No extrahepatic biliary duct dilatation. Common bile duct measures 6 to 7 mm. No filling defect within distal common bile duct. There  are multiple fairly large gallstones measuring 9 mm each. Approximately 10 stones in the gallbladder. No gallbladder wall thickening or distention. No hepatic steatosis.  No  focal hepatic lesion. Pancreas: Normal pancreatic parenchymal intensity. No ductal dilatation or inflammation. No variant pancreatic ductal anatomy. Spleen: Normal spleen. Adrenals/urinary tract: Adrenal glands and kidneys are normal. Stomach/Bowel: Stomach and limited of the small bowel is unremarkable Vascular/Lymphatic: Abdominal aortic normal caliber. No retroperitoneal periportal lymphadenopathy. Musculoskeletal: No aggressive osseous lesion IMPRESSION: 1. Cholelithiasis without evidence of cholecystitis. 2. Common bile duct upper limits of normal.  No choledocholithiasis. 3. Normal liver and pancreas. Electronically Signed   By: Suzy Bouchard M.D.   On: 06/11/2021 09:34   DG ERCP  Result Date: 06/17/2021 CLINICAL DATA:  30 year old female with a history of choledocholithiasis EXAM: ERCP TECHNIQUE: Multiple spot images obtained with the fluoroscopic device and submitted for interpretation post-procedure. FLUOROSCOPY TIME:  Fluoroscopy Time:  2 minutes 29 seconds COMPARISON:  None. FINDINGS: Limited intraoperative fluoroscopic spot images during ERCP. Initial image demonstrates endoscope projecting over the upper abdomen. There is then cannulation of the ampulla and retrograde infusion of contrast partially opacifying the extrahepatic biliary ducts. Rounded filling defects within the common bile duct may be air bubbles or debris/stones. Subsequently there is deployment of a retrieval balloon. IMPRESSION: Limited images during ERCP demonstrates treatment of choledocholithiasis by deployment of a retrieval balloon. Please refer to the dictated operative report for full details of intraoperative findings and procedure. Electronically Signed   By: Corrie Mckusick D.O.   On: 06/17/2021 08:28   MR ABDOMEN MRCP W WO CONTAST  Result Date:  06/11/2021 CLINICAL DATA:  Fall cough EXAM: MRI ABDOMEN WITHOUT AND WITH CONTRAST (INCLUDING MRCP) TECHNIQUE: Multiplanar multisequence MR imaging of the abdomen was performed both before and after the administration of intravenous contrast. Heavily T2-weighted images of the biliary and pancreatic ducts were obtained, and three-dimensional MRCP images were rendered by post processing. CONTRAST:  70m GADAVIST GADOBUTROL 1 MMOL/ML IV SOLN COMPARISON:  None. FINDINGS: Lower chest:  Lung bases are clear. Hepatobiliary: No intrahepatic biliary duct dilatation. No extrahepatic biliary duct dilatation. Common bile duct measures 6 to 7 mm. No filling defect within distal common bile duct. There are multiple fairly large gallstones measuring 9 mm each. Approximately 10 stones in the gallbladder. No gallbladder wall thickening or distention. No hepatic steatosis.  No focal hepatic lesion. Pancreas: Normal pancreatic parenchymal intensity. No ductal dilatation or inflammation. No variant pancreatic ductal anatomy. Spleen: Normal spleen. Adrenals/urinary tract: Adrenal glands and kidneys are normal. Stomach/Bowel: Stomach and limited of the small bowel is unremarkable Vascular/Lymphatic: Abdominal aortic normal caliber. No retroperitoneal periportal lymphadenopathy. Musculoskeletal: No aggressive osseous lesion IMPRESSION: 1. Cholelithiasis without evidence of cholecystitis. 2. Common bile duct upper limits of normal.  No choledocholithiasis. 3. Normal liver and pancreas. Electronically Signed   By: SSuzy BouchardM.D.   On: 06/11/2021 09:34   [place]ABDOMEN LIMITED RUQ (LIVER/GB)  Result Date: 06/15/2021 CLINICAL DATA:  Elevated bilirubin. EXAM: ULTRASOUND ABDOMEN LIMITED RIGHT UPPER QUADRANT COMPARISON:  MRI abdomen with MRCP. FINDINGS: Gallbladder: Surgically absent since the prior MRI. Common bile duct: Diameter: 8 mm Liver: Normal liver size and overall echogenicity. No masses. There is intrahepatic bile duct dilation.  Portal vein is patent on color Doppler imaging with normal direction of blood flow towards the liver. Other: None. IMPRESSION: 1. Intra and extrahepatic bile duct dilation. Common duct mildly larger than a was the prior MRI an intrahepatic biliary dilation is new. Consider a distal common bile duct obstruction/stone, best assessed with either MRCP or ERCP. 2. No other abnormalities.  Status post cholecystectomy. Electronically Signed   By: DLajean Manes  M.D.   On: 06/15/2021 17:07   US Abdomen Limited RUQ (LIVER/GB)  Result Date: 06/10/2021 CLINICAL DATA:  Right upper quadrant pain. EXAM: ULTRASOUND ABDOMEN LIMITED RIGHT UPPER QUADRANT COMPARISON:  None. FINDINGS: Gallbladder: Multiple shadowing echogenic gallstones are seen within the gallbladder lumen. The largest measures approximately 10 mm. Gallbladder wall thickening is seen (10 mm). With fluid seen dissecting the gallbladder wall at this level. A positive sonographic Murphy's sign is noted by the sonographer. Common bile duct: Diameter: 6.7 mm Liver: No focal lesion identified. Diffusely increased echogenicity of the liver parenchyma is noted. Intrahepatic biliary dilatation is seen. Portal vein is patent on color Doppler imaging with normal direction of blood flow towards the liver. Other: None. IMPRESSION: 1. Cholelithiasis with additional findings consistent with acute cholecystitis. 2. Fatty liver. Electronically Signed   By: Virgina Norfolk M.D.   On: 06/10/2021 19:33    ERCP Dr. Carlean Purl 06/17/2021: Impression:               - The entire biliary tree was mildly dilated. I did                            not see any stones. There was tapering at the                            ampulla and I wonder if she has/had papillary                            stenosis and has a bit ore dilation of biliary tree                            after cholecystectomy though to my eye, comparing                            MR images not much different. She could  have also                            passed the tiny suspected stone. Recommendation:           - Return patient to hospital ward for ongoing care.                           - Clear liquids only today and regular lower fat                            diet tomorrow if ok.                           If she does very well am not opposed to dc today                            but would make sure she feels fine.                           Message me if you dc her and I will arrange lab  follow-up with me vs surgeon. Otherwise we will see                            in AM.                           Will monitor for resolution of abnl LFT over time                            and see how she does. Someties LFTs bump up the day                            after an intervention like htis FYI                           I called and updated husband  Subjective: Feels sleepy several hours after ERCP. No abdominal pain. Mild nausea, no vomiting. Later tolerated diet, felt fine.  Discharge Exam: Vitals:   06/17/21 0843 06/17/21 1138  BP: 136/78 123/80  Pulse: 63 (!) 59  Resp: 20 16  Temp:  98.4 F (36.9 C)  SpO2: 98% 98%   General: Pt is alert, awake, not in acute distress Cardiovascular: RRR, S1/S2 +, no rubs, no gallops Respiratory: CTA bilaterally, no wheezing, no rhonchi Abdominal: Soft, NT, ND, bowel sounds + Extremities: No edema, no cyanosis  Labs: BNP (last 3 results) No results for input(s): BNP in the last 8760 hours. Basic Metabolic Panel: Recent Labs  Lab 06/12/21 0204 06/15/21 1537 06/16/21 0506 06/17/21 0110  NA 133* 134* 136 134*  K 4.8 3.5 3.5 3.6  CL 103 98 101 103  CO2 23 24 28 24   GLUCOSE 134* 137* 112* 104*  BUN 8 13 8  <5*  CREATININE 0.78 0.65 0.92 0.64  CALCIUM 8.9 9.4 9.0 8.8*  MG 2.1  --   --   --   PHOS 2.5  --   --   --    Liver Function Tests: Recent Labs  Lab 06/12/21 0204 06/15/21 1537 06/16/21 0506 06/17/21 0110  AST  202* 141* 143* 146*  ALT 295* 231* 226* 235*  ALKPHOS 116 171* 147* 145*  BILITOT 2.9* 5.2* 3.8* 3.8*  PROT 7.4 8.1 6.5 6.1*  ALBUMIN 4.2 4.2 3.3* 3.1*   Recent Labs  Lab 06/15/21 1537  LIPASE 39   No results for input(s): AMMONIA in the last 168 hours. CBC: Recent Labs  Lab 06/12/21 0204 06/15/21 1537 06/16/21 0506 06/17/21 0110  WBC 10.4 8.4 7.0 6.8  NEUTROABS  --  6.3 4.4  --   HGB 12.1 13.5 11.8* 11.4*  HCT 37.0 40.3 36.2 34.7*  MCV 86.4 86.7 87.7 88.5  PLT 293 348 295 249   Cardiac Enzymes: No results for input(s): CKTOTAL, CKMB, CKMBINDEX, TROPONINI in the last 168 hours. BNP: Invalid input(s): POCBNP CBG: No results for input(s): GLUCAP in the last 168 hours. D-Dimer Recent Labs    06/15/21 1537  DDIMER 2.31*   Hgb A1c No results for input(s): HGBA1C in the last 72 hours. Lipid Profile No results for input(s): CHOL, HDL, LDLCALC, TRIG, CHOLHDL, LDLDIRECT in the last 72 hours. Thyroid function studies No results for input(s): TSH, T4TOTAL, T3FREE, THYROIDAB in the last 72 hours.  Invalid input(s): FREET3 Anemia work up No results for  input(s): VITAMINB12, FOLATE, FERRITIN, TIBC, IRON, RETICCTPCT in the last 72 hours. Urinalysis    Component Value Date/Time   COLORURINE AMBER (A) 06/15/2021 1701   APPEARANCEUR CLEAR (A) 06/15/2021 1701   APPEARANCEUR Cloudy 11/17/2012 0933   LABSPEC 1.025 06/15/2021 1701   LABSPEC 1.026 11/17/2012 0933   PHURINE 6.0 06/15/2021 1701   GLUCOSEU NEGATIVE 06/15/2021 1701   GLUCOSEU Negative 11/17/2012 0933   HGBUR NEGATIVE 06/15/2021 1701   BILIRUBINUR LARGE (A) 06/15/2021 1701   BILIRUBINUR negative 08/15/2020 0919   BILIRUBINUR Negative 11/17/2012 0933   KETONESUR >160 (A) 06/15/2021 1701   PROTEINUR 30 (A) 06/15/2021 1701   UROBILINOGEN 0.2 08/15/2020 0919   NITRITE NEGATIVE 06/15/2021 1701   LEUKOCYTESUR NEGATIVE 06/15/2021 1701   LEUKOCYTESUR 3+ 11/17/2012 0933    Microbiology Recent Results (from the  past 240 hour(s))  Resp Panel by RT-PCR (Flu A&B, Covid) Nasopharyngeal Swab     Status: None   Collection Time: 06/10/21  8:07 PM   Specimen: Nasopharyngeal Swab; Nasopharyngeal(NP) swabs in vial transport medium  Result Value Ref Range Status   SARS Coronavirus 2 by RT PCR NEGATIVE NEGATIVE Final    Comment: (NOTE) SARS-CoV-2 target nucleic acids are NOT DETECTED.  The SARS-CoV-2 RNA is generally detectable in upper respiratory specimens during the acute phase of infection. The lowest concentration of SARS-CoV-2 viral copies this assay can detect is 138 copies/mL. A negative result does not preclude SARS-Cov-2 infection and should not be used as the sole basis for treatment or other patient management decisions. A negative result may occur with  improper specimen collection/handling, submission of specimen other than nasopharyngeal swab, presence of viral mutation(s) within the areas targeted by this assay, and inadequate number of viral copies(<138 copies/mL). A negative result must be combined with clinical observations, patient history, and epidemiological information. The expected result is Negative.  Fact Sheet for Patients:  EntrepreneurPulse.com.au  Fact Sheet for Healthcare Providers:  IncredibleEmployment.be  This test is no t yet approved or cleared by the [place] FDA and  has been authorized for detection and/or diagnosis of SARS-CoV-2 by FDA under an Emergency Use Authorization (EUA). This EUA will remain  in effect (meaning this test can be used) for the duration of the COVID-19 declaration under Section 564(b)(1) of the Act, 21 U.S.C.section 360bbb-3(b)(1), unless the authorization is terminated  or revoked sooner.       Influenza A by PCR NEGATIVE NEGATIVE Final   Influenza B by PCR NEGATIVE NEGATIVE Final    Comment: (NOTE) The Xpert Xpress SARS-CoV-2/FLU/RSV plus assay is intended as an aid in the diagnosis of  influenza from Nasopharyngeal swab specimens and should not be used as a sole basis for treatment. Nasal washings and aspirates are unacceptable for Xpert Xpress SARS-CoV-2/FLU/RSV testing.  Fact Sheet for Patients: EntrepreneurPulse.com.au  Fact Sheet for Healthcare Providers: IncredibleEmployment.be  This test is not yet approved or cleared by the [place] FDA and has been authorized for detection and/or diagnosis of SARS-CoV-2 by FDA under an Emergency Use Authorization (EUA). This EUA will remain in effect (meaning this test can be used) for the duration of the COVID-19 declaration under Section 564(b)(1) of the Act, 21 U.S.C. section 360bbb-3(b)(1), unless the authorization is terminated or revoked.  Performed at [facility], [address] [place], [place] [address]   Resp Panel by RT-PCR (Flu A&B, Covid) Nasopharyngeal Swab     Status: None   Collection Time: 06/15/21  5:01 PM   Specimen: Nasopharyngeal Swab; Nasopharyngeal(NP) swabs  in vial transport medium  Result Value Ref Range Status   SARS Coronavirus 2 by RT PCR NEGATIVE NEGATIVE Final    Comment: (NOTE) SARS-CoV-2 target nucleic acids are NOT DETECTED.  The SARS-CoV-2 RNA is generally detectable in upper respiratory specimens during the acute phase of infection. The lowest concentration of SARS-CoV-2 viral copies this assay can detect is 138 copies/mL. A negative result does not preclude SARS-Cov-2 infection and should not be used as the sole basis for treatment or other patient management decisions. A negative result may occur with  improper specimen collection/handling, submission of specimen other than nasopharyngeal swab, presence of viral mutation(s) within the areas targeted by this assay, and inadequate number of viral copies(<138 copies/mL). A negative result must be combined with clinical observations, patient history, and epidemiological information.  The expected result is Negative.  Fact Sheet for Patients:  EntrepreneurPulse.com.au  Fact Sheet for Healthcare Providers:  IncredibleEmployment.be  This test is no t yet approved or cleared by the [place] FDA and  has been authorized for detection and/or diagnosis of SARS-CoV-2 by FDA under an Emergency Use Authorization (EUA). This EUA will remain  in effect (meaning this test can be used) for the duration of the COVID-19 declaration under Section 564(b)(1) of the Act, 21 U.S.C.section 360bbb-3(b)(1), unless the authorization is terminated  or revoked sooner.       Influenza A by PCR NEGATIVE NEGATIVE Final   Influenza B by PCR NEGATIVE NEGATIVE Final    Comment: (NOTE) The Xpert Xpress SARS-CoV-2/FLU/RSV plus assay is intended as an aid in the diagnosis of influenza from Nasopharyngeal swab specimens and should not be used as a sole basis for treatment. Nasal washings and aspirates are unacceptable for Xpert Xpress SARS-CoV-2/FLU/RSV testing.  Fact Sheet for Patients: EntrepreneurPulse.com.au  Fact Sheet for Healthcare Providers: IncredibleEmployment.be  This test is not yet approved or cleared by the [place] FDA and has been authorized for detection and/or diagnosis of SARS-CoV-2 by FDA under an Emergency Use Authorization (EUA). This EUA will remain in effect (meaning this test can be used) for the duration of the COVID-19 declaration under Section 564(b)(1) of the Act, 21 U.S.C. section 360bbb-3(b)(1), unless the authorization is terminated or revoked.  Performed at [facility], [address] [place], [place] [address]     Time coordinating discharge: Approximately 40 minutes  Patrecia Pour, MD  [facility] 06/18/2021, 3:23 PM

## 2021-06-27 ENCOUNTER — Other Ambulatory Visit: Payer: Self-pay

## 2021-06-27 ENCOUNTER — Encounter: Payer: Self-pay | Admitting: General Surgery

## 2021-06-27 ENCOUNTER — Ambulatory Visit (INDEPENDENT_AMBULATORY_CARE_PROVIDER_SITE_OTHER): Payer: BC Managed Care – PPO | Admitting: General Surgery

## 2021-06-27 VITALS — BP 103/70 | HR 93 | Temp 98.9°F | Ht 61.0 in | Wt 150.2 lb

## 2021-06-27 DIAGNOSIS — Z9049 Acquired absence of other specified parts of digestive tract: Secondary | ICD-10-CM

## 2021-06-27 NOTE — Patient Instructions (Addendum)
If you have any concern or questions, please feel free to call our office. Follow up as needed.  GENERAL POST-OPERATIVE PATIENT INSTRUCTIONS   WOUND CARE INSTRUCTIONS:  Keep a dry clean dressing on the wound if there is drainage. The initial bandage may be removed after 24 hours.  Once the wound has quit draining you may leave it open to air.  If clothing rubs against the wound or causes irritation and the wound is not draining you may cover it with a dry dressing during the daytime.  Try to keep the wound dry and avoid ointments on the wound unless directed to do so.  If the wound becomes bright red and painful or starts to drain infected material that is not clear, please contact your physician immediately.  If the wound is mildly pink and has a thick firm ridge underneath it, this is normal, and is referred to as a healing ridge.  This will resolve over the next 4-6 weeks.  BATHING: You may shower if you have been informed of this by your surgeon. However, Please do not submerge in a tub, hot tub, or pool until incisions are completely sealed or have been told by your surgeon that you may do so.  DIET:  You may eat any foods that you can tolerate.  It is a good idea to eat a high fiber diet and take in plenty of fluids to prevent constipation.  If you do become constipated you may want to take a mild laxative or take ducolax tablets on a daily basis until your bowel habits are regular.  Constipation can be very uncomfortable, along with straining, after recent surgery.  ACTIVITY:  You are encouraged to cough and deep breath or use your incentive spirometer if you were given one, every 15-30 minutes when awake.  This will help prevent respiratory complications and low grade fevers post-operatively if you had a general anesthetic.  You may want to hug a pillow when coughing and sneezing to add additional support to the surgical area, if you had abdominal or chest surgery, which will decrease pain during  these times.  You are encouraged to walk and engage in light activity for the next two weeks.  You should not lift more than 10-15 pounds, until 07/12/2021 as it could put you at increased risk for complications.  Twenty pounds is roughly equivalent to a plastic bag of groceries. At that time- Listen to your body when lifting, if you have pain when lifting, stop and then try again in a few days. Soreness after doing exercises or activities of daily living is normal as you get back in to your normal routine.  MEDICATIONS:  Try to take narcotic medications and anti-inflammatory medications, such as tylenol, ibuprofen, naprosyn, etc., with food.  This will minimize stomach upset from the medication.  Should you develop nausea and vomiting from the pain medication, or develop a rash, please discontinue the medication and contact your physician.  You should not drive, make important decisions, or operate machinery when taking narcotic pain medication.  SUNBLOCK Use sun block to incision area over the next year if this area will be exposed to sun. This helps decrease scarring and will allow you avoid a permanent darkened area over your incision.   Gallbladder Eating Plan  If you have a gallbladder condition, you may have trouble digesting fats. Eating a low-fat diet can help reduce your symptoms, and may be helpful before and after having surgery to remove your gallbladder (  cholecystectomy). Your health care provider may recommend that you work with a diet and nutrition specialist (dietitian) to help you reduce the amount of fat in your diet. What are tips for following this plan? General guidelines Limit your fat intake to less than 30% of your total daily calories. If you eat around 1,800 calories each day, this is less than 60 grams (g) of fat per day. Fat is an important part of a healthy diet. Eating a low-fat diet can make it hard to maintain a healthy body weight. Ask your dietitian how much fat,  calories, and other nutrients you need each day. Eat small, frequent meals throughout the day instead of three large meals. Drink at least 8-10 cups of fluid a day. Drink enough fluid to keep your urine clear or pale yellow. Limit alcohol intake to no more than 1 drink a day for nonpregnant women and 2 drinks a day for men. One drink equals 12 oz of beer, 5 oz of wine, or 1 oz of hard liquor. Reading food labels  Check Nutrition Facts on food labels for the amount of fat per serving. Choose foods with less than 3 grams of fat per serving. Shopping Choose nonfat and low-fat healthy foods. Look for the words "nonfat," "low fat," or "fat free." Avoid buying processed or prepackaged foods. Cooking Cook using low-fat methods, such as baking, broiling, grilling, or boiling. Cook with small amounts of healthy fats, such as olive oil, grapeseed oil, canola oil, or sunflower oil. What foods are recommended? All fresh, frozen, or canned fruits and vegetables. Whole grains. Low-fat or non-fat (skim) milk and yogurt. Lean meat, skinless poultry, fish, eggs, and beans. Low-fat protein supplement powders or drinks. Spices and herbs. What foods are not recommended? High-fat foods. These include baked goods, fast food, fatty cuts of meat, ice cream, french toast, sweet rolls, pizza, cheese bread, foods covered with butter, creamy sauces, or cheese. Fried foods. These include french fries, tempura, battered fish, breaded chicken, fried breads, and sweets. Foods with strong odors. Foods that cause bloating and gas. Summary A low-fat diet can be helpful if you have a gallbladder condition, or before and after gallbladder surgery. Limit your fat intake to less than 30% of your total daily calories. This is about 60 g of fat if you eat 1,800 calories each day. Eat small, frequent meals throughout the day instead of three large meals. This information is not intended to replace advice given to you by your  health care provider. Make sure you discuss any questions you have with your health care provider. Document Revised: 05/31/2020 Document Reviewed: 05/31/2020 Elsevier Patient Education  2022 [employer].

## 2021-06-27 NOTE — Progress Notes (Signed)
Natalie Welch is here today for a postoperative visit.  She is a 30 year old woman who underwent robot-assisted laparoscopic cholecystectomy on June 10, 2021.  She returned to the emergency room on August 20 with jaundice and worsening abdominal pain.  MRCP did not show any obvious obstruction, but did show some mild dilation of her common bile duct.  Due to lack of ERCP capability at [facility], she was transferred to [facility] where she underwent ERCP.  They did not appreciate any stones in her duct, but she reported feeling significantly better after the procedure.  Her jaundice has resolved.  She denies any nausea or vomiting.  No fevers or chills.  She has had some occasional episodes of diarrhea, but as she is normally quite constipated, she does not find this particularly troublesome.  She continues to have some discomfort at the 12 mm trocar site, where the gallbladder was extracted.  Today's Vitals   06/27/21 0917  BP: 103/70  Pulse: 93  Temp: 98.9 F (37.2 C)  TempSrc: Oral  SpO2: 96%  Weight: 150 lb 3.2 oz (68.1 kg)  Height: 5\' 1"  (1.549 m)   Body mass index is 28.38 kg/m. Focused examination: Scleral icterus has resolved.  Skin is no longer jaundiced.  Laparoscopic trocar sites are healing well without evidence of infection.  Impression and plan: This is a 30 year old woman who had acute cholecystitis and underwent robot-assisted laparoscopic cholecystectomy.  Her postoperative course was complicated by apparent obstruction of her common bile duct, likely with sludge, as no stone was appreciated on imaging or ERCP.  This has resolved and she is doing well.  She may resume all of her usual activities without restriction.  She was advised, that if her diarrhea becomes troublesome, she could take over-the-counter Imodium to help alleviate this.  I will see her on an as-needed basis.

## 2021-07-16 ENCOUNTER — Encounter: Payer: Self-pay | Admitting: General Surgery

## 2021-07-17 ENCOUNTER — Other Ambulatory Visit: Payer: Self-pay | Admitting: Obstetrics and Gynecology

## 2021-07-17 DIAGNOSIS — F32A Depression, unspecified: Secondary | ICD-10-CM

## 2021-08-06 ENCOUNTER — Other Ambulatory Visit: Payer: Self-pay | Admitting: Obstetrics and Gynecology

## 2021-08-06 ENCOUNTER — Telehealth: Payer: Self-pay

## 2021-08-06 MED ORDER — FLUCONAZOLE 150 MG PO TABS: 150.0000 mg | ORAL_TABLET | Freq: Once | ORAL | 0 refills | Status: AC

## 2021-08-06 NOTE — Telephone Encounter (Signed)
What sx is she having? Just to make sure we're treating correct issue. Thx.

## 2021-08-06 NOTE — Telephone Encounter (Signed)
Pt is going to [place] in a couple days, has a yeast infection, wanting to have a diflucan sent in, she has used monistat with no relief..Ams is not in the office today or [place]. Can you send in for her?

## 2021-08-06 NOTE — Progress Notes (Signed)
Rx diflucan for yeast vag sx, not improved with monistat

## 2021-08-06 NOTE — Telephone Encounter (Signed)
Rx diflucan eRxd. Will take 24 hrs to start to work. OTC hydrocortisone crm ext for sx in meantime. If still sx, needs appt.

## 2021-08-06 NOTE — Telephone Encounter (Signed)
Burning and itching

## 2021-08-06 NOTE — Telephone Encounter (Signed)
Pt aware.

## 2021-08-15 ENCOUNTER — Other Ambulatory Visit: Payer: Self-pay | Admitting: Obstetrics and Gynecology

## 2021-08-15 DIAGNOSIS — F419 Anxiety disorder, unspecified: Secondary | ICD-10-CM

## 2021-08-16 ENCOUNTER — Encounter: Payer: Self-pay | Admitting: Adult Health

## 2022-01-20 DIAGNOSIS — Z1331 Encounter for screening for depression: Secondary | ICD-10-CM | POA: Diagnosis not present

## 2022-01-20 DIAGNOSIS — Z124 Encounter for screening for malignant neoplasm of cervix: Secondary | ICD-10-CM | POA: Diagnosis not present

## 2022-01-20 DIAGNOSIS — Z1339 Encounter for screening examination for other mental health and behavioral disorders: Secondary | ICD-10-CM | POA: Diagnosis not present

## 2022-01-20 DIAGNOSIS — Z01419 Encounter for gynecological examination (general) (routine) without abnormal findings: Secondary | ICD-10-CM | POA: Diagnosis not present

## 2022-08-27 DIAGNOSIS — N912 Amenorrhea, unspecified: Secondary | ICD-10-CM | POA: Diagnosis not present

## 2022-08-27 DIAGNOSIS — O210 Mild hyperemesis gravidarum: Secondary | ICD-10-CM | POA: Diagnosis not present

## 2022-09-24 DIAGNOSIS — Z3481 Encounter for supervision of other normal pregnancy, first trimester: Secondary | ICD-10-CM | POA: Diagnosis not present

## 2022-10-07 DIAGNOSIS — Z3A13 13 weeks gestation of pregnancy: Secondary | ICD-10-CM | POA: Diagnosis not present

## 2022-10-07 DIAGNOSIS — R0602 Shortness of breath: Secondary | ICD-10-CM | POA: Diagnosis not present

## 2022-10-07 DIAGNOSIS — R002 Palpitations: Secondary | ICD-10-CM | POA: Diagnosis not present

## 2022-10-07 DIAGNOSIS — R000 Tachycardia, unspecified: Secondary | ICD-10-CM | POA: Diagnosis not present

## 2022-10-16 DIAGNOSIS — R000 Tachycardia, unspecified: Secondary | ICD-10-CM | POA: Diagnosis not present

## 2022-10-16 DIAGNOSIS — R002 Palpitations: Secondary | ICD-10-CM | POA: Diagnosis not present

## 2022-10-22 DIAGNOSIS — R000 Tachycardia, unspecified: Secondary | ICD-10-CM | POA: Diagnosis not present

## 2022-10-22 DIAGNOSIS — R002 Palpitations: Secondary | ICD-10-CM | POA: Diagnosis not present

## 2022-10-22 DIAGNOSIS — Z3482 Encounter for supervision of other normal pregnancy, second trimester: Secondary | ICD-10-CM | POA: Diagnosis not present

## 2022-11-04 ENCOUNTER — Other Ambulatory Visit: Payer: Self-pay

## 2022-11-04 DIAGNOSIS — R000 Tachycardia, unspecified: Secondary | ICD-10-CM | POA: Insufficient documentation

## 2022-11-04 DIAGNOSIS — Z3A17 17 weeks gestation of pregnancy: Secondary | ICD-10-CM | POA: Diagnosis not present

## 2022-11-04 DIAGNOSIS — O99282 Endocrine, nutritional and metabolic diseases complicating pregnancy, second trimester: Secondary | ICD-10-CM | POA: Diagnosis not present

## 2022-11-04 DIAGNOSIS — Z1152 Encounter for screening for COVID-19: Secondary | ICD-10-CM | POA: Diagnosis not present

## 2022-11-04 DIAGNOSIS — O211 Hyperemesis gravidarum with metabolic disturbance: Secondary | ICD-10-CM | POA: Insufficient documentation

## 2022-11-04 DIAGNOSIS — E860 Dehydration: Secondary | ICD-10-CM | POA: Diagnosis not present

## 2022-11-04 DIAGNOSIS — O219 Vomiting of pregnancy, unspecified: Secondary | ICD-10-CM | POA: Diagnosis present

## 2022-11-04 LAB — CBC WITH DIFFERENTIAL/PLATELET
Abs Immature Granulocytes: 0.14 10*3/uL — ABNORMAL HIGH (ref 0.00–0.07)
Basophils Absolute: 0 10*3/uL (ref 0.0–0.1)
Basophils Relative: 0 %
Eosinophils Absolute: 0.2 10*3/uL (ref 0.0–0.5)
Eosinophils Relative: 1 %
HCT: 35.7 % — ABNORMAL LOW (ref 36.0–46.0)
Hemoglobin: 11.9 g/dL — ABNORMAL LOW (ref 12.0–15.0)
Immature Granulocytes: 1 %
Lymphocytes Relative: 10 %
Lymphs Abs: 1.9 10*3/uL (ref 0.7–4.0)
MCH: 28.5 pg (ref 26.0–34.0)
MCHC: 33.3 g/dL (ref 30.0–36.0)
MCV: 85.6 fL (ref 80.0–100.0)
Monocytes Absolute: 1.4 10*3/uL — ABNORMAL HIGH (ref 0.1–1.0)
Monocytes Relative: 8 %
Neutro Abs: 14.8 10*3/uL — ABNORMAL HIGH (ref 1.7–7.7)
Neutrophils Relative %: 80 %
Platelets: 344 10*3/uL (ref 150–400)
RBC: 4.17 MIL/uL (ref 3.87–5.11)
RDW: 13.3 % (ref 11.5–15.5)
WBC: 18.4 10*3/uL — ABNORMAL HIGH (ref 4.0–10.5)
nRBC: 0 % (ref 0.0–0.2)

## 2022-11-04 LAB — COMPREHENSIVE METABOLIC PANEL
ALT: 19 U/L (ref 0–44)
AST: 24 U/L (ref 15–41)
Albumin: 3.4 g/dL — ABNORMAL LOW (ref 3.5–5.0)
Alkaline Phosphatase: 63 U/L (ref 38–126)
Anion gap: 9 (ref 5–15)
BUN: 9 mg/dL (ref 6–20)
CO2: 24 mmol/L (ref 22–32)
Calcium: 9 mg/dL (ref 8.9–10.3)
Chloride: 102 mmol/L (ref 98–111)
Creatinine, Ser: 0.73 mg/dL (ref 0.44–1.00)
GFR, Estimated: >60 mL/min (ref >60)
Glucose, Bld: 103 mg/dL — ABNORMAL HIGH (ref 70–99)
Potassium: 3.4 mmol/L — ABNORMAL LOW (ref 3.5–5.1)
Sodium: 135 mmol/L (ref 135–145)
Total Bilirubin: 0.6 mg/dL (ref 0.3–1.2)
Total Protein: 7.4 g/dL (ref 6.5–8.1)

## 2022-11-04 LAB — RESP PANEL BY RT-PCR (RSV, FLU A&B, COVID)  RVPGX2
Influenza A by PCR: NEGATIVE
Influenza B by PCR: NEGATIVE
Resp Syncytial Virus by PCR: NEGATIVE
SARS Coronavirus 2 by RT PCR: NEGATIVE

## 2022-11-04 MED ORDER — ONDANSETRON 4 MG PO TBDP
4.0000 mg | ORAL_TABLET | Freq: Once | ORAL | Status: AC
  Administered 2022-11-04: 4 mg via ORAL
  Filled 2022-11-04: qty 1

## 2022-11-04 NOTE — ED Notes (Signed)
FHT's 144

## 2022-11-04 NOTE — ED Triage Notes (Signed)
Pt presents to ER from home with c/o n/v/d that started yesterday.  Pt states she was recently dx with thrush, and has been taking PO nystatin since yesterday.  Pt states she is [redacted] weeks pregnant, and has had no issues with pregnancies.  Pt states she is unable to keep anything down at this point.  Pt is A&O x4 at this time in NAD in triage.

## 2022-11-04 NOTE — ED Provider Triage Note (Signed)
Emergency Medicine Provider Triage Evaluation Note  Natalie Welch , a 32 y.o. female  was evaluated in triage.  Pt complains of nausea, vomiting, diarrhea starting yesterday.  She is [redacted] weeks pregnant.  She had morning sickness during her first trimester but that had pretty much subsided until the symptoms started.  She is currently taking Diclegis and Zofran without any relief.  She is also using nystatin for thrush.  No fever.  No abdominal cramping, vaginal bleeding, or fluid leaking.  Physical Exam  BP (!) 132/94 (BP Location: Left Arm)   Pulse (!) 134   Temp 98.5 F (36.9 C) (Oral)   Resp (!) 24   Ht 5\' 1"  (1.549 m)   Wt 71.2 kg   LMP  (LMP Unknown) Comment: 17 weeks  SpO2 98%   BMI 29.66 kg/m  Gen:   Awake, no distress   Resp:  Normal effort  MSK:   Moves extremities without difficulty  Other:    Medical Decision Making  Medically screening exam initiated at 10:27 PM.  Appropriate orders placed.  Natalie Welch was informed that the remainder of the evaluation will be completed by another provider, this initial triage assessment does not replace that evaluation, and the importance of remaining in the ED until their evaluation is complete.  FHR 144.    Victorino Dike, FNP 11/04/22 2229

## 2022-11-05 ENCOUNTER — Emergency Department: Payer: No Typology Code available for payment source

## 2022-11-05 ENCOUNTER — Emergency Department
Admission: EM | Admit: 2022-11-05 | Discharge: 2022-11-05 | Disposition: A | Discharge status: Home / Self Care | Payer: No Typology Code available for payment source | Attending: Emergency Medicine | Admitting: Emergency Medicine

## 2022-11-05 DIAGNOSIS — R000 Tachycardia, unspecified: Secondary | ICD-10-CM

## 2022-11-05 DIAGNOSIS — O211 Hyperemesis gravidarum with metabolic disturbance: Secondary | ICD-10-CM

## 2022-11-05 LAB — URINALYSIS, ROUTINE W REFLEX MICROSCOPIC
Bacteria, UA: NONE SEEN
Bilirubin Urine: NEGATIVE
Glucose, UA: NEGATIVE mg/dL
Hgb urine dipstick: NEGATIVE
Ketones, ur: 20 mg/dL — AB
Leukocytes,Ua: NEGATIVE
Nitrite: NEGATIVE
Protein, ur: 30 mg/dL — AB
Specific Gravity, Urine: 1.026 (ref 1.005–1.030)
pH: 5 (ref 5.0–8.0)

## 2022-11-05 MED ORDER — LORAZEPAM 2 MG/ML IJ SOLN
1.0000 mg | Freq: Once | INTRAMUSCULAR | Status: AC
  Administered 2022-11-05: 1 mg via INTRAVENOUS
  Filled 2022-11-05: qty 1

## 2022-11-05 MED ORDER — DEXTROSE IN LACTATED RINGERS 5 % IV SOLN
1000.0000 mL | Freq: Once | INTRAVENOUS | Status: AC
  Administered 2022-11-05: 1000 mL via INTRAVENOUS
  Filled 2022-11-05: qty 1000

## 2022-11-05 MED ORDER — LORAZEPAM 1 MG PO TABS: 1.0000 mg | ORAL_TABLET | Freq: Once | ORAL | Status: DC

## 2022-11-05 MED ORDER — DEXTROSE IN LACTATED RINGERS 5 % IV SOLN
1000.0000 mL | Freq: Once | INTRAVENOUS | Status: AC
  Administered 2022-11-05: 1000 mL via INTRAVENOUS

## 2022-11-05 MED ORDER — METOCLOPRAMIDE HCL 5 MG/ML IJ SOLN
10.0000 mg | Freq: Once | INTRAMUSCULAR | Status: AC
  Administered 2022-11-05: 10 mg via INTRAVENOUS
  Filled 2022-11-05: qty 2

## 2022-11-05 MED ORDER — DEXTROSE IN LACTATED RINGERS 5 % IV SOLN
3000.0000 mL | Freq: Once | INTRAVENOUS | Status: AC
  Administered 2022-11-05: 3000 mL via INTRAVENOUS

## 2022-11-05 NOTE — ED Notes (Signed)
Pt provided with crackers and po fluids to po challenge

## 2022-11-05 NOTE — ED Notes (Signed)
Waiting on d5lr from central supply

## 2022-11-05 NOTE — ED Provider Notes (Signed)
[facility] Provider Note    Event Date/Time   First MD Initiated Contact with Patient 11/05/22 0034     (approximate)   History   Emesis During Pregnancy   HPI  Natalie Welch is a 32 y.o. female who presents to the ED for evaluation of Emesis During Pregnancy   I reviewed documentation from a routine prenatal exam from 12/27.  G3 P1-0-1-1, currently at about [redacted] weeks gestation. Also review a cardiology consult from 12/12.  Evaluated for palpitations and tachycardia.  Scheduled a Holter monitor, echo and follow-up for later this week.  I reviewed the echo with a normal EF, but do not see any documentation from a Holter monitor result.  Patient presents to the ED for evaluation of "I cannot keep anything down."  She is also reporting some right lower quadrant abdominal cramping pain that she thought was "just a charley horse."  No vaginal bleeding or novel discharge.  No dysuria.  No fevers  She is SP cholecystectomy and cesarean section x 1, but still has appendix in place  Physical Exam   Triage Vital Signs: ED Triage Vitals  Enc Vitals Group     BP 11/04/22 2152 (!) 132/94     Pulse Rate 11/04/22 2152 (!) 134     Resp 11/04/22 2152 (!) 24     Temp 11/04/22 2152 98.5 F (36.9 C)     Temp Source 11/04/22 2152 Oral     SpO2 11/04/22 2152 98 %     Weight 11/04/22 2153 157 lb (71.2 kg)     Height 11/04/22 2153 5\' 1"  (1.549 m)     Head Circumference --      Peak Flow --      Pain Score 11/04/22 2159 0     Pain Loc --      Pain Edu? --      Excl. in [place]? --     Most recent vital signs: Vitals:   11/05/22 0500 11/05/22 0600  BP: (!) 107/53 110/62  Pulse: (!) 124 (!) 128  Resp:    Temp:    SpO2: 97% 100%    General: Awake, no distress.  Seems uncomfortable, difficulty getting still CV:  Good peripheral perfusion.  Resp:  Normal effort.  Abd:  No distention.  Minimal gravid.  RLQ tenderness around the Burney's point is present.  No  peritoneal features.  Benign upper abdomen. MSK:  No deformity noted.  Neuro:  No focal deficits appreciated. Other:     ED Results / Procedures / Treatments   Labs (all labs ordered are listed, but only abnormal results are displayed) Labs Reviewed  CBC WITH DIFFERENTIAL/PLATELET - Abnormal; Notable for the following components:      Result Value   WBC 18.4 (*)    Hemoglobin 11.9 (*)    HCT 35.7 (*)    Neutro Abs 14.8 (*)    Monocytes Absolute 1.4 (*)    Abs Immature Granulocytes 0.14 (*)    All other components within normal limits  COMPREHENSIVE METABOLIC PANEL - Abnormal; Notable for the following components:   Potassium 3.4 (*)    Glucose, Bld 103 (*)    Albumin 3.4 (*)    All other components within normal limits  URINALYSIS, ROUTINE W REFLEX MICROSCOPIC - Abnormal; Notable for the following components:   Color, Urine YELLOW (*)    APPearance HAZY (*)    Ketones, ur 20 (*)    Protein, ur 30 (*)  All other components within normal limits  RESP PANEL BY RT-PCR (RSV, FLU A&B, COVID)  RVPGX2    EKG   RADIOLOGY   Official radiology report(s): MR ABDOMEN WO CONTRAST  Result Date: 11/05/2022 CLINICAL DATA:  32 year old pregnant female with history of right lower quadrant abdominal pain and leukocytosis. Nausea, vomiting and diarrhea since yesterday. EXAM: MRI ABDOMEN AND PELVIS WITHOUT CONTRAST TECHNIQUE: Multiplanar multisequence MR imaging of the abdomen and pelvis was performed. No intravenous contrast was administered. COMPARISON:  Abdominal MRI 06/16/2021. CT of the abdomen and pelvis 06/15/2021. FINDINGS: COMBINED FINDINGS FOR BOTH MR ABDOMEN AND PELVIS Lower chest: Unremarkable. Hepatobiliary: No suspicious cystic or solid hepatic lesions are confidently identified on today's noncontrast examination. Status post cholecystectomy. No intra or extrahepatic biliary ductal dilatation. Common bile duct measures 4 mm in the porta hepatis. No definite filling defect within  the common bile duct to suggest choledocholithiasis. Pancreas: No definite pancreatic mass or peripancreatic fluid collections or inflammatory changes are noted on today's noncontrast examination. Spleen:  Unremarkable. Adrenals/Urinary Tract: Bilateral kidneys and adrenal glands are normal in appearance. No hydroureteronephrosis. Urinary bladder is unremarkable in appearance. Stomach/Bowel: The appearance of the stomach, small bowel and colon are unremarkable. The appendix is visualized and is normal in appearance without periappendiceal fluid or inflammatory changes. Vascular/Lymphatic: No aneurysm identified in the abdominal or pelvic vasculature. No lymphadenopathy noted in the abdomen or pelvis. Reproductive: Gravid uterus with single IUP. Placenta is located anteriorly slightly to the left of midline. Ovaries are grossly unremarkable in appearance. Other:  No significant volume of free fluid. Musculoskeletal: No aggressive appearing osseous lesions are noted in the visualized portions of the skeleton. IMPRESSION: 1. No acute findings are noted in the abdomen or pelvis to account for the patient's symptoms. Specifically, the appendix is normal. 2. Gravid uterus with single IUP. 3. Status post cholecystectomy. Electronically Signed   By: Vinnie Langton M.D.   On: 11/05/2022 05:15   MR PELVIS WO CONTRAST  Result Date: 11/05/2022 CLINICAL DATA:  32 year old pregnant female with history of right lower quadrant abdominal pain and leukocytosis. Nausea, vomiting and diarrhea since yesterday. EXAM: MRI ABDOMEN AND PELVIS WITHOUT CONTRAST TECHNIQUE: Multiplanar multisequence MR imaging of the abdomen and pelvis was performed. No intravenous contrast was administered. COMPARISON:  Abdominal MRI 06/16/2021. CT of the abdomen and pelvis 06/15/2021. FINDINGS: COMBINED FINDINGS FOR BOTH MR ABDOMEN AND PELVIS Lower chest: Unremarkable. Hepatobiliary: No suspicious cystic or solid hepatic lesions are confidently  identified on today's noncontrast examination. Status post cholecystectomy. No intra or extrahepatic biliary ductal dilatation. Common bile duct measures 4 mm in the porta hepatis. No definite filling defect within the common bile duct to suggest choledocholithiasis. Pancreas: No definite pancreatic mass or peripancreatic fluid collections or inflammatory changes are noted on today's noncontrast examination. Spleen:  Unremarkable. Adrenals/Urinary Tract: Bilateral kidneys and adrenal glands are normal in appearance. No hydroureteronephrosis. Urinary bladder is unremarkable in appearance. Stomach/Bowel: The appearance of the stomach, small bowel and colon are unremarkable. The appendix is visualized and is normal in appearance without periappendiceal fluid or inflammatory changes. Vascular/Lymphatic: No aneurysm identified in the abdominal or pelvic vasculature. No lymphadenopathy noted in the abdomen or pelvis. Reproductive: Gravid uterus with single IUP. Placenta is located anteriorly slightly to the left of midline. Ovaries are grossly unremarkable in appearance. Other:  No significant volume of free fluid. Musculoskeletal: No aggressive appearing osseous lesions are noted in the visualized portions of the skeleton. IMPRESSION: 1. No acute findings are noted in the abdomen  or pelvis to account for the patient's symptoms. Specifically, the appendix is normal. 2. Gravid uterus with single IUP. 3. Status post cholecystectomy. Electronically Signed   By: Trudie Reed M.D.   On: 11/05/2022 05:15    PROCEDURES and INTERVENTIONS:  Procedures  Medications  ondansetron (ZOFRAN-ODT) disintegrating tablet 4 mg (4 mg Oral Given 11/04/22 2212)  dextrose 5 % in lactated ringers infusion (0 mLs Intravenous Stopped 11/05/22 0257)  dextrose 5 % in lactated ringers infusion (0 mLs Intravenous Stopped 11/05/22 0257)  metoCLOPramide (REGLAN) injection 10 mg (10 mg Intravenous Given 11/05/22 0326)  LORazepam (ATIVAN)  injection 1 mg (1 mg Intravenous Given 11/05/22 0257)  dextrose 5 % in lactated ringers infusion (0 mLs Intravenous Stopped 11/05/22 0530)  dextrose 5 % in lactated ringers infusion (1,000 mLs Intravenous New Bag/Given 11/05/22 0531)     IMPRESSION / MDM / ASSESSMENT AND PLAN / ED COURSE  I reviewed the triage vital signs and the nursing notes.  Differential diagnosis includes, but is not limited to, hyperemesis gravidarum, dehydration, acute cystitis, ureteral stone, appendicitis, diverticulitis  {Patient presents with symptoms of an acute illness or injury that is potentially life-threatening.  Pregnant woman presents with hyperemesis, dehydration.  She is tachycardic, but noted to always be tachycardic and is being evaluated by cardiology as an outpatient for this.  Blood work is noted to have leukocytosis of uncertain etiology, could be infectious in the setting of acute appendicitis versus hemoconcentration and stress reaction from her recurrent emesis.  Metabolic panel is essentially normal.  Urine with ketones suggestive of dehydration, but no bacteriuria or signs of cystitis.  Due to her RLQ tenderness and pain alongside this leukocytosis, we will MRI to assess for appendicitis as we provide fluid resuscitation and antiemetics.  MRI without evidence of appendicitis or intra-abdominal pathology.  Feeling better after fluid resuscitation and antiemetics.  We will p.o. challenge and I suspect discharge.  Chronic tachycardia is noted but she has no cardiopulmonary symptoms and I doubt PE or other cardiopulmonary acute pathology.  Will defer to outpatient cardiology at this point considering her lack of associated symptoms.  Clinical Course as of 11/05/22 0629  Wed Nov 05, 2022  0531 Reassessed.  Feeling better.  We discussed reassuring MRI [DS]    Clinical Course User Index [DS] Delton Prairie, MD     FINAL CLINICAL IMPRESSION(S) / ED DIAGNOSES   Final diagnoses:  Hyperemesis gravidarum  with dehydration  Chronic tachycardia     Rx / DC Orders   ED Discharge Orders     None        Note:  This document was prepared using Dragon voice recognition software and may include unintentional dictation errors.   Delton Prairie, MD 11/05/22 [phone]

## 2022-11-05 NOTE — ED Notes (Signed)
Report to zach, rn.

## 2022-11-05 NOTE — ED Notes (Signed)
Pt with constant retching, md notified via secure message chat.

## 2022-12-01 ENCOUNTER — Encounter: Payer: Self-pay | Admitting: *Deleted

## 2022-12-01 ENCOUNTER — Encounter: Payer: No Typology Code available for payment source | Attending: Certified Nurse Midwife | Admitting: *Deleted

## 2022-12-01 VITALS — BP 120/70 | Ht 61.0 in | Wt 165.1 lb

## 2022-12-01 DIAGNOSIS — Z3A00 Weeks of gestation of pregnancy not specified: Secondary | ICD-10-CM | POA: Diagnosis not present

## 2022-12-01 DIAGNOSIS — Z713 Dietary counseling and surveillance: Secondary | ICD-10-CM | POA: Diagnosis not present

## 2022-12-01 DIAGNOSIS — O24410 Gestational diabetes mellitus in pregnancy, diet controlled: Secondary | ICD-10-CM | POA: Insufficient documentation

## 2022-12-01 NOTE — Progress Notes (Signed)
Diabetes Self-Management Education  Visit Type: First/Initial  Appt. Start Time: 1325 Appt. End Time: 8841  12/01/2022  [name], identified by name and date of birth, is a 32 y.o. female with a diagnosis of Diabetes: Gestational Diabetes.   ASSESSMENT  Blood pressure 120/70, height 5\' 1"  (1.549 m), weight 165 lb 1.6 oz (74.9 kg). Last menstrual period 07/05/2022; estimated date of delivery 04/11/2023 Body mass index is 31.2 kg/m.   Diabetes Self-Management Education - 12/01/22 1508       Visit Information   Visit Type First/Initial      Initial Visit   Diabetes Type Gestational Diabetes    Date Diagnosed January 2024    Are you currently following a meal plan? No    Are you taking your medications as prescribed? Yes      Health Coping   How would you rate your overall health? Fair      Psychosocial Assessment   Patient Belief/Attitude about Diabetes Other (comment)   "bummed"   What is the hardest part about your diabetes right now, causing you the most concern, or is the most worrisome to you about your diabetes?   Making healty food and beverage choices;Checking blood sugar    Self-care barriers None    Self-management support Doctor's office;Family    Patient Concerns Nutrition/Meal planning;Monitoring    Special Needs None    Preferred Learning Style Hands on    Learning Readiness Ready    How often do you need to have someone help you when you read instructions, pamphlets, or other written materials from your doctor or pharmacy? 1 - Never    What is the last grade level you completed in school? high school      Pre-Education Assessment   Patient understands the diabetes disease and treatment process. Needs Instruction    Patient understands incorporating nutritional management into lifestyle. Needs Instruction    Patient undertands incorporating physical activity into lifestyle. Needs Instruction    Patient understands using medications safely. Needs  Instruction    Patient understands monitoring blood glucose, interpreting and using results Needs Instruction    Patient understands prevention, detection, and treatment of acute complications. Needs Instruction    Patient understands prevention, detection, and treatment of chronic complications. Needs Instruction    Patient understands how to develop strategies to address psychosocial issues. Needs Instruction    Patient understands how to develop strategies to promote health/change behavior. Needs Instruction      Complications   Last HgB A1C per patient/outside source 5.5 %   09/24/2022   How often do you check your blood sugar? 0 times/day (not testing)   Pt brought her Accu-Chek Guide meter and FreeStyle Libre 2. Instructed on both. BG was 114 mg/dL at 2:15 pm - 1 1/2 hrs pp. I had to prick her finger and apply blood as she didn't want to look at the blood. Libre inserted to back of left arm.   Have you had a dilated eye exam in the past 12 months? Yes    Have you had a dental exam in the past 12 months? No    Are you checking your feet? No      Dietary Intake   Breakfast eggs and sausage; buttered toast; poptart; cereal and milk; [place]    Snack (morning) triskets and cheese; goldfish; yogurt; graham crackers, 1/2 banana    Lunch sometimes skips or eats crackers and cheese, rice cakes, hot pocket    Snack (afternoon) same  as morning    Dinner chicken, beef; potatoes, beans, corn, peas, green beans, rice, pasta, broccoli, squahs, zucchini; salads with lettuce, eggs, cheese, cuccumbers and ranch dressing    Beverage(s) water, juice, soda, coffee with sugar, unsweetened drinks, Gatorade zero      Activity / Exercise   Activity / Exercise Type Light (walking / raking leaves)    How many days per week do you exercise? 3.5    How many minutes per day do you exercise? 20    Total minutes per week of exercise 70      Patient Education   Previous Diabetes Education No    Disease  Pathophysiology Definition of diabetes, type 1 and 2, and the diagnosis of diabetes;Factors that contribute to the development of diabetes    Healthy Eating Role of diet in the treatment of diabetes and the relationship between the three main macronutrients and blood glucose level;Food label reading, portion sizes and measuring food.;Reviewed blood glucose goals for pre and post meals and how to evaluate the patients' food intake on their blood glucose level.    Being Active Role of exercise on diabetes management, blood pressure control and cardiac health.    Medications Other (comment)   Limited use of oral medications during pregnancy and potential for insulin.   Monitoring Taught/evaluated SMBG meter.;Taught/evaluated CGM (comment);Purpose and frequency of SMBG.;Taught/discussed recording of test results and interpretation of SMBG.;Identified appropriate SMBG and/or A1C goals.;Ketone testing, when, how.    Chronic complications Relationship between chronic complications and blood glucose control    Diabetes Stress and Support Identified and addressed patients feelings and concerns about diabetes;Role of stress on diabetes    Preconception care Pregnancy and GDM  Role of pre-pregnancy blood glucose control on the development of the fetus;Reviewed with patient blood glucose goals with pregnancy;Role of family planning for patients with diabetes      Individualized Goals (developed by patient)   Reducing Risk Other (comment)   prevent diabetes complications, become more fit     Outcomes   Expected Outcomes Demonstrated interest in learning. Expect positive outcomes    Future DMSE 2 wks         Individualized Plan for Diabetes Self-Management Training:   Learning Objective:  Patient will have a greater understanding of diabetes self-management. Patient education plan is to attend individual and/or group sessions per assessed needs and concerns.   Plan:   Patient Instructions  Read booklet  on Gestational Diabetes Follow Gestational Meal Planning Guidelines Don't skip meals - eat 1 protein and 1 carbohydrate serving Avoid fruit juice and sugar sweetened drinks (coffee, soda) Increase water to 5-6 servings/day Limit fried foods Complete a 3 Day Food Record and bring to next appointment Check blood sugars 4 x day - before breakfast and 2 hrs after every meal and record  Bring blood sugar log to all appointments Purchase urine ketone strips if instructed by MD and check urine ketones every am:  If + increase bedtime snack to 1 protein and 2 carbohydrate servings Walk 20-30 minutes at least 5 x week if permitted by MD  Expected Outcomes:  Demonstrated interest in learning. Expect positive outcomes  Education material provided:  Gestational Booklet Gestational Meal Planning Guidelines Simple Meal Plan 3 Day Food Record Goals for a Healthy Pregnancy  If problems or questions, patient to contact team via:   Johny Drilling, RN, [place] [phone]  Future DSME appointment: 2 wks December 17, 2022 with the dietitian

## 2022-12-01 NOTE — Patient Instructions (Signed)
Read booklet on Gestational Diabetes Follow Gestational Meal Planning Guidelines Don't skip meals - eat 1 protein and 1 carbohydrate serving Avoid fruit juice and sugar sweetened drinks (coffee, soda) Increase water to 5-6 servings/day Limit fried foods Complete a 3 Day Food Record and bring to next appointment Check blood sugars 4 x day - before breakfast and 2 hrs after every meal and record  Bring blood sugar log to all appointments Purchase urine ketone strips if instructed by MD and check urine ketones every am:  If + increase bedtime snack to 1 protein and 2 carbohydrate servings Walk 20-30 minutes at least 5 x week if permitted by MD

## 2022-12-17 ENCOUNTER — Encounter: Payer: No Typology Code available for payment source | Admitting: Dietician

## 2022-12-17 ENCOUNTER — Encounter: Payer: Self-pay | Admitting: Dietician

## 2022-12-17 VITALS — BP 110/76 | Wt 167.0 lb

## 2022-12-17 DIAGNOSIS — O24410 Gestational diabetes mellitus in pregnancy, diet controlled: Secondary | ICD-10-CM | POA: Diagnosis not present

## 2022-12-17 NOTE — Progress Notes (Signed)
Patient's BG record indicates fasting BGs ranging 73-94, and post-meal BGs ranging 70-128 with 3 of 45 readings >120.  Patient reports one episode of hypoglycemia symptoms after an episode of vomiting. She contacted medical office for advice and she followed through with drinking juice which did improve her BG. Reviewed proper treatment for hypoglycemia. Discussed avoidance of high fat, large meals to help control nausea, as well as importance of eating at regular intervals of 3-4 hours, no longer than 5 hours without eating during the day. Patient's food diary indicates she is eating at regular intervals, including protein source with all meals and most snacks, and is generally controlling portions to avoid high carb meals.   Provided basic balanced meal plan, and wrote individualized menus based on patient's food preferences. Instructed patient on food safety, including avoidance of Listeriosis, and limiting mercury from fish. Discussed importance of maintaining healthy lifestyle habits to reduce risk of Type 2 DM as well as Gestational DM with any future pregnancies. Advised patient to use any remaining testing supplies to test some BGs after delivery, and to have BG tested ideally annually, as well as prior to attempting future pregnancies (she is planning tubal ligation).

## 2022-12-17 NOTE — Patient Instructions (Addendum)
If blood sugar is below 70, drink 4oz juice or regular soda or 8oz almond milk, then retest after 15 minutes. If improved, follow with a snack that has protein and starch or fruit within 30 minutes.  Eat a meal or snack every 3-5 hours during the day, low fat choices to help prevent nausea. Avoiding long stretches of an empty stomach. Allow flexibility with portions of protein foods and low carb veggies according to hunger while keeping carbs ro 45g or less with meals. Great job including daily exercise, keep it up!

## 2023-02-04 DIAGNOSIS — O24410 Gestational diabetes mellitus in pregnancy, diet controlled: Secondary | ICD-10-CM | POA: Diagnosis present

## 2023-02-08 IMAGING — RF DG ERCP WO/W SPHINCTEROTOMY
1 series · 8 of 8 positions shown · non-contrast
Comparison: None.

CLINICAL DATA: 30-year-old female with a history of
choledocholithiasis

EXAM:
ERCP
TECHNIQUE: Multiple spot images obtained with the fluoroscopic device and
submitted for interpretation post-procedure.
FLUOROSCOPY TIME:  Fluoroscopy Time:  2 minutes 29 seconds

[Series 1: unknown protocol · 0.20mm/px · 8 of 8 slices shown]
[im 1/8]
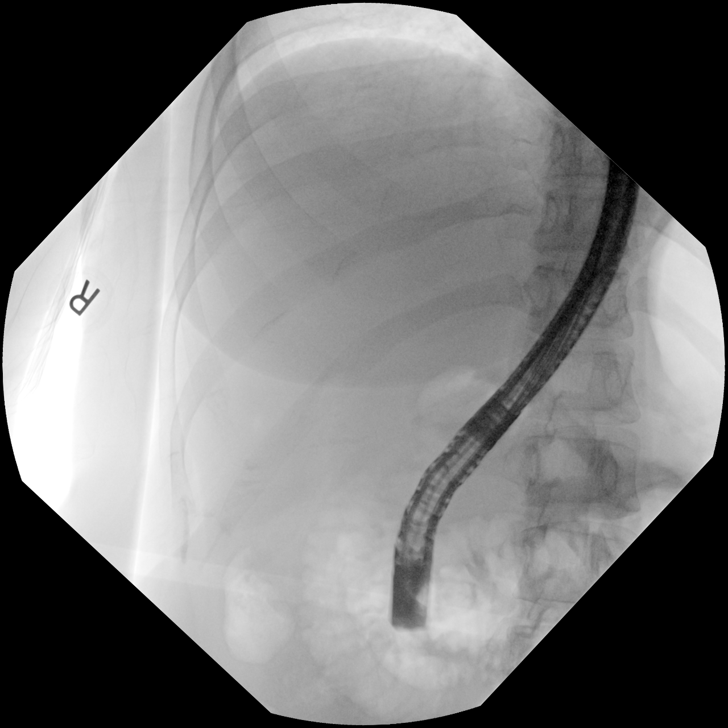
[im 2/8]
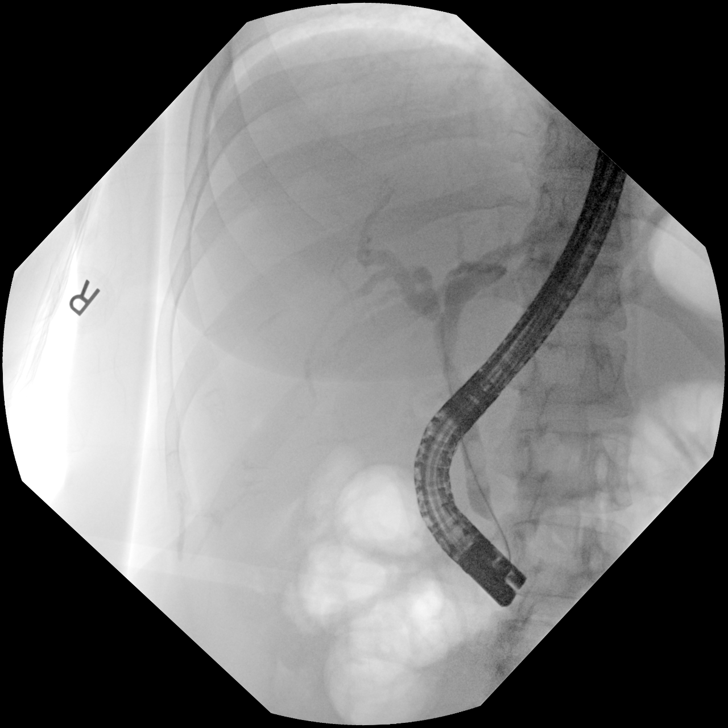
[im 3/8]
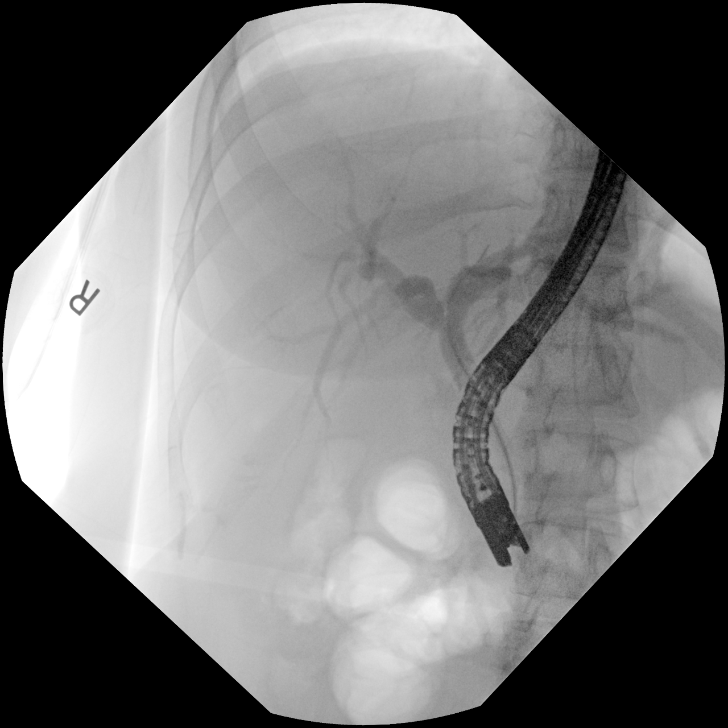
[im 4/8]
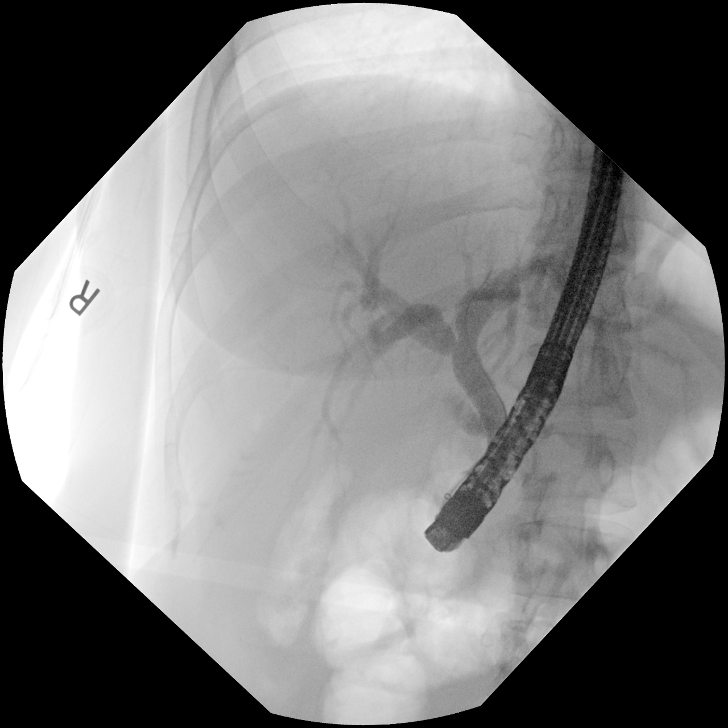
[im 5/8]
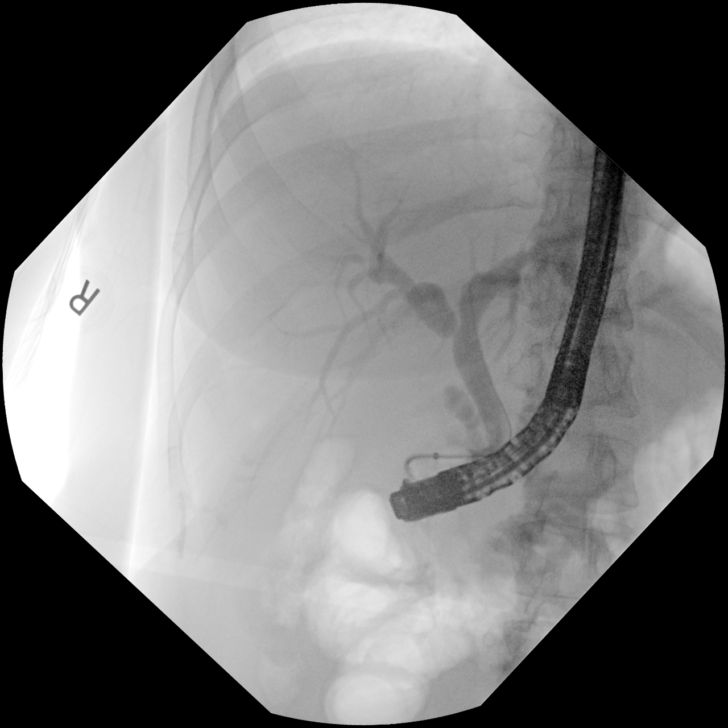
[im 6/8]
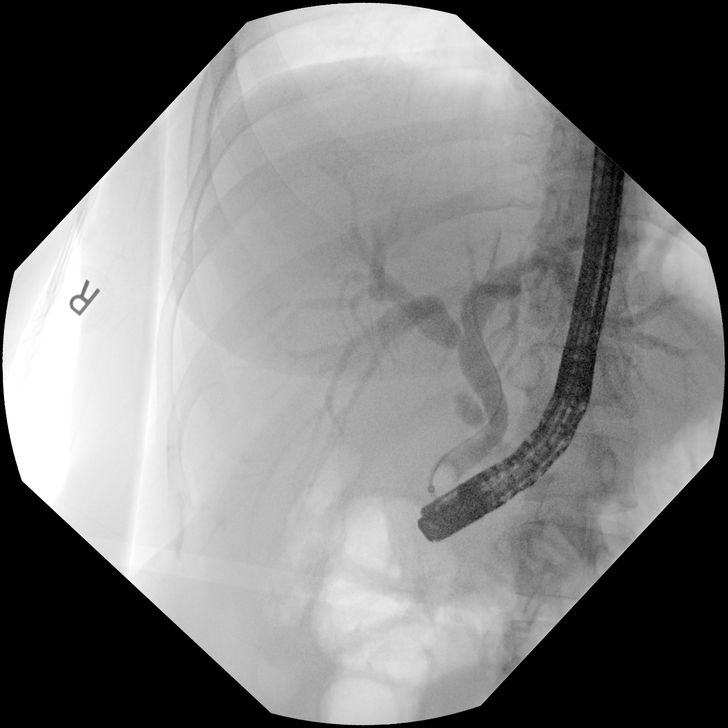
[im 7/8]
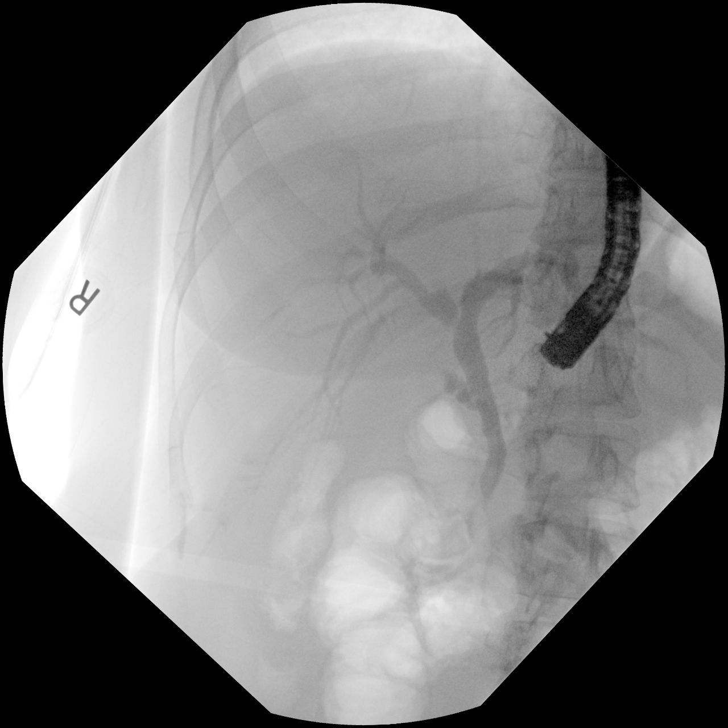
[im 8/8]
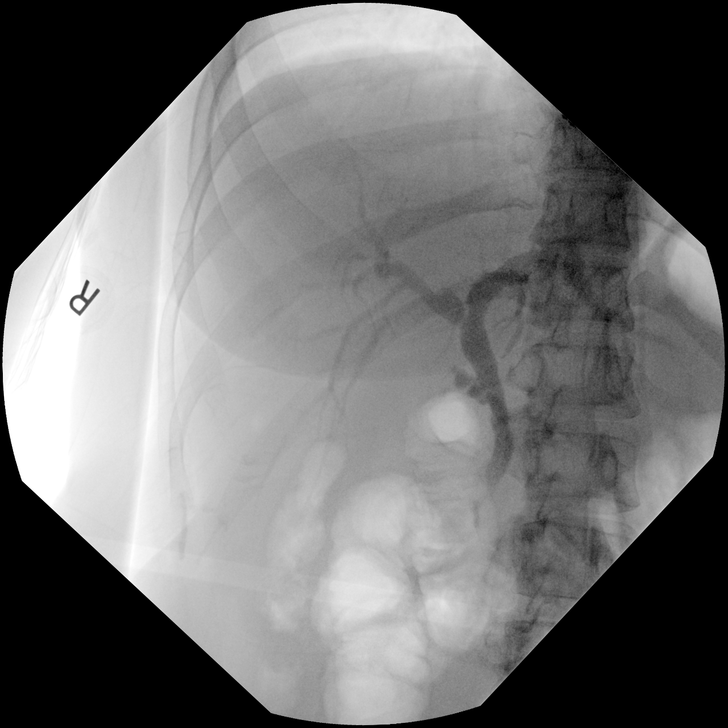

[8 of 8 positions shown; findings below may reference images not displayed]

FINDINGS: Limited intraoperative fluoroscopic spot images during ERCP.

Initial image demonstrates endoscope projecting over the upper
abdomen. There is then cannulation of the ampulla and retrograde
infusion of contrast partially opacifying the extrahepatic biliary
ducts. Rounded filling defects within the common bile duct may be
air bubbles or debris/stones.

Subsequently there is deployment of a retrieval balloon.
IMPRESSION: Limited images during ERCP demonstrates treatment of
choledocholithiasis by deployment of a retrieval balloon. Please
refer to the dictated operative report for full details of
intraoperative findings and procedure.

## 2023-03-08 ENCOUNTER — Observation Stay: Payer: 59

## 2023-03-08 ENCOUNTER — Observation Stay
Admission: EM | Admit: 2023-03-08 | Discharge: 2023-03-09 | Disposition: A | Discharge status: Home / Self Care | Payer: 59 | Attending: Obstetrics and Gynecology | Admitting: Obstetrics and Gynecology

## 2023-03-08 DIAGNOSIS — Z3A35 35 weeks gestation of pregnancy: Secondary | ICD-10-CM | POA: Diagnosis not present

## 2023-03-08 DIAGNOSIS — O24410 Gestational diabetes mellitus in pregnancy, diet controlled: Secondary | ICD-10-CM | POA: Insufficient documentation

## 2023-03-08 DIAGNOSIS — W010XXA Fall on same level from slipping, tripping and stumbling without subsequent striking against object, initial encounter: Secondary | ICD-10-CM | POA: Insufficient documentation

## 2023-03-08 DIAGNOSIS — Z833 Family history of diabetes mellitus: Secondary | ICD-10-CM | POA: Insufficient documentation

## 2023-03-08 DIAGNOSIS — Z3A36 36 weeks gestation of pregnancy: Secondary | ICD-10-CM | POA: Insufficient documentation

## 2023-03-08 DIAGNOSIS — O133 Gestational [pregnancy-induced] hypertension without significant proteinuria, third trimester: Secondary | ICD-10-CM | POA: Diagnosis not present

## 2023-03-08 DIAGNOSIS — O9A213 Injury, poisoning and certain other consequences of external causes complicating pregnancy, third trimester: Principal | ICD-10-CM | POA: Insufficient documentation

## 2023-03-08 DIAGNOSIS — T1490XA Injury, unspecified, initial encounter: Secondary | ICD-10-CM | POA: Insufficient documentation

## 2023-03-08 DIAGNOSIS — O9A219 Injury, poisoning and certain other consequences of external causes complicating pregnancy, unspecified trimester: Secondary | ICD-10-CM | POA: Diagnosis present

## 2023-03-08 MED ORDER — LACTATED RINGERS IV SOLN: 125.0000 mL/h | INTRAVENOUS | Status: DC

## 2023-03-08 MED ORDER — PRENATAL MULTIVITAMIN CH: 1.0000 | ORAL_TABLET | Freq: Every day | ORAL | Status: DC

## 2023-03-08 MED ORDER — DOCUSATE SODIUM 100 MG PO CAPS: 100.0000 mg | ORAL_CAPSULE | Freq: Every day | ORAL | Status: DC

## 2023-03-08 MED ORDER — CALCIUM CARBONATE ANTACID 500 MG PO CHEW: 2.0000 | CHEWABLE_TABLET | ORAL | Status: DC | PRN

## 2023-03-08 MED ORDER — ACETAMINOPHEN 325 MG PO TABS: 650.0000 mg | ORAL_TABLET | ORAL | Status: DC | PRN

## 2023-03-08 MED ORDER — ZOLPIDEM TARTRATE 5 MG PO TABS: 5.0000 mg | ORAL_TABLET | Freq: Every evening | ORAL | Status: DC | PRN

## 2023-03-09 DIAGNOSIS — O9A213 Injury, poisoning and certain other consequences of external causes complicating pregnancy, third trimester: Secondary | ICD-10-CM | POA: Diagnosis not present

## 2023-03-09 MED ORDER — ONDANSETRON 4 MG PO TBDP
4.0000 mg | ORAL_TABLET | Freq: Once | ORAL | Status: AC
  Administered 2023-03-09: 4 mg via ORAL

## 2023-03-09 MED ORDER — ONDANSETRON 4 MG PO TBDP
ORAL_TABLET | ORAL | Status: AC
  Filled 2023-03-09: qty 1

## 2023-03-09 NOTE — Discharge Summary (Addendum)
Natalie Welch is a 32 y.o. female. She is at [redacted]w[redacted]d gestation. No LMP recorded (lmp unknown). Patient is pregnant. Estimated Date of Delivery: 04/11/23  Prenatal care site: [facility]   Current pregnancy complicated by:  - previous cesarean section - GDM, diet controlled - anxiety and depression - history of gHTN  Chief complaint: Fall  She fell around 8pm when she opened a truck door and slid on gravel, landing on her right side. She denies landing on her abdomen. She reports good fetal movement, occasional cramping. Denies any bleeding, leaking fluid, or pain.  S: Resting comfortably. no CTX, no VB.no LOF,  Active fetal movement.  Denies: HA, visual changes, SOB, or RUQ/epigastric pain  Maternal Medical History:   Past Medical History:  Diagnosis Date   Allergy    Anemia    Anxiety    no meds   Complication of anesthesia    Depression    Gestational diabetes    PONV (postoperative nausea and vomiting)     Past Surgical History:  Procedure Laterality Date   CESAREAN SECTION N/A 02/07/2020   Procedure: CESAREAN SECTION;  Surgeon: Conard Novak, MD;  Location: [facility] ORS;  Service: Obstetrics;  Laterality: N/A;   DILATION AND EVACUATION N/A 12/30/2018   Procedure: DILATATION AND EVACUATION;  Surgeon: Vena Austria, MD;  Location: [facility] ORS;  Service: Gynecology;  Laterality: N/A;   ENDOSCOPIC RETROGRADE CHOLANGIOPANCREATOGRAPHY (ERCP) WITH PROPOFOL N/A 06/17/2021   Procedure: ENDOSCOPIC RETROGRADE CHOLANGIOPANCREATOGRAPHY (ERCP) WITH PROPOFOL;  Surgeon: Iva Boop, MD;  Location[facility];  Service: Endoscopy;  Laterality: N/A;   REMOVAL OF STONES  06/17/2021   Procedure: REMOVAL OF STONES;  Surgeon: Iva Boop, MD;  Location: [facility] ENDOSCOPY;  Service: Endoscopy;;   SPHINCTEROTOMY  06/17/2021   Procedure: Dennison Mascot;  Surgeon: Iva Boop, MD;  Location: [facility] ENDOSCOPY;  Service: Endoscopy;;   WISDOM TOOTH EXTRACTION      Allergies   Allergen Reactions   Abilify [Aripiprazole] Other (See Comments)    Oculogyric crisis --eyes rolled in back of head   Ibuprofen Nausea And Vomiting    Only with higher strengths -- i.e., 600mg    Nuvigil [Armodafinil] Swelling    Tongue and throat swelling    Prior to Admission medications   Medication Sig Start Date End Date Taking? Authorizing Provider  aspirin EC 81 MG tablet Take 81 mg by mouth daily.    [provider]  Blood Glucose Monitoring Suppl (ACCU-CHEK GUIDE) w/Device KIT See admin instructions. 11/19/22   [provider]  Continuous Blood Gluc Receiver (DEXCOM G6 RECEIVER) DEVI Use 1 Device 4 (four) times daily Freestyle Libre 2 11/26/22   [provider]  Doxylamine-Pyridoxine 10-10 MG TBEC PLEASE SEE ATTACHED FOR DETAILED DIRECTIONS    [provider]  ondansetron (ZOFRAN-ODT) 4 MG disintegrating tablet Take 4 mg by mouth every 8 (eight) hours as needed.    [provider]  Prenatal Vit-Fe Fumarate-FA (PRENATAL MULTIVITAMIN) TABS tablet Take 1 tablet by mouth daily at 12 noon.    [provider]    Social History: She  reports that she has never smoked. She has never used smokeless tobacco. She reports that she does not currently use alcohol after a past usage of about 7.0 standard drinks of alcohol per week. She reports that she does not use drugs.  Family History: family history includes Diabetes in her paternal grandmother; Healthy in her father; Hypertension in her mother.  no history of gyn cancers  Review of Systems: A full review of systems was performed and negative except as noted in the HPI.     O:  LMP  (LMP Unknown) Comment: 17 weeks No results found for this or any previous visit (from the past 48 hour(s)).   Constitutional: NAD, AAOx3  HE/ENT: extraocular movements grossly intact, moist mucous membranes CV: RRR PULM: nl respiratory effort, CTABL     Abd: gravid, non-tender, non-distended,  soft      Ext: Non-tender, Nonedematous   Psych: mood appropriate, speech normal Pelvic: deferred   Fetal  monitoring: Cat 1 Appropriate for GA Baseline: 125bpm Variability: moderate Accelerations: present x >2 Decelerations absent Contractions: initially regular, then became rare after hydration Monitoring: until 4hrs after the fall  A/P: 32 y.o. [redacted]w[redacted]d here for antenatal surveillance for fall in pregnancy  Principle Diagnosis:  fall in pregnancy, High risk pregnancy in third trimester  Placenta abruption: not present. She was monitored until 4 hours after the fall. Normal fetal NST, rare uterine contractions, reassuring ultrasound. Instructed to continue monitoring for any symptoms or concerns and return with any concerns. Strict fetal kick counts. Labor: not present.  Fetal Wellbeing: Reassuring Cat 1 tracing. Reactive NST  D/c home stable, precautions reviewed, follow-up as scheduled.    Janyce Llanos, CNM 03/09/2023 12:09 AM

## 2023-03-09 NOTE — OB Triage Note (Signed)
Pt arrived to unit with complaints of a fall. Pt is [redacted]w[redacted]d G3P1. Pt states she fell around 8pm tonight on her side as she slid onto gravel. Pt has no visible marks on abdomen or trauma from fall. Patient placed on EFM and Toco to non tender area of abdomen. Pt reports active fetal movement and no symptoms consistent with ROM or active vaginal bleeding. Pt reports no pain. Will notify provider on call of patient's arrival. Pt history reviewed and consents for treatment signed.
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
## 2023-03-30 ENCOUNTER — Other Ambulatory Visit: Payer: Self-pay

## 2023-03-30 ENCOUNTER — Encounter
Admission: RE | Admit: 2023-03-30 | Discharge: 2023-03-30 | Disposition: A | Discharge status: Home / Self Care | Payer: 59 | Source: Ambulatory Visit | Attending: Obstetrics and Gynecology | Admitting: Obstetrics and Gynecology

## 2023-03-30 NOTE — Patient Instructions (Addendum)
Your procedure is scheduled on: Monday 04/06/23 Report to the Registration Desk on the 1st floor of the [facility] at 10:00 am.. [place] parking is available.  If your arrival time is 6:00 am, do not arrive before that time as the [facility] entrance doors do not open until 6:00 am.  REMEMBER: Instructions that are not followed completely may result in serious medical risk, up to and including death; or upon the discretion of your surgeon and anesthesiologist your surgery may need to be rescheduled.  Do not eat food or drink any liquids after midnight the night before surgery.  No gum chewing or hard candies.  One week prior to surgery: Stop Anti-inflammatories (NSAIDS) such as Advil, Aleve, Ibuprofen, Motrin, Naproxen, Naprosyn and Aspirin based products such as Excedrin, Goody's Powder, BC Powder. You may however, continue to take Tylenol if needed for pain up until the day of surgery.  Stop ANY OVER THE COUNTER supplements until after surgery.  Continue taking all prescribed medications.   TAKE ONLY THESE MEDICATIONS THE MORNING OF SURGERY WITH A SIP OF WATER:  famotidine (PEPCID) 20 MG tablet Antacid (take one the night before and one on the morning of surgery - helps to prevent nausea after surgery.)  No Alcohol for 24 hours before or after surgery.  No Smoking including e-cigarettes for 24 hours before surgery.  No chewable tobacco products for at least 6 hours before surgery.  No nicotine patches on the day of surgery.  Do not use any "recreational" drugs for at least a week (preferably 2 weeks) before your surgery.  Please be advised that the combination of cocaine and anesthesia may have negative outcomes, up to and including death. If you test positive for cocaine, your surgery will be cancelled.  On the morning of surgery brush your teeth with toothpaste and water, you may rinse your mouth with mouthwash if you wish. Do not swallow any toothpaste or mouthwash.  Use  CHG Soap or wipes as directed on instruction sheet. Do not use on breasts if planning to breastfeed.  Do not wear lotions, powders, or perfumes.   Do not shave body hair from the neck down 48 hours before surgery.  Wear comfortable clothing (specific to your surgery type) to the hospital.  Do not wear jewelry, make-up, hairpins, clips or nail polish.  Contact lenses, hearing aids and dentures may not be worn into surgery.  Do not bring valuables to the hospital. [facility] is not responsible for any missing/lost belongings or valuables.   Notify your doctor if there is any change in your medical condition (cold, fever, infection).  If you are being discharged the day of surgery, you will not be allowed to drive home. You will need a responsible individual to drive you home and stay with you for 24 hours after surgery.   If you are taking public transportation, you will need to have a responsible individual with you.  If you are being admitted to the hospital overnight, leave your suitcase in the car. After surgery it may be brought to your room.  In case of increased patient census, it may be necessary for you, the patient, to continue your postoperative care in the Same Day Surgery department.  After surgery, you can help prevent lung complications by doing breathing exercises.  Take deep breaths and cough every 1-2 hours. Your doctor may order a device called an Incentive Spirometer to help you take deep breaths. When coughing or sneezing, hold a pillow firmly  against your incision with both hands. This is called "splinting." Doing this helps protect your incision. It also decreases belly discomfort.  Surgery Visitation Policy:  Patients undergoing a surgery or procedure may have two family members or support persons with them as long as the person is not COVID-19 positive or experiencing its symptoms.   Inpatient Visitation:    Visiting hours are 7 a.m. to 8 p.m. Up to four  visitors are allowed at one time in a patient room. The visitors may rotate out with other people during the day. One designated support person (adult) may remain overnight.  Please call the Pre-admissions Testing Dept. at [phone] if you have any questions about these instructions.     Preparing for Surgery with CHLORHEXIDINE GLUCONATE (CHG) Soap  Chlorhexidine Gluconate (CHG) Soap  o An antiseptic cleaner that kills germs and bonds with the skin to continue killing germs even after washing  o Used for showering the night before surgery and morning of surgery  Before surgery, you can play an important role by reducing the number of germs on your skin.  CHG (Chlorhexidine gluconate) soap is an antiseptic cleanser which kills germs and bonds with the skin to continue killing germs even after washing.  Please do not use if you have an allergy to CHG or antibacterial soaps. If your skin becomes reddened/irritated stop using the CHG.  1. Shower the NIGHT BEFORE SURGERY and the MORNING OF SURGERY with CHG soap.  2. If you choose to wash your hair, wash your hair first as usual with your normal shampoo.  3. After shampooing, rinse your hair and body thoroughly to remove the shampoo.  4. Use CHG as you would any other liquid soap. You can apply CHG directly to the skin and wash gently with a scrungie or a clean washcloth.  5. Apply the CHG soap to your body only from the neck down. Do not use on open wounds or open sores. Avoid contact with your eyes, ears, mouth, and genitals (private parts). Wash face and genitals (private parts) with your normal soap.  6. Wash thoroughly, paying special attention to the area where your surgery will be performed.  7. Thoroughly rinse your body with warm water.  8. Do not shower/wash with your normal soap after using and rinsing off the CHG soap.  9. Pat yourself dry with a clean towel.  10. Wear clean pajamas to bed the night before  surgery.  12. Place clean sheets on your bed the night of your first shower and do not sleep with pets.  13. Shower again with the CHG soap on the day of surgery prior to arriving at the hospital.  14. Do not apply any deodorants/lotions/powders.  15. Please wear clean clothes to the hospital.

## 2023-04-01 ENCOUNTER — Encounter: Payer: Self-pay | Admitting: Obstetrics and Gynecology

## 2023-04-01 ENCOUNTER — Other Ambulatory Visit: Payer: Self-pay

## 2023-04-01 ENCOUNTER — Observation Stay
Admission: EM | Admit: 2023-04-01 | Discharge: 2023-04-01 | Disposition: A | Discharge status: Home / Self Care | Payer: No Typology Code available for payment source | Source: Home / Self Care | Admitting: Obstetrics and Gynecology

## 2023-04-01 DIAGNOSIS — O133 Gestational [pregnancy-induced] hypertension without significant proteinuria, third trimester: Secondary | ICD-10-CM | POA: Insufficient documentation

## 2023-04-01 DIAGNOSIS — Z7982 Long term (current) use of aspirin: Secondary | ICD-10-CM | POA: Insufficient documentation

## 2023-04-01 DIAGNOSIS — O26893 Other specified pregnancy related conditions, third trimester: Secondary | ICD-10-CM | POA: Diagnosis present

## 2023-04-01 DIAGNOSIS — Z79899 Other long term (current) drug therapy: Secondary | ICD-10-CM | POA: Insufficient documentation

## 2023-04-01 DIAGNOSIS — O24419 Gestational diabetes mellitus in pregnancy, unspecified control: Secondary | ICD-10-CM | POA: Insufficient documentation

## 2023-04-01 DIAGNOSIS — Z3A38 38 weeks gestation of pregnancy: Secondary | ICD-10-CM | POA: Insufficient documentation

## 2023-04-01 DIAGNOSIS — R519 Headache, unspecified: Secondary | ICD-10-CM | POA: Diagnosis present

## 2023-04-01 LAB — PROTEIN / CREATININE RATIO, URINE
Creatinine, Urine: 107 mg/dL
Protein Creatinine Ratio: 0.26 mg/mg{Cre} — ABNORMAL HIGH (ref 0.00–0.15)
Total Protein, Urine: 28 mg/dL

## 2023-04-01 LAB — CBC
HCT: 32.8 % — ABNORMAL LOW (ref 36.0–46.0)
Hemoglobin: 10.9 g/dL — ABNORMAL LOW (ref 12.0–15.0)
MCH: 28.3 pg (ref 26.0–34.0)
MCHC: 33.2 g/dL (ref 30.0–36.0)
MCV: 85.2 fL (ref 80.0–100.0)
Platelets: 221 10*3/uL (ref 150–400)
RBC: 3.85 MIL/uL — ABNORMAL LOW (ref 3.87–5.11)
RDW: 15.4 % (ref 11.5–15.5)
WBC: 11.4 10*3/uL — ABNORMAL HIGH (ref 4.0–10.5)
nRBC: 0 % (ref 0.0–0.2)

## 2023-04-01 LAB — COMPREHENSIVE METABOLIC PANEL
ALT: 12 U/L (ref 0–44)
AST: 25 U/L (ref 15–41)
Albumin: 2.9 g/dL — ABNORMAL LOW (ref 3.5–5.0)
Alkaline Phosphatase: 97 U/L (ref 38–126)
Anion gap: 9 (ref 5–15)
BUN: 7 mg/dL (ref 6–20)
CO2: 22 mmol/L (ref 22–32)
Calcium: 8.4 mg/dL — ABNORMAL LOW (ref 8.9–10.3)
Chloride: 107 mmol/L (ref 98–111)
Creatinine, Ser: 0.69 mg/dL (ref 0.44–1.00)
GFR, Estimated: >60 mL/min (ref >60)
Glucose, Bld: 80 mg/dL (ref 70–99)
Potassium: 3.1 mmol/L — ABNORMAL LOW (ref 3.5–5.1)
Sodium: 138 mmol/L (ref 135–145)
Total Bilirubin: 0.3 mg/dL (ref 0.3–1.2)
Total Protein: 6.4 g/dL — ABNORMAL LOW (ref 6.5–8.1)

## 2023-04-01 MED ORDER — ZOLPIDEM TARTRATE 5 MG PO TABS: 5.0000 mg | ORAL_TABLET | Freq: Every evening | ORAL | Status: DC | PRN

## 2023-04-01 MED ORDER — LACTATED RINGERS IV SOLN: 125.0000 mL/h | INTRAVENOUS | Status: DC

## 2023-04-01 MED ORDER — ACETAMINOPHEN 325 MG PO TABS: 650.0000 mg | ORAL_TABLET | ORAL | Status: DC | PRN

## 2023-04-01 MED ORDER — CALCIUM CARBONATE ANTACID 500 MG PO CHEW: 2.0000 | CHEWABLE_TABLET | ORAL | Status: DC | PRN

## 2023-04-01 NOTE — OB Triage Note (Signed)
Patient arrived to the unit from the office for [facility] follow up. Patient currently denies HA, visual changes, epigastric pain, decreased FM, vaginal bleeding, or LOF. EFM and TOCO will be explained and applied to soft, non-tender abdomen. CNM aware of patient's arrival and will place orders.

## 2023-04-01 NOTE — Discharge Summary (Signed)
Natalie Welch is a 32 y.o. female. She is at [redacted]w[redacted]d gestation. No LMP recorded (lmp unknown). Patient is pregnant. Estimated Date of Delivery: 04/11/23  Prenatal care site: [facility]   Current pregnancy complicated by:  - previous cesarean section - ? GDM - Anxiety - H/o gHTN - Palpitations  Chief complaint: elevated BP  She was sent from the office for elevated blood pressures.  S: Resting comfortably. no CTX, no VB.no LOF,  Active fetal movement.  Denies: HA, visual changes, SOB, or RUQ/epigastric pain  Maternal Medical History:   Past Medical History:  Diagnosis Date   Allergy    Anemia    Anxiety    no meds   Complication of anesthesia    Depression    GERD (gastroesophageal reflux disease)    Gestational diabetes    PONV (postoperative nausea and vomiting)     Past Surgical History:  Procedure Laterality Date   CESAREAN SECTION N/A 02/07/2020   Procedure: CESAREAN SECTION;  Surgeon: Conard Novak, MD;  Location: [facility] ORS;  Service: Obstetrics;  Laterality: N/A;   CHOLECYSTECTOMY  06/17/2021   DILATION AND EVACUATION N/A 12/30/2018   Procedure: DILATATION AND EVACUATION;  Surgeon: Vena Austria, MD;  Location: [facility] ORS;  Service: Gynecology;  Laterality: N/A;   ENDOSCOPIC RETROGRADE CHOLANGIOPANCREATOGRAPHY (ERCP) WITH PROPOFOL N/A 06/17/2021   Procedure: ENDOSCOPIC RETROGRADE CHOLANGIOPANCREATOGRAPHY (ERCP) WITH PROPOFOL;  Surgeon: Iva Boop, MD;  Location: [facility] ENDOSCOPY;  Service: Endoscopy;  Laterality: N/A;   REMOVAL OF STONES  06/17/2021   Procedure: REMOVAL OF STONES;  Surgeon: Iva Boop, MD;  Location: [facility] ENDOSCOPY;  Service: Endoscopy;;   SPHINCTEROTOMY  06/17/2021   Procedure: Dennison Mascot;  Surgeon: Iva Boop, MD;  Location: [facility] ENDOSCOPY;  Service: Endoscopy;;   WISDOM TOOTH EXTRACTION      Allergies  Allergen Reactions   Abilify [Aripiprazole] Other (See Comments)    Oculogyric crisis --eyes rolled in back  of head   Ibuprofen Nausea And Vomiting    Only with higher strengths -- i.e., 600mg    Nuvigil [Armodafinil] Swelling    Tongue and throat swelling    Prior to Admission medications   Medication Sig Start Date End Date Taking? Authorizing Provider  Blood Glucose Monitoring Suppl (ACCU-CHEK GUIDE) w/Device KIT See admin instructions. 11/19/22  Yes [provider]  Continuous Blood Gluc Receiver (DEXCOM G6 RECEIVER) DEVI  11/26/22  Yes [provider]  Doxylamine-Pyridoxine 10-10 MG TBEC Take 2 tablets by mouth at bedtime.   Yes [provider]  famotidine (PEPCID) 20 MG tablet Take 20 mg by mouth daily.   Yes [provider]  Ferrous Sulfate Dried (SLOW RELEASE IRON) 45 MG TBCR Take 1 tablet by mouth daily.   Yes [provider]  ondansetron (ZOFRAN-ODT) 4 MG disintegrating tablet Take 4 mg by mouth every 8 (eight) hours as needed.   Yes [provider]  aspirin EC 81 MG tablet Take 81 mg by mouth daily.    [provider]  Prenatal Vit-Fe Fumarate-FA (PRENATAL MULTIVITAMIN) TABS tablet Take 1 tablet by mouth daily at 12 noon.    [provider]      Social History: She  reports that she has never smoked. She has never used smokeless tobacco. She reports that she does not currently use alcohol after a past usage of about 7.0 standard drinks of alcohol per week. She reports that she does not use drugs.  Family History: family history includes Diabetes in her paternal  grandmother; Healthy in her father; Hypertension in her mother.  no history of gyn cancers  Review of Systems: A full review of systems was performed and negative except as noted in the HPI.     O:  BP 134/77 (BP Location: Right Arm)   Pulse 87   Temp 98.2 F (36.8 C) (Oral)   Resp 18   Wt 80.7 kg   LMP  (LMP Unknown)   BMI 33.63 kg/m  Results for orders placed or performed during the hospital encounter of 04/01/23 (from the past 48 hour(s))   Protein / creatinine ratio, urine   Collection Time: 04/01/23  9:26 AM  Result Value Ref Range   Creatinine, Urine 107 mg/dL   Total Protein, Urine 28 mg/dL   Protein Creatinine Ratio 0.26 (H) 0.00 - 0.15 mg/mg[Cre]  CBC   Collection Time: 04/01/23  9:35 AM  Result Value Ref Range   WBC 11.4 (H) 4.0 - 10.5 K/uL   RBC 3.85 (L) 3.87 - 5.11 MIL/uL   Hemoglobin 10.9 (L) 12.0 - 15.0 g/dL   HCT 78.2 (L) 95.6 - 21.3 %   MCV 85.2 80.0 - 100.0 fL   MCH 28.3 26.0 - 34.0 pg   MCHC 33.2 30.0 - 36.0 g/dL   RDW 08.6 57.8 - 46.9 %   Platelets 221 150 - 400 K/uL   nRBC 0.0 0.0 - 0.2 %  Comprehensive metabolic panel   Collection Time: 04/01/23  9:35 AM  Result Value Ref Range   Sodium 138 135 - 145 mmol/L   Potassium 3.1 (L) 3.5 - 5.1 mmol/L   Chloride 107 98 - 111 mmol/L   CO2 22 22 - 32 mmol/L   Glucose, Bld 80 70 - 99 mg/dL   BUN 7 6 - 20 mg/dL   Creatinine, Ser 6.29 0.44 - 1.00 mg/dL   Calcium 8.4 (L) 8.9 - 10.3 mg/dL   Total Protein 6.4 (L) 6.5 - 8.1 g/dL   Albumin 2.9 (L) 3.5 - 5.0 g/dL   AST 25 15 - 41 U/L   ALT 12 0 - 44 U/L   Alkaline Phosphatase 97 38 - 126 U/L   Total Bilirubin 0.3 0.3 - 1.2 mg/dL   GFR, Estimated >52 >84 mL/min   Anion gap 9 5 - 15    Vitals:   04/01/23 0930 04/01/23 0945 04/01/23 1000 04/01/23 1015  BP: (!) 139/98 (!) 145/87 (!) 141/86 132/86   04/01/23 1030 04/01/23 1045 04/01/23 1100 04/01/23 1115  BP: 132/88 133/76 134/73 126/84   04/01/23 1140  BP: 134/77     Constitutional: NAD, AAOx3  HE/ENT: extraocular movements grossly intact, moist mucous membranes CV: RRR PULM: nl respiratory effort, CTABL     Abd: gravid, non-tender, non-distended, soft      Ext: Non-tender, Nonedematous   Psych: mood appropriate, speech normal Pelvic: deferred  Fetal  monitoring: Cat 1 Appropriate for GA Baseline: 130bpm Variability: moderate Accelerations: present x >2 Decelerations absent  A/P: 32 y.o. [redacted]w[redacted]d here for antenatal surveillance for elevated  BP  Principle Diagnosis:  Normal pregnancy, 3rd trimester  PIH: not present. BP went back to normal. Normal labs. Reviewed PIH warning signs. Advised to check BP 1-2x/day and discussed warning signs and when to return. Labor: not present.  Fetal Wellbeing: Reassuring Cat 1 tracing. Reactive NST  D/c home stable, precautions reviewed, follow-up as scheduled.    Janyce Llanos, CNM 04/01/2023 11:50 AM

## 2023-04-02 ENCOUNTER — Inpatient Hospital Stay
Admission: EM | Admit: 2023-04-02 | Discharge: 2023-04-06 | DRG: 784 | Disposition: A | Discharge status: Home / Self Care | Payer: No Typology Code available for payment source | Attending: Obstetrics | Admitting: Obstetrics

## 2023-04-02 ENCOUNTER — Encounter: Payer: Self-pay | Admitting: Obstetrics and Gynecology

## 2023-04-02 ENCOUNTER — Other Ambulatory Visit: Payer: Self-pay

## 2023-04-02 DIAGNOSIS — Z3A38 38 weeks gestation of pregnancy: Secondary | ICD-10-CM

## 2023-04-02 DIAGNOSIS — O134 Gestational [pregnancy-induced] hypertension without significant proteinuria, complicating childbirth: Principal | ICD-10-CM | POA: Diagnosis present

## 2023-04-02 DIAGNOSIS — D62 Acute posthemorrhagic anemia: Secondary | ICD-10-CM | POA: Diagnosis not present

## 2023-04-02 DIAGNOSIS — O3413 Maternal care for benign tumor of corpus uteri, third trimester: Secondary | ICD-10-CM | POA: Diagnosis present

## 2023-04-02 DIAGNOSIS — O24420 Gestational diabetes mellitus in childbirth, diet controlled: Secondary | ICD-10-CM | POA: Diagnosis present

## 2023-04-02 DIAGNOSIS — Z8249 Family history of ischemic heart disease and other diseases of the circulatory system: Secondary | ICD-10-CM

## 2023-04-02 DIAGNOSIS — O34211 Maternal care for low transverse scar from previous cesarean delivery: Secondary | ICD-10-CM | POA: Diagnosis present

## 2023-04-02 DIAGNOSIS — Z302 Encounter for sterilization: Secondary | ICD-10-CM

## 2023-04-02 DIAGNOSIS — O139 Gestational [pregnancy-induced] hypertension without significant proteinuria, unspecified trimester: Secondary | ICD-10-CM | POA: Diagnosis present

## 2023-04-02 DIAGNOSIS — O9081 Anemia of the puerperium: Secondary | ICD-10-CM | POA: Diagnosis not present

## 2023-04-02 DIAGNOSIS — Z7982 Long term (current) use of aspirin: Secondary | ICD-10-CM

## 2023-04-02 DIAGNOSIS — D259 Leiomyoma of uterus, unspecified: Secondary | ICD-10-CM | POA: Diagnosis present

## 2023-04-02 DIAGNOSIS — Z98891 History of uterine scar from previous surgery: Secondary | ICD-10-CM

## 2023-04-02 DIAGNOSIS — O24410 Gestational diabetes mellitus in pregnancy, diet controlled: Secondary | ICD-10-CM | POA: Diagnosis present

## 2023-04-02 HISTORY — DX: Obstruction of bile duct: K83.1

## 2023-04-02 LAB — PROTEIN / CREATININE RATIO, URINE
Creatinine, Urine: 187 mg/dL
Protein Creatinine Ratio: 0.25 mg/mg{Cre} — ABNORMAL HIGH (ref 0.00–0.15)
Total Protein, Urine: 46 mg/dL

## 2023-04-02 NOTE — OB Triage Note (Signed)
Pt arrives here with concerns about rising BP. Pt denies bleeding or fluid leaking.

## 2023-04-03 ENCOUNTER — Encounter
Admission: EM | Disposition: A | Discharge status: Home / Self Care | Payer: Self-pay | Source: Home / Self Care | Attending: Obstetrics

## 2023-04-03 ENCOUNTER — Encounter: Payer: Self-pay | Admitting: Obstetrics and Gynecology

## 2023-04-03 ENCOUNTER — Inpatient Hospital Stay: Admission: RE | Admit: 2023-04-03 | Payer: 59 | Source: Ambulatory Visit

## 2023-04-03 ENCOUNTER — Inpatient Hospital Stay: Payer: No Typology Code available for payment source | Admitting: Urgent Care

## 2023-04-03 DIAGNOSIS — O9081 Anemia of the puerperium: Secondary | ICD-10-CM | POA: Diagnosis not present

## 2023-04-03 DIAGNOSIS — D62 Acute posthemorrhagic anemia: Secondary | ICD-10-CM | POA: Diagnosis not present

## 2023-04-03 DIAGNOSIS — Z8249 Family history of ischemic heart disease and other diseases of the circulatory system: Secondary | ICD-10-CM | POA: Diagnosis not present

## 2023-04-03 DIAGNOSIS — D259 Leiomyoma of uterus, unspecified: Secondary | ICD-10-CM | POA: Diagnosis present

## 2023-04-03 DIAGNOSIS — Z3A38 38 weeks gestation of pregnancy: Secondary | ICD-10-CM | POA: Diagnosis not present

## 2023-04-03 DIAGNOSIS — Z7982 Long term (current) use of aspirin: Secondary | ICD-10-CM | POA: Diagnosis not present

## 2023-04-03 DIAGNOSIS — O134 Gestational [pregnancy-induced] hypertension without significant proteinuria, complicating childbirth: Secondary | ICD-10-CM | POA: Diagnosis present

## 2023-04-03 DIAGNOSIS — O24420 Gestational diabetes mellitus in childbirth, diet controlled: Secondary | ICD-10-CM | POA: Diagnosis present

## 2023-04-03 DIAGNOSIS — O3413 Maternal care for benign tumor of corpus uteri, third trimester: Secondary | ICD-10-CM | POA: Diagnosis present

## 2023-04-03 DIAGNOSIS — O34211 Maternal care for low transverse scar from previous cesarean delivery: Secondary | ICD-10-CM | POA: Diagnosis present

## 2023-04-03 DIAGNOSIS — Z302 Encounter for sterilization: Secondary | ICD-10-CM | POA: Diagnosis not present

## 2023-04-03 DIAGNOSIS — Z98891 History of uterine scar from previous surgery: Secondary | ICD-10-CM

## 2023-04-03 LAB — CBC WITH DIFFERENTIAL/PLATELET
Abs Immature Granulocytes: 0.06 10*3/uL (ref 0.00–0.07)
Abs Immature Granulocytes: 0.05 10*3/uL (ref 0.00–0.07)
Basophils Absolute: 0 10*3/uL (ref 0.0–0.1)
Basophils Absolute: 0 10*3/uL (ref 0.0–0.1)
Basophils Relative: 0 %
Basophils Relative: 0 %
Eosinophils Absolute: 0.1 10*3/uL (ref 0.0–0.5)
Eosinophils Absolute: 0.1 10*3/uL (ref 0.0–0.5)
Eosinophils Relative: 1 %
Eosinophils Relative: 1 %
HCT: 29.4 % — ABNORMAL LOW (ref 36.0–46.0)
HCT: 31.5 % — ABNORMAL LOW (ref 36.0–46.0)
Hemoglobin: 9.9 g/dL — ABNORMAL LOW (ref 12.0–15.0)
Hemoglobin: 10.3 g/dL — ABNORMAL LOW (ref 12.0–15.0)
Immature Granulocytes: 1 %
Immature Granulocytes: 1 %
Lymphocytes Relative: 22 %
Lymphocytes Relative: 22 %
Lymphs Abs: 2.4 10*3/uL (ref 0.7–4.0)
Lymphs Abs: 1.9 10*3/uL (ref 0.7–4.0)
MCH: 28.9 pg (ref 26.0–34.0)
MCH: 28.1 pg (ref 26.0–34.0)
MCHC: 33.7 g/dL (ref 30.0–36.0)
MCHC: 32.7 g/dL (ref 30.0–36.0)
MCV: 86 fL (ref 80.0–100.0)
MCV: 86.1 fL (ref 80.0–100.0)
Monocytes Absolute: 0.9 10*3/uL (ref 0.1–1.0)
Monocytes Absolute: 0.8 10*3/uL (ref 0.1–1.0)
Monocytes Relative: 9 %
Monocytes Relative: 10 %
Neutro Abs: 7.1 10*3/uL (ref 1.7–7.7)
Neutro Abs: 5.8 10*3/uL (ref 1.7–7.7)
Neutrophils Relative %: 67 %
Neutrophils Relative %: 66 %
Platelets: 192 10*3/uL (ref 150–400)
Platelets: 184 10*3/uL (ref 150–400)
RBC: 3.42 MIL/uL — ABNORMAL LOW (ref 3.87–5.11)
RBC: 3.66 MIL/uL — ABNORMAL LOW (ref 3.87–5.11)
RDW: 15.2 % (ref 11.5–15.5)
RDW: 15.2 % (ref 11.5–15.5)
WBC: 10.5 10*3/uL (ref 4.0–10.5)
WBC: 8.7 10*3/uL (ref 4.0–10.5)
nRBC: 0 % (ref 0.0–0.2)
nRBC: 0 % (ref 0.0–0.2)

## 2023-04-03 LAB — MAGNESIUM: Magnesium: 1.7 mg/dL (ref 1.7–2.4)

## 2023-04-03 LAB — COMPREHENSIVE METABOLIC PANEL
ALT: 14 U/L (ref 0–44)
ALT: 15 U/L (ref 0–44)
AST: 35 U/L (ref 15–41)
AST: 26 U/L (ref 15–41)
Albumin: 2.6 g/dL — ABNORMAL LOW (ref 3.5–5.0)
Albumin: 2.5 g/dL — ABNORMAL LOW (ref 3.5–5.0)
Alkaline Phosphatase: 91 U/L (ref 38–126)
Alkaline Phosphatase: 82 U/L (ref 38–126)
Anion gap: 9 (ref 5–15)
Anion gap: 6 (ref 5–15)
BUN: 7 mg/dL (ref 6–20)
BUN: 5 mg/dL — ABNORMAL LOW (ref 6–20)
CO2: 21 mmol/L — ABNORMAL LOW (ref 22–32)
CO2: 23 mmol/L (ref 22–32)
Calcium: 8.4 mg/dL — ABNORMAL LOW (ref 8.9–10.3)
Calcium: 8.2 mg/dL — ABNORMAL LOW (ref 8.9–10.3)
Chloride: 107 mmol/L (ref 98–111)
Chloride: 110 mmol/L (ref 98–111)
Creatinine, Ser: 0.67 mg/dL (ref 0.44–1.00)
Creatinine, Ser: 0.62 mg/dL (ref 0.44–1.00)
GFR, Estimated: >60 mL/min (ref >60)
GFR, Estimated: >60 mL/min (ref >60)
Glucose, Bld: 114 mg/dL — ABNORMAL HIGH (ref 70–99)
Glucose, Bld: 87 mg/dL (ref 70–99)
Potassium: 2.6 mmol/L — CL (ref 3.5–5.1)
Potassium: 3.1 mmol/L — ABNORMAL LOW (ref 3.5–5.1)
Sodium: 137 mmol/L (ref 135–145)
Sodium: 139 mmol/L (ref 135–145)
Total Bilirubin: 0.3 mg/dL (ref 0.3–1.2)
Total Bilirubin: 0.3 mg/dL (ref 0.3–1.2)
Total Protein: 5.9 g/dL — ABNORMAL LOW (ref 6.5–8.1)
Total Protein: 5.7 g/dL — ABNORMAL LOW (ref 6.5–8.1)

## 2023-04-03 LAB — GLUCOSE, CAPILLARY: Glucose-Capillary: 69 mg/dL — ABNORMAL LOW (ref 70–99)

## 2023-04-03 LAB — RAPID HIV SCREEN (HIV 1/2 AB+AG)
HIV 1/2 Antibodies: REACTIVE — AB
HIV-1 P24 Antigen - HIV24: NONREACTIVE

## 2023-04-03 LAB — CHLAMYDIA/NGC RT PCR (ARMC ONLY)
Chlamydia Tr: NOT DETECTED
N gonorrhoeae: NOT DETECTED

## 2023-04-03 LAB — RPR: RPR Ser Ql: NONREACTIVE

## 2023-04-03 SURGERY — Surgical Case
Anesthesia: Spinal | Laterality: Bilateral

## 2023-04-03 MED ORDER — KETOROLAC TROMETHAMINE 30 MG/ML IJ SOLN
30.0000 mg | Freq: Four times a day (QID) | INTRAMUSCULAR | Status: AC
  Filled 2023-04-03 (×2): qty 1

## 2023-04-03 MED ORDER — NALOXONE HCL 4 MG/10ML IJ SOLN: 1.0000 ug/kg/h | INTRAVENOUS | Status: DC | PRN

## 2023-04-03 MED ORDER — BUPIVACAINE HCL (PF) 0.5 % IJ SOLN
INTRAMUSCULAR | Status: AC
  Filled 2023-04-03: qty 30

## 2023-04-03 MED ORDER — BUPIVACAINE 0.25 % ON-Q PUMP DUAL CATH 400 ML
400.0000 mL | INJECTION | Status: DC
  Filled 2023-04-03: qty 400

## 2023-04-03 MED ORDER — MEPERIDINE HCL 25 MG/ML IJ SOLN: 6.2500 mg | INTRAMUSCULAR | Status: DC | PRN

## 2023-04-03 MED ORDER — KETOROLAC TROMETHAMINE 30 MG/ML IJ SOLN
30.0000 mg | Freq: Four times a day (QID) | INTRAMUSCULAR | Status: AC
  Administered 2023-04-04 (×4): 30 mg via INTRAVENOUS
  Filled 2023-04-03 (×2): qty 1

## 2023-04-03 MED ORDER — OXYTOCIN-SODIUM CHLORIDE 30-0.9 UT/500ML-% IV SOLN
INTRAVENOUS | Status: DC | PRN
  Administered 2023-04-03: 250 mL/h via INTRAVENOUS

## 2023-04-03 MED ORDER — KETOROLAC TROMETHAMINE 30 MG/ML IJ SOLN
INTRAMUSCULAR | Status: AC
  Filled 2023-04-03: qty 1

## 2023-04-03 MED ORDER — METOCLOPRAMIDE HCL 5 MG/ML IJ SOLN
10.0000 mg | Freq: Once | INTRAMUSCULAR | Status: AC
  Administered 2023-04-03: 10 mg via INTRAVENOUS
  Filled 2023-04-03: qty 2

## 2023-04-03 MED ORDER — SOD CITRATE-CITRIC ACID 500-334 MG/5ML PO SOLN
ORAL | Status: AC
  Administered 2023-04-03: 30 mL via ORAL
  Filled 2023-04-03: qty 15

## 2023-04-03 MED ORDER — PHENYLEPHRINE 80 MCG/ML (10ML) SYRINGE FOR IV PUSH (FOR BLOOD PRESSURE SUPPORT)
PREFILLED_SYRINGE | INTRAVENOUS | Status: DC | PRN
  Administered 2023-04-03: 160 ug via INTRAVENOUS

## 2023-04-03 MED ORDER — SODIUM CHLORIDE 0.9% FLUSH: 3.0000 mL | INTRAVENOUS | Status: DC | PRN

## 2023-04-03 MED ORDER — ZIDOVUDINE 10 MG/ML IV SOLN
2.0000 mg/kg | Freq: Once | INTRAVENOUS | Status: AC
  Administered 2023-04-03: 161 mg via INTRAVENOUS
  Filled 2023-04-03: qty 16.1

## 2023-04-03 MED ORDER — ACETAMINOPHEN 500 MG PO TABS
1000.0000 mg | ORAL_TABLET | Freq: Four times a day (QID) | ORAL | Status: AC
  Administered 2023-04-04 (×3): 1000 mg via ORAL
  Filled 2023-04-03 (×4): qty 2

## 2023-04-03 MED ORDER — ACETAMINOPHEN 325 MG PO TABS
ORAL_TABLET | ORAL | Status: AC
  Administered 2023-04-03: 650 mg via ORAL
  Filled 2023-04-03: qty 2

## 2023-04-03 MED ORDER — NALOXONE HCL 0.4 MG/ML IJ SOLN: 0.4000 mg | INTRAMUSCULAR | Status: DC | PRN

## 2023-04-03 MED ORDER — POTASSIUM CHLORIDE 10 MEQ/100ML IV SOLN
10.0000 meq | INTRAVENOUS | Status: AC
  Administered 2023-04-03 (×3): 10 meq via INTRAVENOUS
  Filled 2023-04-03 (×3): qty 100

## 2023-04-03 MED ORDER — FENTANYL CITRATE (PF) 100 MCG/2ML IJ SOLN
INTRAMUSCULAR | Status: AC
  Filled 2023-04-03: qty 2

## 2023-04-03 MED ORDER — BUPIVACAINE IN DEXTROSE 0.75-8.25 % IT SOLN
INTRATHECAL | Status: DC | PRN
  Administered 2023-04-03: 1.6 mL via INTRATHECAL

## 2023-04-03 MED ORDER — BUPIVACAINE HCL (PF) 0.5 % IJ SOLN
5.0000 mL | Freq: Once | INTRAMUSCULAR | Status: DC
  Filled 2023-04-03 (×2): qty 10

## 2023-04-03 MED ORDER — DIPHENHYDRAMINE HCL 50 MG/ML IJ SOLN
12.5000 mg | INTRAMUSCULAR | Status: DC | PRN
  Administered 2023-04-04 (×2): 12.5 mg via INTRAVENOUS
  Filled 2023-04-03 (×2): qty 1

## 2023-04-03 MED ORDER — SOD CITRATE-CITRIC ACID 500-334 MG/5ML PO SOLN: 30.0000 mL | ORAL | Status: AC

## 2023-04-03 MED ORDER — SODIUM CHLORIDE 0.9 % IV SOLN
500.0000 mg | INTRAVENOUS | Status: AC
  Administered 2023-04-03: 500 mg via INTRAVENOUS
  Filled 2023-04-03: qty 5

## 2023-04-03 MED ORDER — KETOROLAC TROMETHAMINE 30 MG/ML IJ SOLN
INTRAMUSCULAR | Status: DC | PRN
  Administered 2023-04-03: 30 mg via INTRAVENOUS

## 2023-04-03 MED ORDER — FENTANYL CITRATE (PF) 100 MCG/2ML IJ SOLN
INTRAMUSCULAR | Status: DC | PRN
  Administered 2023-04-03: 10 ug via INTRATHECAL

## 2023-04-03 MED ORDER — ACETAMINOPHEN 325 MG PO TABS: 650.0000 mg | ORAL_TABLET | Freq: Four times a day (QID) | ORAL | Status: DC | PRN

## 2023-04-03 MED ORDER — DIPHENHYDRAMINE HCL 25 MG PO CAPS
25.0000 mg | ORAL_CAPSULE | ORAL | Status: DC | PRN
  Administered 2023-04-04 (×2): 25 mg via ORAL
  Filled 2023-04-03 (×2): qty 1

## 2023-04-03 MED ORDER — MORPHINE SULFATE (PF) 0.5 MG/ML IJ SOLN
INTRAMUSCULAR | Status: AC
  Filled 2023-04-03: qty 10

## 2023-04-03 MED ORDER — CEFAZOLIN SODIUM-DEXTROSE 2-4 GM/100ML-% IV SOLN
2.0000 g | INTRAVENOUS | Status: AC
  Administered 2023-04-03: 2 g via INTRAVENOUS
  Filled 2023-04-03: qty 100

## 2023-04-03 MED ORDER — PHENYLEPHRINE HCL-NACL 20-0.9 MG/250ML-% IV SOLN
INTRAVENOUS | Status: DC | PRN
  Administered 2023-04-03: 40 ug/min via INTRAVENOUS

## 2023-04-03 MED ORDER — METOCLOPRAMIDE HCL 5 MG/ML IJ SOLN
INTRAMUSCULAR | Status: DC | PRN
  Administered 2023-04-03: 5 mg via INTRAVENOUS

## 2023-04-03 MED ORDER — POTASSIUM CHLORIDE 10 MEQ/100ML IV SOLN
10.0000 meq | INTRAVENOUS | Status: AC
  Administered 2023-04-03 (×2): 10 meq via INTRAVENOUS
  Filled 2023-04-03 (×2): qty 100

## 2023-04-03 MED ORDER — ONDANSETRON HCL 4 MG/2ML IJ SOLN
4.0000 mg | Freq: Three times a day (TID) | INTRAMUSCULAR | Status: DC | PRN
  Administered 2023-04-03: 4 mg via INTRAVENOUS
  Filled 2023-04-03: qty 2

## 2023-04-03 MED ORDER — OXYCODONE HCL 5 MG PO TABS: 5.0000 mg | ORAL_TABLET | ORAL | Status: AC | PRN

## 2023-04-03 MED ORDER — LIDOCAINE HCL (PF) 1 % IJ SOLN
INTRAMUSCULAR | Status: DC | PRN
  Administered 2023-04-03: 3 mL via SUBCUTANEOUS

## 2023-04-03 MED ORDER — DEXAMETHASONE SODIUM PHOSPHATE 10 MG/ML IJ SOLN
INTRAMUSCULAR | Status: DC | PRN
  Administered 2023-04-03: 10 mg via INTRAVENOUS

## 2023-04-03 MED ORDER — LACTATED RINGERS IV SOLN: INTRAVENOUS | Status: DC

## 2023-04-03 MED ORDER — BUPIVACAINE HCL (PF) 0.5 % IJ SOLN: 5.0000 mL | Freq: Once | INTRAMUSCULAR | Status: DC

## 2023-04-03 MED ORDER — BICTEGRAVIR-EMTRICITAB-TENOFOV 50-200-25 MG PO TABS
1.0000 | ORAL_TABLET | Freq: Every day | ORAL | Status: DC
  Administered 2023-04-03 – 2023-04-05 (×3): 1 via ORAL
  Filled 2023-04-03 (×4): qty 1

## 2023-04-03 MED ORDER — ZIDOVUDINE 10 MG/ML IV SOLN
1.0000 mg/kg/h | INTRAVENOUS | Status: DC
  Administered 2023-04-03: 1 mg/kg/h via INTRAVENOUS
  Filled 2023-04-03: qty 40

## 2023-04-03 MED ORDER — MORPHINE SULFATE (PF) 0.5 MG/ML IJ SOLN
INTRAMUSCULAR | Status: DC | PRN
  Administered 2023-04-03: 150 ug via INTRATHECAL

## 2023-04-03 MED ORDER — SCOPOLAMINE 1 MG/3DAYS TD PT72
1.0000 | MEDICATED_PATCH | Freq: Once | TRANSDERMAL | Status: DC
  Administered 2023-04-04: 1.5 mg via TRANSDERMAL
  Filled 2023-04-03: qty 1

## 2023-04-03 MED ORDER — ONDANSETRON HCL 4 MG/2ML IJ SOLN
INTRAMUSCULAR | Status: DC | PRN
  Administered 2023-04-03: 4 mg via INTRAVENOUS

## 2023-04-03 MED ORDER — OXYTOCIN-SODIUM CHLORIDE 30-0.9 UT/500ML-% IV SOLN
INTRAVENOUS | Status: AC
  Filled 2023-04-03: qty 1000

## 2023-04-03 SURGICAL SUPPLY — 33 items
ADH SKN CLS APL DERMABOND .7 (GAUZE/BANDAGES/DRESSINGS) ×1
CATH KIT ON-Q SILVERSOAK 5 (CATHETERS) ×2 IMPLANT
CATH KIT ON-Q SILVERSOAK 5IN (CATHETERS) ×2 IMPLANT
DERMABOND ADVANCED .7 DNX12 (GAUZE/BANDAGES/DRESSINGS) ×1 IMPLANT
DRSG OPSITE POSTOP 4X10 (GAUZE/BANDAGES/DRESSINGS) ×1 IMPLANT
DRSG TELFA 3X8 NADH STRL (GAUZE/BANDAGES/DRESSINGS) ×1 IMPLANT
ELECT CAUTERY BLADE 6.4 (BLADE) ×1 IMPLANT
ELECT REM PT RETURN 9FT ADLT (ELECTROSURGICAL) ×1
ELECTRODE REM PT RTRN 9FT ADLT (ELECTROSURGICAL) ×1 IMPLANT
EXTRACTOR VACUUM KIWI (MISCELLANEOUS) IMPLANT
GAUZE SPONGE 4X4 12PLY STRL (GAUZE/BANDAGES/DRESSINGS) ×1 IMPLANT
GLOVE BIO SURGEON STRL SZ7 (GLOVE) ×1 IMPLANT
GLOVE INDICATOR 7.5 STRL GRN (GLOVE) ×1 IMPLANT
GOWN STRL REUS W/ TWL LRG LVL3 (GOWN DISPOSABLE) ×3 IMPLANT
GOWN STRL REUS W/TWL LRG LVL3 (GOWN DISPOSABLE) ×3
MANIFOLD NEPTUNE II (INSTRUMENTS) ×1 IMPLANT
MAT PREVALON FULL STRYKER (MISCELLANEOUS) ×1 IMPLANT
NS IRRIG 1000ML POUR BTL (IV SOLUTION) ×1 IMPLANT
PACK C SECTION AR (MISCELLANEOUS) ×1 IMPLANT
PAD OB MATERNITY 4.3X12.25 (PERSONAL CARE ITEMS) ×2 IMPLANT
PAD PREP OB/GYN DISP 24X41 (PERSONAL CARE ITEMS) ×1 IMPLANT
SCRUB CHG 4% DYNA-HEX 4OZ (MISCELLANEOUS) ×1 IMPLANT
STRIP CLOSURE SKIN 1/2X4 (GAUZE/BANDAGES/DRESSINGS) ×1 IMPLANT
SUT MNCRL 4-0 (SUTURE) ×1
SUT MNCRL 4-0 27XMFL (SUTURE) ×1
SUT PDS AB 1 TP1 96 (SUTURE) ×1 IMPLANT
SUT PLAIN GUT 0 (SUTURE) IMPLANT
SUT VIC AB 0 CTX 36 (SUTURE) ×2
SUT VIC AB 0 CTX36XBRD ANBCTRL (SUTURE) ×2 IMPLANT
SUTURE MNCRL 4-0 27XMF (SUTURE) ×1 IMPLANT
SWABSTK COMLB BENZOIN TINCTURE (MISCELLANEOUS) ×1 IMPLANT
TRAP FLUID SMOKE EVACUATOR (MISCELLANEOUS) ×1 IMPLANT
WATER STERILE IRR 500ML POUR (IV SOLUTION) ×1 IMPLANT

## 2023-04-03 NOTE — Transfer of Care (Signed)
Immediate Anesthesia Transfer of Care Note  Patient: Natalie Welch  Procedure(s) Performed: REPEAT CESAREAN SECTION WITH BILATERAL TUBAL LIGATION (Bilateral)  Patient Location: Mother/Baby  Anesthesia Type:Spinal  Level of Consciousness: awake, alert , and oriented  Airway & Oxygen Therapy: Patient Spontanous Breathing  Post-op Assessment: Report given to RN and Post -op Vital signs reviewed and stable  Post vital signs: Reviewed and stable  Last Vitals:  Vitals Value Taken Time  BP 126/60 04/03/23 2028  Temp 36.4 C 04/03/23 2028  Pulse 78 04/03/23 2028  Resp 20 04/03/23 2028  SpO2 98 % 04/03/23 2028    Last Pain:  Vitals:   04/03/23 2028  TempSrc: Temporal  PainSc: 0-No pain         Complications: No notable events documented.
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
## 2023-04-03 NOTE — Discharge Summary (Signed)
Obstetrical Discharge Summary  Patient Name: Natalie Welch DOB: 1991/02/22 MRN: [id number]  Date of Admission: 04/02/2023 Date of Delivery: 04/03/2023 Delivered by: Dr. Thomasene Mohair Date of Discharge: 04/03/2023  Primary OB: [facility] LMP:No LMP recorded (lmp unknown). Patient is pregnant. EDC Estimated Date of Delivery: 04/11/23 Gestational Age at Delivery: [redacted]w[redacted]d   Antepartum complications:  Gestational hypertension Previous cesarean birth  GDM - diet controlled  History of anxiety and depression  Palpitations  Desires permanent sterilization   Admitting Diagnosis: History of cesarean delivery [Z98.891]  Secondary Diagnosis: Patient Active Problem List   Diagnosis Date Noted   Diet controlled gestational diabetes mellitus (GDM) in third trimester 02/04/2023   Hyperlipidemia 09/28/2020   Gestational hypertension 02/07/2020   Previous cesarean section 02/07/2020   Polyhydramnios affecting pregnancy in third trimester 12/21/2019   Elevated glucose tolerance test 12/06/2019   Anemia during pregnancy 11/16/2019   Generalized anxiety disorder with panic attacks 08/05/2019   Supervision of high risk pregnancy in third trimester 11/23/2018    Discharge Diagnosis: Term Pregnancy Delivered and Gestational Hypertension      Augmentation: N/A Complications: None Intrapartum complications/course: Stefanie presented to L&D with elevated blood pressures.  Decision made to proceed with repeat c/section for gestational hypertension.  Please see OP note for further details.  Delivery Type: repeat cesarean section, low transverse incision Anesthesia: spinal anesthesia Placenta: spontaneous To Pathology: No  Laceration: none Episiotomy: none Newborn Data: Live born female "Parker" Birth Weight:  7lbs 14oz APGAR: 9, 10  Newborn Delivery   Birth date/time: 04/03/2023 19:24:00 Delivery type: C-Section, Low Transverse Trial of labor: No C-section categorization: Repeat      Postpartum Procedures: {postpartum procedures:3041411} Edinburgh:     02/07/2020    8:45 PM  Edinburgh Postnatal Depression Scale Screening Tool  I have been able to laugh and see the funny side of things. 0  I have looked forward with enjoyment to things. 0  I have blamed myself unnecessarily when things went wrong. 2  I have been anxious or worried for no good reason. 2  I have felt scared or panicky for no good reason. 2  Things have been getting on top of me. 2  I have been so unhappy that I have had difficulty sleeping. 1  I have felt sad or miserable. 2  I have been so unhappy that I have been crying. 1  The thought of harming myself has occurred to me. 0  Edinburgh Postnatal Depression Scale Total 12     Post partum course: *** Patient had an uncomplicated postpartum course.  By time of discharge on POD#***, her pain was controlled on oral pain medications; she had appropriate lochia and was ambulating, voiding without difficulty, tolerating regular diet and passing flatus.   She was deemed stable for discharge to home.    Discharge Physical Exam: *** BP (!) 140/75   Pulse 67   Temp 97.9 F (36.6 C) (Oral)   Resp 18   Ht 5\' 1"  (1.549 m)   Wt 80.7 kg   LMP  (LMP Unknown)   BMI 33.62 kg/m   General: NAD CV: RRR Pulm: CTABL, nl effort ABD: s/nd/nt, fundus firm and below the umbilicus Lochia: moderate Perineum: minimal edema/intact Incision: c/d/I, covered with occlusive OP site dressing *** DVT Evaluation: LE non-ttp, no evidence of DVT on exam.  Hemoglobin  Date Value Ref Range Status  04/03/2023 10.3 (L) 12.0 - 15.0 g/dL Final  62/13/0865 78.4 11.1 - 15.9 g/dL Final  HCT  Date Value Ref Range Status  04/03/2023 31.5 (L) 36.0 - 46.0 % Final   Hematocrit  Date Value Ref Range Status  05/23/2021 39.9 34.0 - 46.6 % Final    Risk assessment for postpartum VTE and prophylactic treatment: Very high risk factors: None High risk factors: None Moderate risk  factors: Cesarean delivery  and BMI 30-40 kg/m2  Postpartum VTE prophylaxis with LMWH not indicated  Disposition: stable, discharge to home. Baby Feeding: {AMINFANTFEED:27794} Baby Disposition: home with mom  Rh Immune globulin indicated: No Rubella vaccine given: {ACTIONS; WAS GIVEN/WAS NOT INDICATED:16401::"was not indicated"} Varivax vaccine given: was not indicated Flu vaccine given in AP setting: No Tdap vaccine given in AP setting: No  Contraception: bilateral tubal ligation - completed during c/section   Prenatal Labs:  Blood type/Rh O POS   Antibody screen neg  Rubella Non-immune    Varicella Immune  RPR NR  HBsAg Neg  Hep C NR  HIV NR  GC neg  Chlamydia neg  Genetic screening AFP  1 hour GTT 165  3 hour GTT 77, 167, 166, 120   GBS Negative      Plan:  SOLIMAR MAIDEN was discharged to home in good condition. Follow-up appointment with delivering provider in 1 weeks.***  Discharge Medications: Allergies as of 04/03/2023       Reactions   Abilify [aripiprazole] Other (See Comments)   Oculogyric crisis --eyes rolled in back of head   Ibuprofen Nausea And Vomiting   Only with higher strengths -- i.e., 600mg    Nuvigil [armodafinil] Swelling   Tongue and throat swelling     Med Rec must be completed prior to using this SMARTLINK***         Signed: *** Hit refresh and delete this line

## 2023-04-03 NOTE — Progress Notes (Signed)
Vigilanze flags hiv screen  Differentiation in process   5/21 hiv screen negative   Low risk per dr Jean Rosenthal    3 weeks is a rather short time for the antigen to become initially negative again with antibody seroconversion   Suspect pregnancy related falso positive test    However would give azt periop and a dose of biktarvy for patient   Check hiv rna  Further recs  dependent on hiv testing of the mother

## 2023-04-03 NOTE — Op Note (Signed)
Cesarean Section Operative Note    Patient Name: Natalie Welch  Date of Birth: December 02, 1990  MRN: [id number]  Date of Surgery: 04/03/2023   Pre-operative Diagnosis:  1) History of previous cesarean delivery, desires repeat 2) Gestational hypertension 3) Gestational diabetes, diet controlled 4) Desires permanent sterilization 5) intrauterine pregnancy at [redacted]w[redacted]d   Post-operative Diagnosis:  1) History of previous cesarean delivery, desires repeat 2) Gestational hypertension 3) Gestational diabetes, diet controlled 4) Desires permanent sterilization 5) intrauterine pregnancy at [redacted]w[redacted]d    Procedure:  1) Repeat cesarean delivery 2) Bilateral tubal ligation using Pomeroy method  Surgeon: Surgeon(s) and Role:    * Conard Novak, MD - Primary   Assistants: Margaretmary Eddy, CNM; No other capable assistant available, in surgery requiring high level assistant.  Anesthesia: spinal   Findings:  1) Uterus with ~3cm pedunculated fibroid, otherwise normal. Normal fallopian tubes, and ovaries 2) Viable female infant with weight 3,560 grams, APGARs 9 and 10   Quantified Blood Loss: 530 mL  Total IV Fluids: 500 ml   Urine Output:  50 mL  Specimens: portion of right and left fallopian tube  Complications: no complications  Disposition: PACU - hemodynamically stable.   Maternal Condition: stable   Baby condition / location:  Couplet care / Skin to Skin  Procedure Details:  The patient was seen in the Holding Room. The risks, benefits, complications, treatment options, and expected outcomes were discussed with the patient. The patient concurred with the proposed plan, giving informed consent. identified as Tana Conch and the procedure verified as C-Section Delivery. A Time Out was held and the above information confirmed.   After induction of anesthesia, the patient was draped and prepped in the usual sterile manner. A Pfannenstiel incision was made and carried down through the  subcutaneous tissue to the fascia. Fascial incision was made and extended transversely. The fascia was separated from the underlying rectus tissue superiorly and inferiorly. The peritoneum was identified and entered. Peritoneal incision was extended longitudinally. The bladder flap was not bluntly or sharply freed from the lower uterine segment. A low transverse uterine incision was made and the hysterotomy was extended with cranial-caudal tension. Delivered from cephalic presentation was a 3,560 gram Living newborn infant(s) or Female with Apgar scores of 9 at one minute and 10 at five minutes. A Kiwi flat vacuum was needed in order to affect delivery.  Cord ph was not sent the umbilical cord was clamped and cut cord blood was obtained for evaluation. The placenta was removed Intact and appeared normal. The uterine outline, tubes and ovaries appeared normal. The uterine incision was closed with running locked sutures of 0 Vicryl.  A second layer of the same suture was thrown in an imbricating fashion.  Hemostasis was assured.    The tubal ligation portion of the procedure was performed at this point.  The left fallopian tube was identified and followed out to the fimbriated end.  A Babcock clamp was used to grasp the tube in the mid-isthmic portion and two 2-0 plain gut sutures were used to ligate the tube.  An approximately 3cm segment of tube was removed with hemostasis assured.  The same procedure was performed on the right fallopian tube with hemostasis noted.   The uterus was returned to the abdomen and the paracolic gutters were cleared of all clots and debris.  The rectus muscles were inspected and found to be hemostatic.  The On-Q catheter pumps were inserted in accordance with the manufacturer's recommendations.  The catheters were inserted approximately 4cm cephelad to the incision line, approximately 1cm apart, straddling the midline.  They were inserted to a depth of the 4th mark. They were  positioned superficial to the rectus abdominus muscles and deep to the rectus fascia.    The fascia was then reapproximated with running sutures of 1-0 PDS, looped. The subcuticular closure was performed using 4-0 monocryl. The skin closure was reinforced using surgical skin glue.  The On-Q catheters were bolused with 5 mL of 0.5% marcaine plain for a total of 10 mL.  The catheters were affixed to the skin with surgical skin glue, steri-strips, and tegaderm.    The surgical assistant performed tissue retraction, assistance with suturing, and fundal pressure.  Instrument, sponge, and needle counts were correct prior the abdominal closure and were correct at the conclusion of the case.  The patient received Ancef 2 gram IV prior to skin incision (within 30 minutes). For VTE prophylaxis she was wearing SCDs throughout the case.  The assistant surgeon was an CNM due to lack of availability of another Sales promotion account executive.   Signed: Conard Novak, MD 04/03/2023 8:27 PM

## 2023-04-03 NOTE — Anesthesia Preprocedure Evaluation (Addendum)
Anesthesia Evaluation  Patient identified by MRN, date of birth, ID band Patient awake    Reviewed: Allergy & Precautions, H&P , NPO status , Patient's Chart, lab work & pertinent test results  Airway Mallampati: II  TM Distance: >3 FB Neck ROM: full    Dental no notable dental hx.    Pulmonary neg pulmonary ROS   Pulmonary exam normal        Cardiovascular Exercise Tolerance: Good hypertension, Normal cardiovascular exam  gHTN, palpitations (cleared by cards) s/p 5-day Holter   Neuro/Psych    GI/Hepatic negative GI ROS,,,  Endo/Other  diabetes (gestational diabetes)    Renal/GU   negative genitourinary   Musculoskeletal   Abdominal Normal abdominal exam  (+)   Peds  Hematology  (+) Blood dyscrasia, anemia   Anesthesia Other Findings Hypokalemia  Past Medical History: No date: Allergy No date: Anemia No date: Anxiety     Comment:  no meds No date: Complication of anesthesia No date: Depression No date: GERD (gastroesophageal reflux disease) No date: Gestational diabetes No date: PONV (postoperative nausea and vomiting)  Past Surgical History: 02/07/2020: CESAREAN SECTION; N/A     Comment:  Procedure: CESAREAN SECTION;  Surgeon: Conard Novak, MD;  Location: [facility] ORS;  Service: Obstetrics;                Laterality: N/A; 06/17/2021: CHOLECYSTECTOMY 12/30/2018: DILATION AND EVACUATION; N/A     Comment:  Procedure: DILATATION AND EVACUATION;  Surgeon:               Vena Austria, MD;  Location: [facility] ORS;  Service:               Gynecology;  Laterality: N/A; 06/17/2021: ENDOSCOPIC RETROGRADE CHOLANGIOPANCREATOGRAPHY (ERCP)  WITH PROPOFOL; N/A     Comment:  Procedure: ENDOSCOPIC RETROGRADE               CHOLANGIOPANCREATOGRAPHY (ERCP) WITH PROPOFOL;  Surgeon:               Iva Boop, MD;  Location: [facility] ENDOSCOPY;  Service:               Endoscopy;  Laterality: N/A; 06/17/2021:  REMOVAL OF STONES     Comment:  Procedure: REMOVAL OF STONES;  Surgeon: Iva Boop,              MD;  Location: [facility] ENDOSCOPY;  Service: Endoscopy;; 06/17/2021: SPHINCTEROTOMY     Comment:  Procedure: SPHINCTEROTOMY;  Surgeon: Iva Boop,               MD;  Location: [facility] ENDOSCOPY;  Service: Endoscopy;; No date: WISDOM TOOTH EXTRACTION  BMI    Body Mass Index: 33.62 kg/m      Reproductive/Obstetrics (+) Pregnancy                              Anesthesia Physical Anesthesia Plan  ASA: 2  Anesthesia Plan: Spinal   Post-op Pain Management:    Induction:   PONV Risk Score and Plan: Ondansetron  Airway Management Planned:   Additional Equipment:   Intra-op Plan:   Post-operative Plan:   Informed Consent: I have reviewed the patients History and Physical, chart, labs and discussed the procedure including the risks, benefits and alternatives for the proposed anesthesia with the patient or authorized representative who has indicated his/her  understanding and acceptance.     Dental Advisory Given  Plan Discussed with: Anesthesiologist and CRNA  Anesthesia Plan Comments:          Anesthesia Quick Evaluation

## 2023-04-03 NOTE — Anesthesia Procedure Notes (Addendum)
Spinal  Patient location during procedure: OB Start time: 04/03/2023 6:43 PM End time: 04/03/2023 6:53 PM Reason for block: surgical anesthesia Staffing Performed: anesthesiologist and resident/CRNA  Anesthesiologist: Corinda Gubler, MD Resident/CRNA: Katherine Basset, CRNA Performed by: Katherine Basset, CRNA Authorized by: Corinda Gubler, MD   Preanesthetic Checklist Completed: patient identified, IV checked, site marked, risks and benefits discussed, surgical consent, monitors and equipment checked, pre-op evaluation and timeout performed Spinal Block Patient position: sitting Prep: ChloraPrep Patient monitoring: heart rate, continuous pulse ox and blood pressure Approach: midline Location: L3-4 Injection technique: single-shot Needle Needle type: Pencan  Needle gauge: 25 G Needle length: 9 cm Assessment Sensory level: T4 Events: CSF return and second provider Additional Notes IV functioning, monitors applied to pt. Expiration date of kit checked and confirmed to be in date. Sterile prep and drape, hand hygiene and sterile gloved used. Pt was positioned and spine was prepped in sterile fashion. Skin was anesthetized with lidocaine. Free flow of clear CSF obtained prior to injecting local anesthetic into CSF x 2 attempt. Spinal needle aspirated freely following injection. Needle was carefully withdrawn, and pt tolerated procedure well. Loss of motor and sensory on exam post injection. 1st attempt by CRNA Chilton Si, 2nd attempt by Suzan Slick MD successful, Pt tolerated well

## 2023-04-03 NOTE — Discharge Instructions (Signed)
Cesarean Delivery, Care After Refer to this sheet in the next few weeks. These instructions provide you with information on caring for yourself after your procedure. Your health care provider may also give you specific instructions. Your treatment has been planned according to current medical practices, but problems sometimes occur. Call your health care provider if you have any problems or questions after you go home. HOME CARE INSTRUCTIONS  Please leave honey comb dressing (OP Site) on for 7 days.  You may shower during this period but turn your back to the water so that the dressing does not get directly saturated by the water.   You may take the dressing off on day 7.  The easiest way to do it is in the shower.  Allow the water to run over the dressing and it usually comes off easier.   Only take over-the-counter or prescription medications as directed by your health care provider. Do not drink alcohol, especially if you are breastfeeding or taking medication to relieve pain. Do not  smoke tobacco. Continue to use good perineal care. Good perineal care includes: Wiping your perineum from front to back. Keeping your perineum clean. Check your surgical cut (incision) daily for increased redness, drainage, swelling, or separation of skin. Shower and clean your incision gently with soap and water every day, by letting warm and soapy water run over the incision, and then pat it dry. If your health care provider says it is okay, leave the incision uncovered. Use a bandage (dressing) if the incision is draining fluid or appears irritated. If the adhesive strips across the incision do not fall off within 7 days, carefully peel them off, after a shower. Hug a pillow when coughing or sneezing until your incision is healed. This helps to relieve pain. Do not use tampons, douches or have sexual intercourse, until your health care provider says it is okay. Wear a well-fitting bra that provides breast  support. Limit wearing support panties or control-top hose. Drink enough fluids to keep your urine clear or pale yellow. Eat high-fiber foods such as whole grain cereals and breads, brown rice, beans, and fresh fruits and vegetables every day. These foods may help prevent or relieve constipation. Resume activities such as climbing stairs, driving, lifting, exercising, or traveling as directed by your health care provider. Try to have someone help you with your household activities and your newborn for at least a few days after you leave the hospital. Rest as much as possible. Try to rest or take a nap when your newborn is sleeping. Increase your activities gradually. Do not lift more than 15lbs until directed by a provider. Keep all of your scheduled postpartum appointments. It is very important to keep your scheduled follow-up appointments. At these appointments, your health care provider will be checking to make sure that you are healing physically and emotionally. SEEK MEDICAL CARE IF:  You are passing large clots from your vagina. Save any clots to show your health care provider. You have a foul smelling discharge from your vagina. You have trouble urinating. You are urinating frequently. You have pain when you urinate. You have a change in your bowel movements. You have increasing redness, pain, or swelling near your incision. You have pus draining from your incision. Your incision is separating. You have painful, hard, or reddened breasts. You have a severe headache. You have blurred vision or see spots. You feel sad or depressed. You have thoughts of hurting yourself or your newborn. You have questions   about your care, the care of your newborn, or medications. You are dizzy or light-headed. You have a rash. You have pain, redness, or swelling at the site of the removed intravenous access (IV) tube. You have nausea or vomiting. You stopped breastfeeding and have not had a  menstrual period within 12 weeks of stopping. You are not breastfeeding and have not had a menstrual period within 12 weeks of delivery. You have a fever. SEEK IMMEDIATE MEDICAL CARE IF: You have persistent pain. You have chest pain. You have shortness of breath. You faint. You have leg pain. You have stomach pain. Your vaginal bleeding saturates 2 or more sanitary pads in 1 hour. MAKE SURE YOU:  Understand these instructions. Will watch your condition. Will get help right away if you are not doing well or get worse. Document Released: 07/05/2002 Document Revised: 02/27/2014 Document Reviewed: 06/09/2012 Exit[employer] Patient Information 2015 [employer]. This information is not intended to replace advice given to you by your health care provider. Make sure you discuss any questions you have with your health care provider.  

## 2023-04-04 LAB — CBC
HCT: 26.5 % — ABNORMAL LOW (ref 36.0–46.0)
HCT: 18.8 % — ABNORMAL LOW (ref 36.0–46.0)
Hemoglobin: 8.6 g/dL — ABNORMAL LOW (ref 12.0–15.0)
Hemoglobin: 6.3 g/dL — ABNORMAL LOW (ref 12.0–15.0)
MCH: 28.3 pg (ref 26.0–34.0)
MCH: 29.3 pg (ref 26.0–34.0)
MCHC: 32.5 g/dL (ref 30.0–36.0)
MCHC: 33.5 g/dL (ref 30.0–36.0)
MCV: 87.2 fL (ref 80.0–100.0)
MCV: 87.4 fL (ref 80.0–100.0)
Platelets: 196 10*3/uL (ref 150–400)
Platelets: 144 10*3/uL — ABNORMAL LOW (ref 150–400)
RBC: 3.04 MIL/uL — ABNORMAL LOW (ref 3.87–5.11)
RBC: 2.15 MIL/uL — ABNORMAL LOW (ref 3.87–5.11)
RDW: 15.1 % (ref 11.5–15.5)
RDW: 15.5 % (ref 11.5–15.5)
WBC: 17.9 10*3/uL — ABNORMAL HIGH (ref 4.0–10.5)
WBC: 14.6 10*3/uL — ABNORMAL HIGH (ref 4.0–10.5)
nRBC: 0 % (ref 0.0–0.2)
nRBC: 0 % (ref 0.0–0.2)

## 2023-04-04 LAB — BPAM RBC: Blood Product Expiration Date: 202407142359

## 2023-04-04 LAB — TYPE AND SCREEN

## 2023-04-04 LAB — PREPARE RBC (CROSSMATCH)

## 2023-04-04 LAB — HIV-1 RNA QUANT-NO REFLEX-BLD
HIV 1 RNA Quant: <20 copies/mL
LOG10 HIV-1 RNA: UNDETERMINED log10copy/mL

## 2023-04-04 MED ORDER — WITCH HAZEL-GLYCERIN EX PADS: 1.0000 | MEDICATED_PAD | CUTANEOUS | Status: DC | PRN

## 2023-04-04 MED ORDER — NALBUPHINE HCL 10 MG/ML IJ SOLN: 10.0000 mg | Freq: Once | INTRAMUSCULAR | Status: DC

## 2023-04-04 MED ORDER — FENTANYL CITRATE PF 50 MCG/ML IJ SOSY
PREFILLED_SYRINGE | INTRAMUSCULAR | Status: AC
  Administered 2023-04-04: 100 ug
  Filled 2023-04-04: qty 1

## 2023-04-04 MED ORDER — FERROUS SULFATE 325 (65 FE) MG PO TABS
325.0000 mg | ORAL_TABLET | Freq: Two times a day (BID) | ORAL | Status: DC
  Administered 2023-04-04 – 2023-04-05 (×3): 325 mg via ORAL
  Filled 2023-04-04 (×5): qty 1

## 2023-04-04 MED ORDER — FENTANYL CITRATE PF 50 MCG/ML IJ SOSY
12.5000 ug | PREFILLED_SYRINGE | Freq: Once | INTRAMUSCULAR | Status: DC
  Filled 2023-04-04: qty 1

## 2023-04-04 MED ORDER — ACETAMINOPHEN 500 MG PO TABS
1000.0000 mg | ORAL_TABLET | Freq: Four times a day (QID) | ORAL | Status: DC
  Administered 2023-04-04 – 2023-04-06 (×6): 1000 mg via ORAL
  Filled 2023-04-04 (×6): qty 2

## 2023-04-04 MED ORDER — OXYCODONE HCL 5 MG PO TABS
5.0000 mg | ORAL_TABLET | ORAL | Status: DC | PRN
  Administered 2023-04-05: 5 mg via ORAL
  Administered 2023-04-05: 10 mg via ORAL
  Administered 2023-04-05: 5 mg via ORAL
  Administered 2023-04-05 – 2023-04-06 (×4): 10 mg via ORAL
  Filled 2023-04-04 (×4): qty 2
  Filled 2023-04-04 (×2): qty 1
  Filled 2023-04-04: qty 2

## 2023-04-04 MED ORDER — MISOPROSTOL 200 MCG PO TABS
800.0000 ug | ORAL_TABLET | Freq: Once | ORAL | Status: AC
  Administered 2023-04-04: 800 ug via RECTAL
  Filled 2023-04-04: qty 4

## 2023-04-04 MED ORDER — LACTATED RINGERS IV SOLN: INTRAVENOUS | Status: DC

## 2023-04-04 MED ORDER — FENTANYL CITRATE PF 50 MCG/ML IJ SOSY: 100.0000 ug | PREFILLED_SYRINGE | Freq: Once | INTRAMUSCULAR | Status: AC

## 2023-04-04 MED ORDER — COCONUT OIL OIL: 1.0000 | TOPICAL_OIL | Status: DC | PRN

## 2023-04-04 MED ORDER — BUPIVACAINE ON-Q PAIN PUMP (FOR ORDER SET NO CHG)
INJECTION | Status: DC
  Filled 2023-04-04: qty 1

## 2023-04-04 MED ORDER — DIPHENHYDRAMINE HCL 50 MG/ML IJ SOLN
25.0000 mg | Freq: Once | INTRAMUSCULAR | Status: AC
  Administered 2023-04-04: 25 mg via INTRAVENOUS
  Filled 2023-04-04: qty 1

## 2023-04-04 MED ORDER — SENNOSIDES-DOCUSATE SODIUM 8.6-50 MG PO TABS
2.0000 | ORAL_TABLET | ORAL | Status: DC
  Administered 2023-04-04 – 2023-04-06 (×2): 2 via ORAL
  Filled 2023-04-04 (×2): qty 2

## 2023-04-04 MED ORDER — AMMONIA AROMATIC IN INHA
RESPIRATORY_TRACT | Status: AC
  Filled 2023-04-04: qty 10

## 2023-04-04 MED ORDER — DIBUCAINE (PERIANAL) 1 % EX OINT: 1.0000 | TOPICAL_OINTMENT | CUTANEOUS | Status: DC | PRN

## 2023-04-04 MED ORDER — SODIUM CHLORIDE 0.9% IV SOLUTION: Freq: Once | INTRAVENOUS | Status: AC

## 2023-04-04 MED ORDER — IBUPROFEN 600 MG PO TABS
ORAL_TABLET | ORAL | Status: AC
  Filled 2023-04-04: qty 1

## 2023-04-04 MED ORDER — SIMETHICONE 80 MG PO CHEW
80.0000 mg | CHEWABLE_TABLET | Freq: Three times a day (TID) | ORAL | Status: DC
Start: 2023-04-04 — End: 2023-04-04

## 2023-04-04 MED ORDER — SIMETHICONE 80 MG PO CHEW
80.0000 mg | CHEWABLE_TABLET | Freq: Three times a day (TID) | ORAL | Status: DC
  Administered 2023-04-04 – 2023-04-06 (×7): 80 mg via ORAL
  Filled 2023-04-04 (×7): qty 1

## 2023-04-04 MED ORDER — PRENATAL MULTIVITAMIN CH
1.0000 | ORAL_TABLET | Freq: Every day | ORAL | Status: DC
  Administered 2023-04-05 – 2023-04-06 (×2): 1 via ORAL
  Filled 2023-04-04 (×2): qty 1

## 2023-04-04 MED ORDER — SIMETHICONE 80 MG PO CHEW: 80.0000 mg | CHEWABLE_TABLET | Freq: Three times a day (TID) | ORAL | Status: DC

## 2023-04-04 MED ORDER — MISOPROSTOL 200 MCG PO TABS
ORAL_TABLET | ORAL | Status: AC
  Filled 2023-04-04: qty 4

## 2023-04-04 MED ORDER — MENTHOL 3 MG MT LOZG: 1.0000 | LOZENGE | OROMUCOSAL | Status: DC | PRN

## 2023-04-04 MED ORDER — GABAPENTIN 100 MG PO CAPS
100.0000 mg | ORAL_CAPSULE | Freq: Every day | ORAL | Status: DC
  Administered 2023-04-04 – 2023-04-05 (×2): 100 mg via ORAL
  Filled 2023-04-04 (×2): qty 1

## 2023-04-04 MED ORDER — SODIUM CHLORIDE 0.9 % IV SOLN
300.0000 mg | Freq: Once | INTRAVENOUS | Status: AC
  Administered 2023-04-04: 300 mg via INTRAVENOUS
  Filled 2023-04-04: qty 300

## 2023-04-04 NOTE — Progress Notes (Signed)
Notified Felicia CNM of pt reaction to blood administration. See epic flowsheet for VS. Ordered to give 1000 mg tylenol now

## 2023-04-04 NOTE — Anesthesia Postprocedure Evaluation (Signed)
Anesthesia Post Note  Patient: Natalie Welch  Procedure(s) Performed: REPEAT CESAREAN SECTION WITH BILATERAL TUBAL LIGATION (Bilateral)  Patient location during evaluation: Mother Baby Anesthesia Type: Spinal Level of consciousness: oriented and awake and alert Pain management: pain level controlled Vital Signs Assessment: post-procedure vital signs reviewed and stable Respiratory status: spontaneous breathing and respiratory function stable Cardiovascular status: blood pressure returned to baseline and stable Postop Assessment: no headache, no backache, no apparent nausea or vomiting and able to ambulate Anesthetic complications: no   No notable events documented.   Last Vitals:  Vitals:   04/04/23 1721 04/04/23 1741  BP: 136/78 139/87  Pulse: 77 88  Resp: 18 20  Temp: 37.2 C 37.4 C  SpO2: 98% 99%    Last Pain:  Vitals:   04/04/23 1741  TempSrc: Oral  PainSc:                  Louie Boston

## 2023-04-04 NOTE — Progress Notes (Signed)
Id brief note Hiv rna is negative   While the hiv antibody confirmation test is in process, this hiv initial hiv screen is likely a false positive

## 2023-04-04 NOTE — Progress Notes (Signed)
Notified by RN patient was up to the bedside commode and passed a large blood clot about the size of a softball. I ordered a STAT CBC, her Hgb is now 6.3. I spoke with the patient about having a blood transfusion in which she agreed to. I ordered to units of RBC's. I notified Dr Jean Rosenthal of current postpartum events.  Chari Manning CNM

## 2023-04-04 NOTE — Progress Notes (Signed)
Called to patient room due to continued passing of large clots and dropping hemoglobin.  With female chaperone present both the CNM and I tried to perform a lower uterine segment sweep. Her lower uterine segment and cervix were soft and difficult to assess. Certainly unable to express any significant clots.  I did place misoprostol 800 mcg PR.  If BPs remain reasonable, may consider methergine next. If bleeding continues will need to go to OR for D&C to try to remove and potential retained POC from the lower uterine segment/uterus.   Her hemoglobin is now 6.3 and likely lower due to acute blood loss. Ordered 2 units pRBCs for now.  Continue to monitor very closely.  Thomasene Mohair, MD, [facility] 04/04/2023 6:37 PM

## 2023-04-04 NOTE — Progress Notes (Signed)
Postop Day  1  Subjective: tolerating PO, + flatus, and c/o itching  Doing well, no concerns. Ambulating without difficulty, pain managed with PO meds, tolerating regular diet, and voiding without difficulty.   No fever/chills, chest pain, shortness of breath, nausea/vomiting, or leg pain. No nipple or breast pain. No headache, visual changes, or RUQ/epigastric pain.  Objective: BP 124/76 (BP Location: Right Arm)   Pulse 81   Temp 98.8 F (37.1 C)   Resp 18   Ht 5\' 1"  (1.549 m)   Wt 80.7 kg   LMP  (LMP Unknown)   SpO2 98%   Breastfeeding Unknown   BMI 33.62 kg/m    Physical Exam:  General: alert, cooperative, and appears stated age Breasts: soft/nontender CV: RRR Pulm: nl effort, CTABL Abdomen: soft, non-tender, active bowel sounds Uterine Fundus: firm Incision: healing well, no significant drainage, no dehiscence, no significant erythema Perineum: minimal edema, intact Lochia: appropriate DVT Evaluation: No evidence of DVT seen on physical exam. Negative Homan's sign. No cords or calf tenderness. No significant calf/ankle edema.  Recent Labs    04/03/23 0738 04/04/23 0607  HGB 10.3* 8.6*  HCT 31.5* 26.5*  WBC 8.7 17.9*  PLT 184 196    Assessment/Plan: 32 y.o. [id number] postpartum day # 1. Patient up to the bathroom with RN at side. Patient sat on the toilet and proceeded to faint. Patient was safely transported to the bed with the assist of several RN's. VSS. Patient alert and able to verbalize events when CNM arrived to the room.  -Continue routine postpartum care -Encouraged snug fitting bra, cold application, Tylenol PRN, and cabbage leaves for engorgement for formula feeding  -Discussed contraceptive options including implant, IUDs hormonal and non-hormonal, injection, pills/ring/patch, condoms, and NFP.  -Acute blood loss anemia - hemodynamically stable and asymptomatic; start PO ferrous sulfate BID with stool softeners  - IV Venofer ordered -IV Fluid bolus  500 mls LR -Immunization status:   all immunizations up to date   Disposition: Continue inpatient postpartum care   LOS: 1 day   Bailee Metter LUCY Delmar Landau, CNM 04/04/2023, 10:28 AM   ----- Chari Manning Certified Nurse Midwife [facility]

## 2023-04-05 ENCOUNTER — Encounter: Payer: Self-pay | Admitting: Obstetrics and Gynecology

## 2023-04-05 LAB — BPAM RBC
Blood Product Expiration Date: 202407142359
ISSUE DATE / TIME: 202406081721
ISSUE DATE / TIME: 202406082215
Unit Type and Rh: 5100
Unit Type and Rh: 5100

## 2023-04-05 LAB — COMPREHENSIVE METABOLIC PANEL
ALT: 16 U/L (ref 0–44)
AST: 38 U/L (ref 15–41)
Albumin: 2.4 g/dL — ABNORMAL LOW (ref 3.5–5.0)
Alkaline Phosphatase: 65 U/L (ref 38–126)
Anion gap: 7 (ref 5–15)
BUN: 7 mg/dL (ref 6–20)
CO2: 24 mmol/L (ref 22–32)
Calcium: 8.1 mg/dL — ABNORMAL LOW (ref 8.9–10.3)
Chloride: 108 mmol/L (ref 98–111)
Creatinine, Ser: 0.85 mg/dL (ref 0.44–1.00)
GFR, Estimated: >60 mL/min (ref >60)
Glucose, Bld: 123 mg/dL — ABNORMAL HIGH (ref 70–99)
Potassium: 2.9 mmol/L — ABNORMAL LOW (ref 3.5–5.1)
Sodium: 139 mmol/L (ref 135–145)
Total Bilirubin: <0.1 mg/dL — ABNORMAL LOW (ref 0.3–1.2)
Total Protein: 4.8 g/dL — ABNORMAL LOW (ref 6.5–8.1)

## 2023-04-05 LAB — TYPE AND SCREEN
ABO/RH(D): O POS
Antibody Screen: NEGATIVE
Unit division: 0
Unit division: 0

## 2023-04-05 LAB — CBC WITH DIFFERENTIAL/PLATELET
Abs Immature Granulocytes: 0.11 10*3/uL — ABNORMAL HIGH (ref 0.00–0.07)
Basophils Absolute: 0 10*3/uL (ref 0.0–0.1)
Basophils Relative: 0 %
Eosinophils Absolute: 0.1 10*3/uL (ref 0.0–0.5)
Eosinophils Relative: 1 %
HCT: 23.5 % — ABNORMAL LOW (ref 36.0–46.0)
Hemoglobin: 7.8 g/dL — ABNORMAL LOW (ref 12.0–15.0)
Immature Granulocytes: 1 %
Lymphocytes Relative: 14 %
Lymphs Abs: 1.8 10*3/uL (ref 0.7–4.0)
MCH: 29 pg (ref 26.0–34.0)
MCHC: 33.2 g/dL (ref 30.0–36.0)
MCV: 87.4 fL (ref 80.0–100.0)
Monocytes Absolute: 0.7 10*3/uL (ref 0.1–1.0)
Monocytes Relative: 5 %
Neutro Abs: 9.6 10*3/uL — ABNORMAL HIGH (ref 1.7–7.7)
Neutrophils Relative %: 79 %
Platelets: 143 10*3/uL — ABNORMAL LOW (ref 150–400)
RBC: 2.69 MIL/uL — ABNORMAL LOW (ref 3.87–5.11)
RDW: 15.8 % — ABNORMAL HIGH (ref 11.5–15.5)
WBC: 12.3 10*3/uL — ABNORMAL HIGH (ref 4.0–10.5)
nRBC: 0 % (ref 0.0–0.2)

## 2023-04-05 LAB — HEMOGLOBIN AND HEMATOCRIT, BLOOD
HCT: 23.5 % — ABNORMAL LOW (ref 36.0–46.0)
Hemoglobin: 7.7 g/dL — ABNORMAL LOW (ref 12.0–15.0)

## 2023-04-05 MED ORDER — POTASSIUM CHLORIDE 10 MEQ/100ML IV SOLN
10.0000 meq | INTRAVENOUS | Status: AC
  Administered 2023-04-05 (×4): 10 meq via INTRAVENOUS
  Filled 2023-04-05 (×4): qty 100

## 2023-04-05 MED ORDER — NIFEDIPINE ER OSMOTIC RELEASE 30 MG PO TB24
30.0000 mg | ORAL_TABLET | Freq: Every day | ORAL | Status: DC
  Administered 2023-04-05: 30 mg via ORAL
  Filled 2023-04-05: qty 1

## 2023-04-05 MED ORDER — FUROSEMIDE 20 MG PO TABS
20.0000 mg | ORAL_TABLET | Freq: Every day | ORAL | Status: DC
  Filled 2023-04-05: qty 1

## 2023-04-05 NOTE — Progress Notes (Signed)
Notified by RN that patient had a reaction to 2nd unit of pRBC's. Infusion stopped, transfusion reaction labs drawn. Patient febrile, ordered 1000 mg of tylenol. Will order lasix 20 mg daily  Chari Manning CNM

## 2023-04-05 NOTE — Progress Notes (Addendum)
Postop Day  2  Subjective: no complaints, up ad lib, voiding, tolerating PO, and + flatus  Doing well, no concerns. Ambulating without difficulty, pain managed with PO meds, tolerating regular diet, and voiding without difficulty.   No fever/chills, chest pain, shortness of breath, nausea/vomiting, or leg pain. No nipple or breast pain. No headache, visual changes, or RUQ/epigastric pain.  Objective: BP (!) 145/87 (BP Location: Left Arm)   Pulse 88   Temp 98.5 F (36.9 C)   Resp 18   Ht 5\' 1"  (1.549 m)   Wt 80.7 kg   LMP  (LMP Unknown)   SpO2 98%   Breastfeeding Unknown   BMI 33.62 kg/m    Physical Exam:  General: alert, cooperative, and appears stated age Breasts: soft/nontender CV: RRR Pulm: nl effort, CTABL Abdomen: soft, non-tender, active bowel sounds Uterine Fundus: firm Incision: healing well, no significant drainage, no dehiscence, no significant erythema Perineum: minimal edema, intact Lochia: appropriate DVT Evaluation: No evidence of DVT seen on physical exam. Negative Homan's sign. No cords or calf tenderness. No significant calf/ankle edema.  Recent Labs    04/04/23 0607 04/04/23 1421 04/05/23 0405  HGB 8.6* 6.3* 7.7*  HCT 26.5* 18.8* 23.5*  WBC 17.9* 14.6*  --   PLT 196 144*  --     Assessment/Plan: 32 y.o. [id number] postpartum day # 2  -Continue routine postpartum care -Encouraged snug fitting bra, cold application, Tylenol PRN, and cabbage leaves for engorgement for formula feeding .  -Acute blood loss anemia - hemodynamically stable and asymptomatic; start PO ferrous sulfate BID with stool softeners  -Immunization status:   all immunizations up to date - IV potassium 40 meq x2 -start Procardia xl 30 mg daily  Disposition: Continue inpatient postpartum care patient Desires discharge home today pending labs   LOS: 2 days   Aimie Wagman Wonda Amis, CNM 04/05/2023, 9:06 AM   ----- Chari Manning Certified Nurse Midwife [place]  [facility]

## 2023-04-06 ENCOUNTER — Inpatient Hospital Stay: Admission: RE | Admit: 2023-04-06 | Payer: 59 | Source: Home / Self Care | Admitting: Obstetrics and Gynecology

## 2023-04-06 DIAGNOSIS — Z98891 History of uterine scar from previous surgery: Secondary | ICD-10-CM

## 2023-04-06 LAB — COMPREHENSIVE METABOLIC PANEL
ALT: 17 U/L (ref 0–44)
AST: 28 U/L (ref 15–41)
Albumin: 2.6 g/dL — ABNORMAL LOW (ref 3.5–5.0)
Alkaline Phosphatase: 72 U/L (ref 38–126)
Anion gap: 8 (ref 5–15)
BUN: 8 mg/dL (ref 6–20)
CO2: 26 mmol/L (ref 22–32)
Calcium: 8.7 mg/dL — ABNORMAL LOW (ref 8.9–10.3)
Chloride: 104 mmol/L (ref 98–111)
Creatinine, Ser: 0.84 mg/dL (ref 0.44–1.00)
GFR, Estimated: >60 mL/min (ref >60)
Glucose, Bld: 90 mg/dL (ref 70–99)
Potassium: 3.1 mmol/L — ABNORMAL LOW (ref 3.5–5.1)
Sodium: 138 mmol/L (ref 135–145)
Total Bilirubin: 0.3 mg/dL (ref 0.3–1.2)
Total Protein: 5.5 g/dL — ABNORMAL LOW (ref 6.5–8.1)

## 2023-04-06 LAB — CBC
HCT: 24.1 % — ABNORMAL LOW (ref 36.0–46.0)
Hemoglobin: 7.8 g/dL — ABNORMAL LOW (ref 12.0–15.0)
MCH: 28.8 pg (ref 26.0–34.0)
MCHC: 32.4 g/dL (ref 30.0–36.0)
MCV: 88.9 fL (ref 80.0–100.0)
Platelets: 181 10*3/uL (ref 150–400)
RBC: 2.71 MIL/uL — ABNORMAL LOW (ref 3.87–5.11)
RDW: 15.7 % — ABNORMAL HIGH (ref 11.5–15.5)
WBC: 11.3 10*3/uL — ABNORMAL HIGH (ref 4.0–10.5)
nRBC: 0 % (ref 0.0–0.2)

## 2023-04-06 MED ORDER — OXYCODONE HCL 5 MG PO TABS: 5.0000 mg | ORAL_TABLET | Freq: Four times a day (QID) | ORAL | 0 refills | Status: AC | PRN

## 2023-04-06 MED ORDER — ACETAMINOPHEN 500 MG PO TABS: 1000.0000 mg | ORAL_TABLET | Freq: Four times a day (QID) | ORAL | 0 refills | Status: DC | PRN

## 2023-04-06 MED ORDER — POTASSIUM CHLORIDE 20 MEQ PO PACK
40.0000 meq | PACK | Freq: Once | ORAL | Status: AC
  Administered 2023-04-06: 40 meq via ORAL
  Filled 2023-04-06: qty 2

## 2023-04-06 MED ORDER — NIFEDIPINE ER OSMOTIC RELEASE 30 MG PO TB24
60.0000 mg | ORAL_TABLET | Freq: Every day | ORAL | Status: DC
  Administered 2023-04-06: 60 mg via ORAL
  Filled 2023-04-06: qty 2

## 2023-04-06 MED ORDER — NIFEDIPINE ER 60 MG PO TB24: 60.0000 mg | ORAL_TABLET | Freq: Every day | ORAL | 3 refills | Status: DC

## 2023-04-06 MED ORDER — SIMETHICONE 80 MG PO CHEW: 80.0000 mg | CHEWABLE_TABLET | Freq: Four times a day (QID) | ORAL | 0 refills | Status: DC | PRN

## 2023-04-06 MED ORDER — POTASSIUM CHLORIDE 20 MEQ PO PACK: 40.0000 meq | PACK | Freq: Once | ORAL | 1 refills | Status: DC

## 2023-04-06 MED ORDER — SLOW RELEASE IRON 45 MG PO TBCR: 1.0000 | EXTENDED_RELEASE_TABLET | Freq: Two times a day (BID) | ORAL | 3 refills | Status: DC

## 2023-04-06 MED ORDER — SENNOSIDES-DOCUSATE SODIUM 8.6-50 MG PO TABS: 2.0000 | ORAL_TABLET | ORAL | 0 refills | Status: DC

## 2023-04-08 LAB — HIV-1/HIV-2 QUALITATIVE RNA
Final Interpretation: NEGATIVE
HIV-1 RNA, Qualitative: NONREACTIVE
HIV-2 RNA, Qualitative: NONREACTIVE

## 2023-04-08 LAB — HIV-1/2 AB - DIFFERENTIATION
HIV 1 Ab: NONREACTIVE
HIV 2 Ab: NONREACTIVE
Note: NEGATIVE

## 2023-04-09 LAB — SURGICAL PATHOLOGY

## 2023-04-23 LAB — TRANSFUSION REACTION
DAT C3: NEGATIVE
Post RXN DAT IgG: NEGATIVE

## 2023-04-29 ENCOUNTER — Telehealth: Payer: Self-pay

## 2023-04-29 NOTE — Telephone Encounter (Signed)

## 2023-05-11 ENCOUNTER — Encounter: Payer: Self-pay | Admitting: Nurse Practitioner

## 2023-05-11 ENCOUNTER — Ambulatory Visit: Payer: 59 | Admitting: Nurse Practitioner

## 2023-05-11 VITALS — BP 118/80 | HR 77 | Temp 98.6°F | Ht 61.0 in | Wt 155.6 lb

## 2023-05-11 DIAGNOSIS — Z8632 Personal history of gestational diabetes: Secondary | ICD-10-CM

## 2023-05-11 DIAGNOSIS — L989 Disorder of the skin and subcutaneous tissue, unspecified: Secondary | ICD-10-CM

## 2023-05-11 DIAGNOSIS — Z8759 Personal history of other complications of pregnancy, childbirth and the puerperium: Secondary | ICD-10-CM | POA: Diagnosis not present

## 2023-05-11 NOTE — Assessment & Plan Note (Signed)
Controlled with diet only during recent pregnancy. Will plan to check A1c in future to monitor.

## 2023-05-11 NOTE — Assessment & Plan Note (Addendum)
No longer on any medications. Blood pressure today stable at 118/80. Will continue to monitor.

## 2023-05-11 NOTE — Progress Notes (Signed)
Bethanie Dicker, NP-C Phone: [phone]  Natalie Welch is a 32 y.o. female who presents today to establish care and for skin lesion.   Patient with changing and worsening skin lesion on right hip. The area has been present for at least 6 months. It has gotten larger. She initially thought it was a bug bit or pimple. It is pruritic. It is not painful. She denies drainage or bleeding from it. It is hard with the center crusted over. She wears a band aid over it during the day. Denies fever or chills.   She recently had a baby boy on 04/03/2023 via c-section. Reports doing well postpartum. Denies problems with postpartum depression. She does endorse some anxiety. She did have gestational hypertension and diabetes during her pregnancy. Her diabetes was controlled with diet only. She is no longer on blood pressure medications. Blood pressure today was stable. She is scheduled to see her Ob-Gyn for her 6 week postpartum appointment next week.   Active Ambulatory Problems    Diagnosis Date Noted   Supervision of high risk pregnancy in third trimester 11/23/2018   Generalized anxiety disorder with panic attacks 08/05/2019   Anemia during pregnancy 11/16/2019   Polyhydramnios affecting pregnancy in third trimester 12/21/2019   Hyperlipidemia 09/28/2020   Previous cesarean section 02/07/2020   Skin lesion 05/11/2023   Hx gestational diabetes 05/11/2023   History of gestational hypertension 05/11/2023   Resolved Ambulatory Problems    Diagnosis Date Noted   Elevated glucose tolerance test 12/06/2019   Macrosomia affecting management of mother in third trimester 01/17/2020   Macrosomia affecting management of mother in third trimester, fetus 1 01/23/2020   Status post cesarean delivery 02/07/2020   Arrest of dilation, delivered, current hospitalization 02/07/2020   Gestational hypertension 02/07/2020   Acute calculous cholecystitis 06/10/2021   Cholelithiasis with biliary obstruction 06/15/2021    Abdominal pain 06/16/2021   Elevated LFTs 06/16/2021   Choledocholithiasis    Biliary obstruction    Trauma during pregnancy 03/08/2023   Headache in pregnancy, antepartum, third trimester 04/01/2023   History of cesarean delivery 04/03/2023   Diet controlled gestational diabetes mellitus (GDM) in third trimester 02/04/2023   Past Medical History:  Diagnosis Date   Allergy    Anemia    Anxiety    Complication of anesthesia    Depression    GERD (gastroesophageal reflux disease)    Gestational diabetes    PONV (postoperative nausea and vomiting)     Family History  Problem Relation Age of Onset   Hypertension Mother    Healthy Father    Diabetes Paternal Grandmother     Social History   Socioeconomic History   Marital status: Married    Spouse name: Harrold Donath   Number of children: Not on file   Years of education: Not on file   Highest education level: Not on file  Occupational History   Not on file  Tobacco Use   Smoking status: Never   Smokeless tobacco: Never  Vaping Use   Vaping status: Never Used  Substance and Sexual Activity   Alcohol use: Not Currently    Alcohol/week: 7.0 standard drinks of alcohol   Drug use: Never   Sexual activity: Yes    Birth control/protection: Surgical, None  Other Topics Concern   Not on file  Social History Narrative   Not on file   Social Determinants of Health   Financial Resource Strain: Not on file  Food Insecurity: No Food Insecurity (04/03/2023)  Hunger Vital Sign    Worried About Running Out of Food in the Last Year: Never true    Ran Out of Food in the Last Year: Never true  Transportation Needs: No Transportation Needs (04/03/2023)   PRAPARE - Administrator, Civil Service (Medical): No    Lack of Transportation (Non-Medical): No  Physical Activity: Not on file  Stress: Not on file  Social Connections: Not on file  Intimate Partner Violence: Not on file    ROS  General:  Negative for  unexplained weight loss, fever Skin: Negative for rash HEENT: Negative for trouble hearing, trouble seeing, ringing in ears, mouth sores, hoarseness, change in voice, dysphagia. CV:  Negative for chest pain, dyspnea, edema, palpitations Resp: Negative for cough, dyspnea, hemoptysis GI: Negative for nausea, vomiting, diarrhea, constipation, abdominal pain, melena, hematochezia. GU: Negative for dysuria, incontinence, urinary hesitance, hematuria, vaginal or penile discharge, polyuria, sexual difficulty, lumps in testicle or breasts MSK: Negative for muscle cramps or aches, joint pain or swelling Neuro: Negative for headaches, weakness, numbness, dizziness, passing out/fainting Psych: Negative for depression, memory problems  Objective  Physical Exam Vitals:   05/11/23 0948  BP: 118/80  Pulse: 77  Temp: 98.6 F (37 C)  SpO2: 99%    BP Readings from Last 3 Encounters:  05/11/23 118/80  04/06/23 (!) 140/89  04/01/23 134/77   Wt Readings from Last 3 Encounters:  05/11/23 155 lb 9.6 oz (70.6 kg)  04/02/23 177 lb 14.6 oz (80.7 kg)  04/01/23 178 lb (80.7 kg)    Physical Exam Constitutional:      General: She is not in acute distress.    Appearance: Normal appearance.  HENT:     Head: Normocephalic.  Cardiovascular:     Rate and Rhythm: Normal rate and regular rhythm.     Heart sounds: Normal heart sounds.  Pulmonary:     Effort: Pulmonary effort is normal.     Breath sounds: Normal breath sounds.  Skin:    General: Skin is warm and dry.     Findings: Lesion (right hip- see picture below) present.  Neurological:     General: No focal deficit present.     Mental Status: She is alert.  Psychiatric:        Mood and Affect: Mood normal.        Behavior: Behavior normal.      Assessment/Plan:   Skin lesion Assessment & Plan: Worsening. Present for at least 6 months. Will refer to Dermatology for further evaluation and removal.   Orders: -     Ambulatory referral to  Dermatology  Hx gestational diabetes Assessment & Plan: Controlled with diet only during recent pregnancy. Will plan to check A1c in future to monitor.    History of gestational hypertension Assessment & Plan: No longer on any medications. Blood pressure today stable at 118/80. Will continue to monitor.     Return for Annual Exam, sooner PRN.   Bethanie Dicker, NP-C [facility] - [employer]

## 2023-05-11 NOTE — Assessment & Plan Note (Signed)
Worsening. Present for at least 6 months. Will refer to Dermatology for further evaluation and removal.

## 2023-05-19 ENCOUNTER — Ambulatory Visit: Payer: No Typology Code available for payment source | Admitting: Dermatology

## 2023-05-19 VITALS — BP 118/80

## 2023-05-19 DIAGNOSIS — L720 Epidermal cyst: Secondary | ICD-10-CM

## 2023-05-19 NOTE — Progress Notes (Signed)
   New Patient Visit   Subjective  Natalie Welch is a 32 y.o. female who presents for the following: bump at right hip present for about 6 months. She thought it was a bug bite to begin with and tried to pop it and had a little drainage at that time that was dark. Since then it has gotten larger, is tender to touch and itches occasionally.   The following portions of the chart were reviewed this encounter and updated as appropriate: medications, allergies, medical history  Review of Systems:  No other skin or systemic complaints except as noted in HPI or Assessment and Plan.  Objective  Well appearing patient in no apparent distress; mood and affect are within normal limits.   A focused examination was performed of the following areas: Right hip  Relevant exam findings are noted in the Assessment and Plan.   Assessment & Plan   EPIDERMAL INCLUSION CYST/dilated pore of winer vs pilomatricoma vs warty dyskeratoma vs verruca Exam: Subcutaneous nodule with central ulceration and keratotic debris at right hip 14 x 10 mm  Benign-appearing. Discussed that a cyst is a benign growth that can grow over time and sometimes get irritated or inflamed. Recommend observation if it is not bothersome. Size of lesion and extension of lesion in subcutaneous space warrants excision as opposed to shave or punch biopsy. Discussed option of surgical excision to remove it if it is growing, symptomatic, or other changes noted. Please call for new or changing lesions so they can be evaluated. Jointly decided to excise lesion.  Patient not on any blood thinners.    Return for Surgery for cyst at right hip.  Anise Salvo, RMA, am acting as scribe for Elie Goody, MD .   Documentation: I have reviewed the above documentation for accuracy and completeness, and I agree with the above.  Elie Goody, MD

## 2023-05-19 NOTE — Patient Instructions (Addendum)
Pre-Operative Instructions  You are scheduled for a surgical procedure at [facility]. We recommend you read the following instructions. If you have any questions or concerns, please call the office at [phone].  Shower and wash the entire body with soap and water the day of your surgery paying special attention to cleansing at and around the planned surgery site.  Avoid aspirin or aspirin containing products at least fourteen (14) days prior to your surgical procedure and for at least one week (7 Days) after your surgical procedure. If you take aspirin on a regular basis for heart disease or history of stroke or for any other reason, we may recommend you continue taking aspirin but please notify us if you take this on a regular basis. Aspirin can cause more bleeding to occur during surgery as well as prolonged bleeding and bruising after surgery.   Avoid other nonsteroidal pain medications at least one week prior to surgery and at least one week prior to your surgery. These include medications such as Ibuprofen (Motrin, Advil and Nuprin), Naprosyn, Voltaren, Relafen, etc. If medications are used for therapeutic reasons, please inform us as they can cause increased bleeding or prolonged bleeding during and bruising after surgical procedures.   Please advise [place] if you are taking any "blood thinner" medications such as Coumadin or Dipyridamole or Plavix or similar medications. These cause increased bleeding and prolonged bleeding during procedures and bruising after surgical procedures. We may have to consider discontinuing these medications briefly prior to and shortly after your surgery if safe to do so.   Please inform us of all medications you are currently taking. All medications that are taken regularly should be taken the day of surgery as you always do. Nevertheless, we need to be informed of what medications you are taking prior to surgery to know whether they will affect the  procedure or cause any complications.   Please inform us of any medication allergies. Also inform us of whether you have allergies to Latex or rubber products or whether you have had any adverse reaction to Lidocaine or Epinephrine.  Please inform us of any prosthetic or artificial body parts such as artificial heart valve, joint replacements, etc., or similar condition that might require preoperative antibiotics.   We recommend avoidance of alcohol at least two weeks prior to surgery and continued avoidance for at least two weeks after surgery.   We recommend discontinuation of tobacco smoking at least two weeks prior to surgery and continued abstinence for at least two weeks after surgery.  Do not plan strenuous exercise, strenuous work or strenuous lifting for approximately four weeks after your surgery.   We request if you are unable to make your scheduled surgical appointment, please call us at least a week in advance or as soon as you are aware of a problem so that we can cancel or reschedule the appointment.   You MAY TAKE TYLENOL (acetaminophen) for pain as it is not a blood thinner.   PLEASE PLAN TO BE IN TOWN FOR TWO WEEKS FOLLOWING SURGERY, THIS IS IMPORTANT SO YOU CAN BE CHECKED FOR DRESSING CHANGES, SUTURE REMOVAL AND TO MONITOR FOR POSSIBLE COMPLICATIONS.  Due to recent changes in healthcare laws, you may see results of your pathology and/or laboratory studies on MyChart before the doctors have had a chance to review them. We understand that in some cases there may be results that are confusing or concerning to you. Please understand that not all results are received at  the same time and often the doctors may need to interpret multiple results in order to provide you with the best plan of care or course of treatment. Therefore, we ask that you please give [place] 2 business days to thoroughly review all your results before contacting the office for clarification. Should we see a critical  lab result, you will be contacted sooner.   If You Need Anything After Your Visit  If you have any questions or concerns for your doctor, please call our main line at [phone] and press option 4 to reach your doctor's medical assistant. If no one answers, please leave a voicemail as directed and we will return your call as soon as possible. Messages left after 4 pm will be answered the following business day.   You may also send [place] a message via MyChart. We typically respond to MyChart messages within 1-2 business days.  For prescription refills, please ask your pharmacy to contact our office. Our fax number is [phone].  If you have an urgent issue when the clinic is closed that cannot wait until the next business day, you can page your doctor at the number below.    Please note that while we do our best to be available for urgent issues outside of office hours, we are not available 24/7.   If you have an urgent issue and are unable to reach [place], you may choose to seek medical care at your doctor's office, retail clinic, urgent care center, or emergency room.  If you have a medical emergency, please immediately call 911 or go to the emergency department.  Pager Numbers  - Dr. Gwen Pounds: [phone]  - Dr. Roseanne Reno: [phone]  In the event of inclement weather, please call our main line at [phone] for an update on the status of any delays or closures.  Dermatology Medication Tips: Please keep the boxes that topical medications come in in order to help keep track of the instructions about where and how to use these. Pharmacies typically print the medication instructions only on the boxes and not directly on the medication tubes.   If your medication is too expensive, please contact our office at [phone] option 4 or send [place] a message through MyChart.   We are unable to tell what your co-pay for medications will be in advance as this is different depending on your  insurance coverage. However, we may be able to find a substitute medication at lower cost or fill out paperwork to get insurance to cover a needed medication.   If a prior authorization is required to get your medication covered by your insurance company, please allow [place] 1-2 business days to complete this process.  Drug prices often vary depending on where the prescription is filled and some pharmacies may offer cheaper prices.  The website www.goodrx.com contains coupons for medications through different pharmacies. The prices here do not account for what the cost may be with help from insurance (it may be cheaper with your insurance), but the website can give you the price if you did not use any insurance.  - You can print the associated coupon and take it with your prescription to the pharmacy.  - You may also stop by our office during regular business hours and pick up a GoodRx coupon card.  - If you need your prescription sent electronically to a different pharmacy, notify our office through [facility] or by phone at [phone] option 4.

## 2023-06-24 ENCOUNTER — Ambulatory Visit: Payer: No Typology Code available for payment source | Admitting: Dermatology

## 2023-06-24 ENCOUNTER — Encounter: Payer: Self-pay | Admitting: Dermatology

## 2023-06-24 VITALS — BP 124/84 | HR 81

## 2023-06-24 DIAGNOSIS — D485 Neoplasm of uncertain behavior of skin: Secondary | ICD-10-CM

## 2023-06-24 DIAGNOSIS — L723 Sebaceous cyst: Secondary | ICD-10-CM | POA: Diagnosis not present

## 2023-06-24 MED ORDER — MUPIROCIN 2 % EX OINT: TOPICAL_OINTMENT | CUTANEOUS | 1 refills | Status: DC

## 2023-06-24 NOTE — Patient Instructions (Addendum)

## 2023-06-24 NOTE — Progress Notes (Addendum)
   Follow-Up Visit   Subjective  Natalie Welch is a 32 y.o. female who presents for the following: Excision of dilated pore of winer vs pilomatricoma vs warty dyskeratoma vs verruca at right hip  The following portions of the chart were reviewed this encounter and updated as appropriate: medications, allergies, medical history  Review of Systems:  No other skin or systemic complaints except as noted in HPI or Assessment and Plan.  Objective  Well appearing patient in no apparent distress; mood and affect are within normal limits.  A focused examination was performed of the following areas: Right hip Relevant physical exam findings are noted in the Assessment and Plan.   Right Hip Subcutaneous nodule with central ulceration and keratotic debris at right hip 14 x 10 mm  dilated pore of winer vs pilomatricoma vs warty dyskeratoma vs verruca     Assessment & Plan   Neoplasm of uncertain behavior of skin Right Hip  Skin excision  Lesion length (cm):  1.7 Lesion width (cm):  1.7 Total excision diameter (cm):  1.7 Informed consent: discussed and consent obtained   Timeout: patient name, date of birth, surgical site, and procedure verified   Procedure prep:  Patient was prepped and draped in usual sterile fashion Prep type:  Isopropyl alcohol and povidone-iodine Anesthesia: the lesion was anesthetized in a standard fashion   Anesthetic:  1% lidocaine w/ epinephrine 1-100,000 buffered w/ 8.4% NaHCO3 (6 cc lido w/epi, 3 cc bupivicaine) Instrument used: #15 blade   Hemostasis achieved with: pressure and electrodesiccation   Outcome: patient tolerated procedure well with no complications   Post-procedure details: sterile dressing applied and wound care instructions given   Dressing type: bandage and pressure dressing (mupirocin)    Skin repair Complexity:  Intermediate Final length (cm):  4.4 Informed consent: discussed and consent obtained   Timeout: patient name, date of  birth, surgical site, and procedure verified   Procedure prep:  Patient was prepped and draped in usual sterile fashion Prep type:  Povidone-iodine Anesthesia: the lesion was anesthetized in a standard fashion   Anesthetic:  1% lidocaine w/ epinephrine 1-100,000 local infiltration Reason for type of repair: reduce tension to allow closure, reduce the risk of dehiscence, infection, and necrosis, allow closure of the large defect, preserve normal anatomy and preserve normal anatomical and functional relationships   Undermining: edges could be approximated without difficulty   Subcutaneous layers (deep stitches):  Suture size:  3-0 Suture type: PDS (polydioxanone)   Stitches:  Buried vertical mattress Fine/surface layer approximation (top stitches):  Suture size:  4-0 Suture type: Prolene (polypropylene)   Stitches: simple running   Suture removal (days):  7 Hemostasis achieved with: suture, pressure and electrodesiccation Outcome: patient tolerated procedure well with no complications   Post-procedure details: wound care instructions given   Additional details:  Mupirocin and a pressure dressing applied  Specimen 1 - Surgical pathology Differential Diagnosis: dilated pore of winer vs pilomatricoma vs warty dyskeratoma vs verruca  Check Margins: No Subcutaneous nodule with central ulceration and keratotic debris at right hip 14 x 10 mm       Return in about 1 week (around 07/01/2023) for Suture Removal, with Dr. Katrinka Blazing.  Anise Salvo, RMA, am acting as scribe for Elie Goody, MD .   Documentation: I have reviewed the above documentation for accuracy and completeness, and I agree with the above.  Elie Goody, MD

## 2023-06-26 ENCOUNTER — Telehealth: Payer: Self-pay

## 2023-06-26 NOTE — Telephone Encounter (Signed)
Called patient, doing fine s/p surgery.  Butch Penny., RMA

## 2023-07-02 ENCOUNTER — Encounter: Payer: Self-pay | Admitting: Dermatology

## 2023-07-02 ENCOUNTER — Ambulatory Visit: Payer: No Typology Code available for payment source | Admitting: Dermatology

## 2023-07-02 DIAGNOSIS — Z4802 Encounter for removal of sutures: Secondary | ICD-10-CM

## 2023-07-02 NOTE — Patient Instructions (Signed)

## 2023-07-02 NOTE — Progress Notes (Signed)
   Follow-Up Visit   Subjective  Natalie Welch is a 32 y.o. female who presents for the following: Suture removal  Pathology showed verrucous cyst  The following portions of the chart were reviewed this encounter and updated as appropriate: medications, allergies, medical history  Review of Systems:  No other skin or systemic complaints except as noted in HPI or Assessment and Plan.  Objective  Well appearing patient in no apparent distress; mood and affect are within normal limits.  Areas Examined: Right hip Relevant physical exam findings are noted in the Assessment and Plan.    Assessment & Plan    Encounter for Removal of Sutures - Incision site is clean, dry and intact. - Wound cleansed, sutures removed, wound cleansed and steri strips applied.  - Discussed pathology results showing verrucous cyst - Patient advised to keep steri-strips dry until they fall off. - Scars remodel for a full year. - Once steri-strips fall off, patient can apply over-the-counter silicone scar cream once to twice a day to help with scar remodeling if desired. - Patient advised to call with any concerns or if they notice any new or changing lesions.  Return if symptoms worsen or fail to improve.  Anise Salvo, RMA, am acting as scribe for Elie Goody, MD .   Documentation: I have reviewed the above documentation for accuracy and completeness, and I agree with the above.  Elie Goody, MD

## 2023-07-29 NOTE — Progress Notes (Unsigned)
Bethanie Dicker, NP-C Phone: [phone]  Natalie Welch is a 32 y.o. female who presents today for annual exam.   She had a baby boy in June. She has recently started struggling with postpartum depression and anxiety. She was started on Lexapro last week by her Ob-Gyn. She has a long history of anxiety and depression and has been on medications in the past. She used to see a therapist. She has recently been having an increase in panic attacks as well as intrusive thoughts. She does not have thoughts of hurting herself or anyone else but does have thoughts about dying. She is getting ready to start back to work. She is very emotional and irritable. She has been looking into finding a therapist.   Diet: Trying to improve- snacking a lot, eats mostly quick, grab and go foods Exercise: Walking 2.5 miles 2 days per week Pap smear: March 2023- seeing Ob-Gyn Family history-  Colon cancer: No  Breast cancer: No  Ovarian cancer: No Menses: LMP- 07/05/2023- regular cycles, heavier the first 2 days since having a baby in June Sexually active: Yes Vaccines-   Flu: Declined  Tetanus: 12/20/2019  COVID19: Never HIV screening: Negative Hep C Screening: Negative Tobacco use: No Alcohol use: Yes, once a month Illicit Drug use: No Dentist: Yes Ophthalmology: Yes   Social History   Tobacco Use  Smoking Status Never  Smokeless Tobacco Never    Current Outpatient Medications on File Prior to Visit  Medication Sig Dispense Refill   escitalopram (LEXAPRO) 10 MG tablet Take 10 mg by mouth.     No current facility-administered medications on file prior to visit.    ROS see history of present illness  Objective  Physical Exam Vitals:   07/30/23 0841  BP: 122/78  Pulse: 76  Temp: 98.6 F (37 C)  SpO2: 98%    BP Readings from Last 3 Encounters:  07/30/23 122/78  06/24/23 124/84  05/19/23 118/80   Wt Readings from Last 3 Encounters:  07/30/23 154 lb (69.9 kg)  05/11/23 155 lb 9.6  oz (70.6 kg)  04/02/23 177 lb 14.6 oz (80.7 kg)    Physical Exam Constitutional:      General: She is not in acute distress.    Appearance: Normal appearance.  HENT:     Head: Normocephalic.     Right Ear: Tympanic membrane normal.     Left Ear: Tympanic membrane normal.     Nose: Nose normal.     Mouth/Throat:     Mouth: Mucous membranes are moist.     Pharynx: Oropharynx is clear.  Eyes:     Conjunctiva/sclera: Conjunctivae normal.     Pupils: Pupils are equal, round, and reactive to light.  Neck:     Thyroid: No thyromegaly.  Cardiovascular:     Rate and Rhythm: Normal rate and regular rhythm.     Heart sounds: Normal heart sounds.  Pulmonary:     Effort: Pulmonary effort is normal.     Breath sounds: Normal breath sounds.  Abdominal:     General: Abdomen is flat. Bowel sounds are normal.     Palpations: Abdomen is soft. There is no mass.     Tenderness: There is no abdominal tenderness.  Musculoskeletal:        General: Normal range of motion.  Lymphadenopathy:     Cervical: No cervical adenopathy.  Skin:    General: Skin is warm and dry.     Findings: No rash.  Neurological:  General: No focal deficit present.     Mental Status: She is alert.  Psychiatric:        Mood and Affect: Mood is anxious. Affect is tearful.        Behavior: Behavior normal.    Assessment/Plan: Please see individual problem list.  Encounter for well adult exam with abnormal findings Assessment & Plan: Physical exam complete. Lab work as outlined. Will contact patient with results. Pap- UTD. Declined flu vaccine. Tetanus vaccine- UTD. Declined all COVID vaccines. HIV screening- negative. Hep C screening- negative. Recommended follow ups with Dentist and Ophthalmology for annual exams. Encouraged healthy diet and exercise. Return to care in 4 weeks, sooner PRN.   Orders: -     CBC with Differential/Platelet -     Comprehensive metabolic panel  Generalized anxiety disorder with  panic attacks Assessment & Plan: Chronic issue. Worsening symptoms since having a baby in June. GAD- 14 today. Will start patient on Buspar 7.5 mg BID. She will continue Lexapro daily. Counseled on common side effects. She will follow up in 4 weeks, sooner PRN. Will continue to monitor.   Orders: -     VITAMIN D 25 Hydroxy (Vit-D Deficiency, Fractures) -     busPIRone HCl; Take 1 tablet (7.5 mg total) by mouth 2 (two) times daily.  Dispense: 180 tablet; Refill: 0  Postpartum depression Assessment & Plan: Patient started on Lexapro last week. Discussed with patient that this medication can take several weeks before seeing full effects. Encouraged to continue taking daily. Counseled patient on common side effects. Encouraged to contact if worsening symptoms, unusual behavior changes or suicidal thoughts occur. PHQ- 15 today. She endorses safety. She will follow up in 4 weeks, sooner PRN. Will continue to monitor.    Hyperlipidemia, unspecified hyperlipidemia type Assessment & Plan: Chronic. Stable with diet control. Will check lipid panel today. Encouraged healthy diet and exercise.   Orders: -     Lipid panel  Hx gestational diabetes Assessment & Plan: Controlled with diet only during recent pregnancy. Will check A1c today. Encouraged healthy diet and exercise.    Orders: -     Hemoglobin A1c  Anemia during pregnancy Assessment & Plan: No longer on iron supplement. Will check iron panel today to monitor.   Orders: -     IBC + Ferritin  Thyroid disorder screen -     TSH   Return in about 4 weeks (around 08/27/2023) for Anxiety/Depression.   Bethanie Dicker, NP-C [facility] - [employer]

## 2023-07-30 ENCOUNTER — Ambulatory Visit: Payer: No Typology Code available for payment source | Admitting: Nurse Practitioner

## 2023-07-30 ENCOUNTER — Encounter: Payer: Self-pay | Admitting: Nurse Practitioner

## 2023-07-30 VITALS — BP 122/78 | HR 76 | Temp 98.6°F | Ht 61.0 in | Wt 154.0 lb

## 2023-07-30 DIAGNOSIS — F411 Generalized anxiety disorder: Secondary | ICD-10-CM | POA: Diagnosis not present

## 2023-07-30 DIAGNOSIS — Z8632 Personal history of gestational diabetes: Secondary | ICD-10-CM | POA: Diagnosis not present

## 2023-07-30 DIAGNOSIS — F53 Postpartum depression: Secondary | ICD-10-CM | POA: Insufficient documentation

## 2023-07-30 DIAGNOSIS — F410 Panic disorder [episodic paroxysmal anxiety] without agoraphobia: Secondary | ICD-10-CM | POA: Diagnosis not present

## 2023-07-30 DIAGNOSIS — Z0001 Encounter for general adult medical examination with abnormal findings: Secondary | ICD-10-CM | POA: Diagnosis not present

## 2023-07-30 DIAGNOSIS — E785 Hyperlipidemia, unspecified: Secondary | ICD-10-CM

## 2023-07-30 DIAGNOSIS — Z1329 Encounter for screening for other suspected endocrine disorder: Secondary | ICD-10-CM

## 2023-07-30 DIAGNOSIS — O99019 Anemia complicating pregnancy, unspecified trimester: Secondary | ICD-10-CM

## 2023-07-30 DIAGNOSIS — F32A Depression, unspecified: Secondary | ICD-10-CM | POA: Insufficient documentation

## 2023-07-30 DIAGNOSIS — Z862 Personal history of diseases of the blood and blood-forming organs and certain disorders involving the immune mechanism: Secondary | ICD-10-CM | POA: Diagnosis not present

## 2023-07-30 HISTORY — DX: Encounter for general adult medical examination with abnormal findings: Z00.01

## 2023-07-30 LAB — COMPREHENSIVE METABOLIC PANEL
ALT: 40 U/L — ABNORMAL HIGH (ref 0–35)
AST: 26 U/L (ref 0–37)
Albumin: 4.5 g/dL (ref 3.5–5.2)
Alkaline Phosphatase: 72 U/L (ref 39–117)
BUN: 9 mg/dL (ref 6–23)
CO2: 28 meq/L (ref 19–32)
Calcium: 9.6 mg/dL (ref 8.4–10.5)
Chloride: 104 meq/L (ref 96–112)
Creatinine, Ser: 0.85 mg/dL (ref 0.40–1.20)
GFR: 91.47 mL/min (ref >60.00)
Glucose, Bld: 80 mg/dL (ref 70–99)
Potassium: 4.3 meq/L (ref 3.5–5.1)
Sodium: 136 meq/L (ref 135–145)
Total Bilirubin: 0.2 mg/dL (ref 0.2–1.2)
Total Protein: 7.8 g/dL (ref 6.0–8.3)

## 2023-07-30 LAB — HEMOGLOBIN A1C: Hgb A1c MFr Bld: 6 % (ref 4.6–6.5)

## 2023-07-30 LAB — CBC WITH DIFFERENTIAL/PLATELET
Basophils Absolute: 0 10*3/uL (ref 0.0–0.1)
Basophils Relative: 0.2 % (ref 0.0–3.0)
Eosinophils Absolute: 0.2 10*3/uL (ref 0.0–0.7)
Eosinophils Relative: 1.8 % (ref 0.0–5.0)
HCT: 40.3 % (ref 36.0–46.0)
Hemoglobin: 12.9 g/dL (ref 12.0–15.0)
Lymphocytes Relative: 22.7 % (ref 12.0–46.0)
Lymphs Abs: 1.9 10*3/uL (ref 0.7–4.0)
MCHC: 32 g/dL (ref 30.0–36.0)
MCV: 84.9 fL (ref 78.0–100.0)
Monocytes Absolute: 0.8 10*3/uL (ref 0.1–1.0)
Monocytes Relative: 8.9 % (ref 3.0–12.0)
Neutro Abs: 5.7 10*3/uL (ref 1.4–7.7)
Neutrophils Relative %: 66.4 % (ref 43.0–77.0)
Platelets: 306 10*3/uL (ref 150.0–400.0)
RBC: 4.74 Mil/uL (ref 3.87–5.11)
RDW: 13.9 % (ref 11.5–15.5)
WBC: 8.6 10*3/uL (ref 4.0–10.5)

## 2023-07-30 LAB — LIPID PANEL
Cholesterol: 177 mg/dL (ref 0–200)
HDL: 42.5 mg/dL (ref >39.00)
LDL Cholesterol: 95 mg/dL (ref 0–99)
NonHDL: 134.59
Total CHOL/HDL Ratio: 4
Triglycerides: 198 mg/dL — ABNORMAL HIGH (ref 0.0–149.0)
VLDL: 39.6 mg/dL (ref 0.0–40.0)

## 2023-07-30 LAB — IBC + FERRITIN
Ferritin: 13.8 ng/mL (ref 10.0–291.0)
Iron: 59 ug/dL (ref 42–145)
Saturation Ratios: 13.7 % — ABNORMAL LOW (ref 20.0–50.0)
TIBC: 429.8 ug/dL (ref 250.0–450.0)
Transferrin: 307 mg/dL (ref 212.0–360.0)

## 2023-07-30 LAB — VITAMIN D 25 HYDROXY (VIT D DEFICIENCY, FRACTURES): VITD: 45.89 ng/mL (ref 30.00–100.00)

## 2023-07-30 LAB — TSH: TSH: 1.54 u[IU]/mL (ref 0.35–5.50)

## 2023-07-30 MED ORDER — BUSPIRONE HCL 7.5 MG PO TABS: 7.5000 mg | ORAL_TABLET | Freq: Two times a day (BID) | ORAL | 0 refills | Status: DC

## 2023-07-30 NOTE — Assessment & Plan Note (Signed)
Chronic. Stable with diet control. Will check lipid panel today. Encouraged healthy diet and exercise.

## 2023-07-30 NOTE — Assessment & Plan Note (Signed)
Controlled with diet only during recent pregnancy. Will check A1c today. Encouraged healthy diet and exercise.

## 2023-07-30 NOTE — Assessment & Plan Note (Signed)
Physical exam complete. Lab work as outlined. Will contact patient with results. Pap- UTD. Declined flu vaccine. Tetanus vaccine- UTD. Declined all COVID vaccines. HIV screening- negative. Hep C screening- negative. Recommended follow ups with Dentist and Ophthalmology for annual exams. Encouraged healthy diet and exercise. Return to care in 4 weeks, sooner PRN.

## 2023-07-30 NOTE — Assessment & Plan Note (Addendum)
Patient started on Lexapro last week. Discussed with patient that this medication can take several weeks before seeing full effects. Encouraged to continue taking daily. Counseled patient on common side effects. Encouraged to contact if worsening symptoms, unusual behavior changes or suicidal thoughts occur. PHQ- 15 today. She endorses safety. She will follow up in 4 weeks, sooner PRN. Will continue to monitor.

## 2023-07-30 NOTE — Assessment & Plan Note (Signed)
No longer on iron supplement. Will check iron panel today to monitor.

## 2023-07-30 NOTE — Assessment & Plan Note (Signed)
Chronic issue. Worsening symptoms since having a baby in June. GAD- 14 today. Will start patient on Buspar 7.5 mg BID. She will continue Lexapro daily. Counseled on common side effects. She will follow up in 4 weeks, sooner PRN. Will continue to monitor.

## 2023-08-27 ENCOUNTER — Encounter: Payer: Self-pay | Admitting: Nurse Practitioner

## 2023-08-27 ENCOUNTER — Ambulatory Visit: Payer: No Typology Code available for payment source | Admitting: Nurse Practitioner

## 2023-08-27 VITALS — BP 110/72 | HR 73 | Temp 98.1°F | Ht 61.0 in | Wt 157.6 lb

## 2023-08-27 DIAGNOSIS — F410 Panic disorder [episodic paroxysmal anxiety] without agoraphobia: Secondary | ICD-10-CM

## 2023-08-27 DIAGNOSIS — F53 Postpartum depression: Secondary | ICD-10-CM

## 2023-08-27 DIAGNOSIS — F411 Generalized anxiety disorder: Secondary | ICD-10-CM | POA: Diagnosis not present

## 2023-08-27 MED ORDER — ESCITALOPRAM OXALATE 10 MG PO TABS
10.0000 mg | ORAL_TABLET | Freq: Every day | ORAL | 3 refills | Status: DC
Start: 2023-08-27 — End: 2023-10-13

## 2023-08-27 MED ORDER — BUSPIRONE HCL 7.5 MG PO TABS: 7.5000 mg | ORAL_TABLET | Freq: Two times a day (BID) | ORAL | 2 refills | Status: DC

## 2023-08-27 NOTE — Progress Notes (Signed)
  Bethanie Dicker, NP-C Phone: [phone]  Natalie Welch is a 32 y.o. female who presents today for follow up.   Anxiety/Depression- Patient started on Lexapro 10 mg daily and Buspar 7.5 mg BID last month for increased anxiety, panic attacks and postpartum depression after having a baby boy in June. She presents today feeling much better. She has noticed a significant improvement in her symptoms, she feels that it is "night and day" compared to how she was feeling. She states that starting on the Buspar "has changed her life." She started back to work this week since being on maternity leave and is doing well so far with the transition. PHQ- 1 and GAD- 4 today.   Social History   Tobacco Use  Smoking Status Never  Smokeless Tobacco Never    No current outpatient medications on file prior to visit.   No current facility-administered medications on file prior to visit.    ROS see history of present illness  Objective  Physical Exam Vitals:   08/27/23 0814  BP: 110/72  Pulse: 73  Temp: 98.1 F (36.7 C)  SpO2: 98%    BP Readings from Last 3 Encounters:  08/27/23 110/72  07/30/23 122/78  06/24/23 124/84   Wt Readings from Last 3 Encounters:  08/27/23 157 lb 9.6 oz (71.5 kg)  07/30/23 154 lb (69.9 kg)  05/11/23 155 lb 9.6 oz (70.6 kg)    Physical Exam Constitutional:      General: She is not in acute distress.    Appearance: Normal appearance.  HENT:     Head: Normocephalic.  Cardiovascular:     Rate and Rhythm: Normal rate and regular rhythm.     Heart sounds: Normal heart sounds.  Pulmonary:     Effort: Pulmonary effort is normal.     Breath sounds: Normal breath sounds.  Skin:    General: Skin is warm and dry.  Neurological:     General: No focal deficit present.     Mental Status: She is alert.  Psychiatric:        Mood and Affect: Mood normal.        Behavior: Behavior normal.    Assessment/Plan: Please see individual problem list.  Postpartum  depression Assessment & Plan: Significant improvement in symptoms. She will continue Lexapro 10 mg daily. PHQ- 1 today. Encouraged to contact if worsening symptoms, unusual behavior changes or suicidal thoughts occur.   Orders: -     Escitalopram Oxalate; Take 1 tablet (10 mg total) by mouth daily.  Dispense: 90 tablet; Refill: 3  Generalized anxiety disorder with panic attacks Assessment & Plan: Significant improvement in symptoms with Lexapro and Buspar. She will continue Buspar 7.5 mg BID and Lexapro 10 mg daily. Refills sent. GAD- 4 today. She will contact if her symptoms are worsening or changing.   Orders: -     Escitalopram Oxalate; Take 1 tablet (10 mg total) by mouth daily.  Dispense: 90 tablet; Refill: 3 -     busPIRone HCl; Take 1 tablet (7.5 mg total) by mouth 2 (two) times daily.  Dispense: 180 tablet; Refill: 2   Return in about 11 months (around 08/01/2024) for Annual Exam, sooner PRN.   Bethanie Dicker, NP-C [facility] - [employer]

## 2023-08-27 NOTE — Assessment & Plan Note (Addendum)
Significant improvement in symptoms with Lexapro and Buspar. She will continue Buspar 7.5 mg BID and Lexapro 10 mg daily. Refills sent. GAD- 4 today. She will contact if her symptoms are worsening or changing.

## 2023-08-27 NOTE — Assessment & Plan Note (Signed)
Significant improvement in symptoms. She will continue Lexapro 10 mg daily. PHQ- 1 today. Encouraged to contact if worsening symptoms, unusual behavior changes or suicidal thoughts occur.

## 2023-09-02 ENCOUNTER — Encounter: Payer: Self-pay | Admitting: Nurse Practitioner

## 2023-09-02 ENCOUNTER — Other Ambulatory Visit: Payer: Self-pay | Admitting: Nurse Practitioner

## 2023-09-02 DIAGNOSIS — R110 Nausea: Secondary | ICD-10-CM

## 2023-09-02 MED ORDER — ONDANSETRON HCL 4 MG PO TABS: 4.0000 mg | ORAL_TABLET | Freq: Three times a day (TID) | ORAL | 0 refills | Status: DC | PRN

## 2023-09-21 ENCOUNTER — Other Ambulatory Visit: Payer: Self-pay | Admitting: Nurse Practitioner

## 2023-09-21 DIAGNOSIS — F410 Panic disorder [episodic paroxysmal anxiety] without agoraphobia: Secondary | ICD-10-CM

## 2023-09-21 MED ORDER — HYDROXYZINE PAMOATE 25 MG PO CAPS: 25.0000 mg | ORAL_CAPSULE | Freq: Three times a day (TID) | ORAL | 1 refills | Status: DC | PRN

## 2023-10-13 ENCOUNTER — Encounter: Payer: Self-pay | Admitting: Nurse Practitioner

## 2023-10-13 ENCOUNTER — Ambulatory Visit (INDEPENDENT_AMBULATORY_CARE_PROVIDER_SITE_OTHER): Payer: No Typology Code available for payment source | Admitting: Nurse Practitioner

## 2023-10-13 VITALS — BP 124/80 | HR 82 | Temp 99.1°F | Ht 61.0 in | Wt 161.6 lb

## 2023-10-13 DIAGNOSIS — F410 Panic disorder [episodic paroxysmal anxiety] without agoraphobia: Secondary | ICD-10-CM | POA: Diagnosis not present

## 2023-10-13 DIAGNOSIS — F53 Postpartum depression: Secondary | ICD-10-CM

## 2023-10-13 DIAGNOSIS — F411 Generalized anxiety disorder: Secondary | ICD-10-CM | POA: Diagnosis not present

## 2023-10-13 MED ORDER — BUSPIRONE HCL 5 MG PO TABS
5.0000 mg | ORAL_TABLET | Freq: Two times a day (BID) | ORAL | 3 refills | Status: DC
Start: 2023-10-13 — End: 2024-01-06

## 2023-10-13 NOTE — Assessment & Plan Note (Signed)
She reports improvement and does not feel the need for an SSRI at this time. We will monitor for any changes in mood or depressive symptoms. PHQ- 5 today.

## 2023-10-13 NOTE — Progress Notes (Signed)
Natalie Dicker, NP-C Phone: [phone]  Natalie Welch is a 32 y.o. female who presents today for anxiety.   Discussed the use of AI scribe software for clinical note transcription with the patient, who gave verbal consent to proceed.  History of Present Illness   The patient, previously on Lexapro and Buspar for anxiety, reported a sudden onset of nausea and vomiting while at work. The symptoms were severe enough to cause dry heaving and an inability to hold her head up at her desk. The patient was prescribed Zofran for these symptoms, but the nausea and vomiting persisted. The patient then discontinued Buspar, which led to the resolution of the nausea and vomiting, indicating that Buspar was the likely cause.  The patient was then switched to hydroxyzine, but reported that it made her feel like a "zombie" and could only be taken at night due to its sedative effects. Despite this, the patient found the hydroxyzine beneficial for sleep, particularly in managing her partner's snoring.  The patient expressed a desire to return to Buspar, as she felt it was effective in managing her daily anxiety. She reported feeling "anxious all day, every day" and felt that Buspar had previously helped her reach a "great place" in managing her anxiety.  The patient also reported a history of insomnia while on Lexapro, which improved after discontinuing the medication. She did not feel the need to be on an antidepressant like Lexapro, as she believed her primary issue was anxiety, not depression.  The patient was not taking any other medications at the time of the consultation, except for hydroxyzine at night as needed. She reported no other health concerns.      Social History   Tobacco Use  Smoking Status Never  Smokeless Tobacco Never    Current Outpatient Medications on File Prior to Visit  Medication Sig Dispense Refill   hydrOXYzine (VISTARIL) 25 MG capsule Take 1 capsule (25 mg total) by mouth  every 8 (eight) hours as needed for anxiety. 90 capsule 1   No current facility-administered medications on file prior to visit.    ROS see history of present illness  Objective  Physical Exam Vitals:   10/13/23 0851  BP: 124/80  Pulse: 82  Temp: 99.1 F (37.3 C)  SpO2: 98%    BP Readings from Last 3 Encounters:  10/13/23 124/80  08/27/23 110/72  07/30/23 122/78   Wt Readings from Last 3 Encounters:  10/13/23 161 lb 9.6 oz (73.3 kg)  08/27/23 157 lb 9.6 oz (71.5 kg)  07/30/23 154 lb (69.9 kg)    Physical Exam Constitutional:      General: She is not in acute distress.    Appearance: Normal appearance.  HENT:     Head: Normocephalic.  Cardiovascular:     Rate and Rhythm: Normal rate and regular rhythm.     Heart sounds: Normal heart sounds.  Pulmonary:     Effort: Pulmonary effort is normal.     Breath sounds: Normal breath sounds.  Skin:    General: Skin is warm and dry.  Neurological:     General: No focal deficit present.     Mental Status: She is alert.  Psychiatric:        Mood and Affect: Mood normal.        Behavior: Behavior normal.    Assessment/Plan: Please see individual problem list.  Generalized anxiety disorder with panic attacks Assessment & Plan: Previously well-controlled on Buspar, she developed severe nausea leading to its discontinuation.  Hydroxyzine at night provides some relief but causes excessive daytime drowsiness. She reports significant benefit from Buspar prior to the onset of side effects. We will resume Buspar at a lower dose of 5mg  twice daily and continue Hydroxyzine 25mg  at night as needed for sleep. We will consider further adjustment of Buspar dose based on tolerance and efficacy. She will contact if symptoms remain uncontrolled or new side effects develop. GAD- 15 today.   Orders: -     busPIRone HCl; Take 1 tablet (5 mg total) by mouth 2 (two) times daily.  Dispense: 180 tablet; Refill: 3  Postpartum  depression Assessment & Plan: She reports improvement and does not feel the need for an SSRI at this time. We will monitor for any changes in mood or depressive symptoms. PHQ- 5 today.     Return if symptoms worsen or fail to improve, for Annual Exam on 08/11/2024 as scheduled.   Natalie Dicker, NP-C [facility] - [employer]

## 2023-10-13 NOTE — Assessment & Plan Note (Addendum)
Previously well-controlled on Buspar, she developed severe nausea leading to its discontinuation. Hydroxyzine at night provides some relief but causes excessive daytime drowsiness. She reports significant benefit from Buspar prior to the onset of side effects. We will resume Buspar at a lower dose of 5mg  twice daily and continue Hydroxyzine 25mg  at night as needed for sleep. We will consider further adjustment of Buspar dose based on tolerance and efficacy. She will contact if symptoms remain uncontrolled or new side effects develop. GAD- 15 today.

## 2023-10-15 ENCOUNTER — Other Ambulatory Visit: Payer: Self-pay | Admitting: Nurse Practitioner

## 2023-10-15 DIAGNOSIS — F410 Panic disorder [episodic paroxysmal anxiety] without agoraphobia: Secondary | ICD-10-CM

## 2024-01-06 ENCOUNTER — Telehealth: Payer: Self-pay

## 2024-01-06 ENCOUNTER — Ambulatory Visit: Admitting: Nurse Practitioner

## 2024-01-06 VITALS — BP 126/86 | HR 72 | Temp 98.5°F | Ht 61.0 in | Wt 162.6 lb

## 2024-01-06 DIAGNOSIS — F411 Generalized anxiety disorder: Secondary | ICD-10-CM

## 2024-01-06 DIAGNOSIS — E669 Obesity, unspecified: Secondary | ICD-10-CM

## 2024-01-06 DIAGNOSIS — F410 Panic disorder [episodic paroxysmal anxiety] without agoraphobia: Secondary | ICD-10-CM | POA: Diagnosis not present

## 2024-01-06 DIAGNOSIS — G5602 Carpal tunnel syndrome, left upper limb: Secondary | ICD-10-CM

## 2024-01-06 MED ORDER — ZEPBOUND 2.5 MG/0.5ML ~~LOC~~ SOAJ: 2.5000 mg | SUBCUTANEOUS | 0 refills | Status: DC

## 2024-01-06 MED ORDER — ZEPBOUND 5 MG/0.5ML ~~LOC~~ SOAJ: 5.0000 mg | SUBCUTANEOUS | 0 refills | Status: DC

## 2024-01-06 NOTE — Telephone Encounter (Signed)
 PA needed for zepbound

## 2024-01-06 NOTE — Progress Notes (Unsigned)
 Natalie Dicker, NP-C Phone: [phone]  Natalie Welch is a 33 y.o. female who presents today for weight management.   Discussed the use of AI scribe software for clinical note transcription with the patient, who gave verbal consent to proceed.  History of Present Illness   The patient presents with interest in weight loss management.  She has been struggling to lose weight despite efforts to exercise and eat healthier. She uses a treadmill and walks when possible, but her busy schedule, including overtime work, makes it difficult to maintain consistency. Her self-esteem is low, and she feels frustrated with her current weight, which affects her mood and relationship with her husband. She mentions crying before the visit due to dissatisfaction with her appearance and difficulty finding clothes that fit.  She has a history of anxiety and depression, previously managed with Buspar and Lexapro, but discontinued due to side effects. Currently, she takes hydroxyzine at night for anxiety and sleep, which helps her sleep but can cause grogginess if taken too late. Her depression has improved with warmer weather, allowing her to be more active, but her anxiety remains a constant issue. She believes that weight loss may improve her mood and self-esteem.  She experiences numbness in her fingers, particularly when driving, doing her hair, or putting on makeup. This is more pronounced in her left hand, which is her dominant hand. 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  Her dietary habits include cooking at home more often, but she still eats out frequently due to time constraints. She has been craving salads and feels she is making better food choices, such as opting for salads at fast-food restaurants.      Social History   Tobacco Use  Smoking Status Never  Smokeless Tobacco Never    Current Outpatient Medications on File Prior to Visit  Medication Sig  Dispense Refill   hydrOXYzine (VISTARIL) 25 MG capsule TAKE 1 CAPSULE (25 MG TOTAL) BY MOUTH EVERY 8 (EIGHT) HOURS AS NEEDED FOR ANXIETY. 270 capsule 1   No current facility-administered medications on file prior to visit.    ROS see history of present illness  Objective  Physical Exam Vitals:   01/06/24 1442  BP: 126/86  Pulse: 72  Temp: 98.5 F (36.9 C)  SpO2: 98%    BP Readings from Last 3 Encounters:  01/06/24 126/86  10/13/23 124/80  08/27/23 110/72   Wt Readings from Last 3 Encounters:  01/06/24 162 lb 9.6 oz (73.8 kg)  10/13/23 161 lb 9.6 oz (73.3 kg)  08/27/23 157 lb 9.6 oz (71.5 kg)    Physical Exam Constitutional:      General: She is not in acute distress.    Appearance: Normal appearance.  HENT:     Head: Normocephalic.  Cardiovascular:     Rate and Rhythm: Normal rate and regular rhythm.     Heart sounds: Normal heart sounds.  Pulmonary:     Effort: Pulmonary effort is normal.     Breath sounds: Normal breath sounds.  Skin:    General: Skin is warm and dry.  Neurological:     General: No focal deficit present.     Mental Status: She is alert.  Psychiatric:        Mood and Affect: Mood normal.        Behavior: Behavior normal.     Assessment/Plan: Please see individual problem list.  Obesity (BMI 30-39.9) Assessment & Plan: She qualifies for  GLP-1 with a BMI of 30. Zepbound was discussed for weight loss, highlighting healthy diet, regular exercise and the potential for weight regain after stopping the medication if lifestyle modifications are not maintained. Side effects and insurance considerations were explained. Prescribe Zepbound starting at 2.5 mg weekly for four weeks, then increase to 5 mg. Counseled on the risk of pancreatitis and gallbladder disease. Discussed the risk of nausea. Advised to discontinue the Zepbound and contact us immediately if they develop abdominal pain. If they develop excessive nausea they will contact us right away.  I discussed that medullary thyroid cancer has been seen in rats studies. The patient confirmed no personal or family history of thyroid cancer, parathyroid cancer, or adrenal gland cancer. Discussed that we thus far have not seen medullary thyroid cancer result from use of this type of medication in humans. Advised to monitor the thyroid area and contact us for any lumps, swelling, trouble swallowing, or any other changes in this area.  Discussed goal weight loss of 1 to 2 pounds a week while on this medication. Discussed the importance of healthy diet, exercise and lifestyle modifications even with this medication. Schedule a follow-up in six weeks to assess weight loss and medication tolerance.   Orders: -     Zepbound; Inject 2.5 mg into the skin once a week.  Dispense: 2 mL; Refill: 0 -     Zepbound; Inject 5 mg into the skin once a week.  Dispense: 2 mL; Refill: 0  Generalized anxiety disorder with panic attacks Assessment & Plan: She experiences ongoing anxiety with improved depression in warmer weather. Hydroxyzine aids sleep. Previous medications were discontinued due to side effects. Weight loss may improve mood and self-esteem. Continue hydroxyzine at night for anxiety and sleep. Monitor mood and anxiety symptoms, especially after starting weight loss treatment. Consider re-evaluating medication options if mood does not improve with weight loss.   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 Recommend wrist braces for nighttime use to relieve nerve pressure. Monitor symptoms and consider further intervention if they worsen.    Return in about 6 weeks (around 02/17/2024) for Follow up.   Natalie Dicker, NP-C [facility] - [facility]

## 2024-01-07 ENCOUNTER — Other Ambulatory Visit (HOSPITAL_COMMUNITY): Payer: Self-pay

## 2024-01-07 ENCOUNTER — Telehealth: Payer: Self-pay

## 2024-01-07 NOTE — Telephone Encounter (Signed)
 Pharmacy Patient Advocate Encounter   Received notification from Pt Calls Messages that prior authorization for Zepbound 2.5 is required/requested.   Insurance verification completed.   The patient is insured through [employer] .   Per test claim: PA required; PA submitted to above mentioned insurance via CoverMyMeds Key/confirmation #/EOC [facility] Status is pending

## 2024-01-08 ENCOUNTER — Encounter: Payer: Self-pay | Admitting: Nurse Practitioner

## 2024-01-08 DIAGNOSIS — E669 Obesity, unspecified: Secondary | ICD-10-CM | POA: Insufficient documentation

## 2024-01-08 DIAGNOSIS — G5602 Carpal tunnel syndrome, left upper limb: Secondary | ICD-10-CM | POA: Insufficient documentation

## 2024-01-08 NOTE — Assessment & Plan Note (Addendum)
 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 Recommend wrist braces for nighttime use to relieve nerve pressure. Monitor symptoms and consider further intervention if they worsen.

## 2024-01-08 NOTE — Assessment & Plan Note (Signed)
 She qualifies for GLP-1 with a BMI of 30. Zepbound was discussed for weight loss, highlighting healthy diet, regular exercise and the potential for weight regain after stopping the medication if lifestyle modifications are not maintained. Side effects and insurance considerations were explained. Prescribe Zepbound starting at 2.5 mg weekly for four weeks, then increase to 5 mg. Counseled on the risk of pancreatitis and gallbladder disease. Discussed the risk of nausea. Advised to discontinue the Zepbound and contact us immediately if they develop abdominal pain. If they develop excessive nausea they will contact us right away. I discussed that medullary thyroid cancer has been seen in rats studies. The patient confirmed no personal or family history of thyroid cancer, parathyroid cancer, or adrenal gland cancer. Discussed that we thus far have not seen medullary thyroid cancer result from use of this type of medication in humans. Advised to monitor the thyroid area and contact us for any lumps, swelling, trouble swallowing, or any other changes in this area.  Discussed goal weight loss of 1 to 2 pounds a week while on this medication. Discussed the importance of healthy diet, exercise and lifestyle modifications even with this medication. Schedule a follow-up in six weeks to assess weight loss and medication tolerance.

## 2024-01-08 NOTE — Assessment & Plan Note (Signed)
 She experiences ongoing anxiety with improved depression in warmer weather. Hydroxyzine aids sleep. Previous medications were discontinued due to side effects. Weight loss may improve mood and self-esteem. Continue hydroxyzine at night for anxiety and sleep. Monitor mood and anxiety symptoms, especially after starting weight loss treatment. Consider re-evaluating medication options if mood does not improve with weight loss.

## 2024-01-11 NOTE — Telephone Encounter (Signed)
 Pharmacy Patient Advocate Encounter  Received notification from [employer] that Prior Authorization for ZEPBOUND has been DENIED.  Full denial letter will be uploaded to the media tab. See denial reason below.   PA #/Case ID/Reference #: [id number]

## 2024-01-26 ENCOUNTER — Encounter: Payer: Self-pay | Admitting: Nurse Practitioner

## 2024-01-28 ENCOUNTER — Ambulatory Visit: Admitting: Nurse Practitioner

## 2024-02-02 ENCOUNTER — Other Ambulatory Visit (HOSPITAL_COMMUNITY): Payer: Self-pay

## 2024-02-04 ENCOUNTER — Ambulatory Visit: Admitting: Nurse Practitioner

## 2024-02-04 ENCOUNTER — Encounter: Payer: Self-pay | Admitting: Nurse Practitioner

## 2024-02-04 VITALS — BP 118/80 | HR 72 | Temp 98.0°F | Ht 61.0 in | Wt 162.0 lb

## 2024-02-04 DIAGNOSIS — F53 Postpartum depression: Secondary | ICD-10-CM | POA: Diagnosis not present

## 2024-02-04 DIAGNOSIS — F411 Generalized anxiety disorder: Secondary | ICD-10-CM | POA: Diagnosis not present

## 2024-02-04 DIAGNOSIS — F410 Panic disorder [episodic paroxysmal anxiety] without agoraphobia: Secondary | ICD-10-CM

## 2024-02-04 MED ORDER — SERTRALINE HCL 50 MG PO TABS: 50.0000 mg | ORAL_TABLET | Freq: Every day | ORAL | 0 refills | Status: DC
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
## 2024-02-05 ENCOUNTER — Other Ambulatory Visit: Payer: Self-pay

## 2024-02-05 ENCOUNTER — Ambulatory Visit: Admitting: Nurse Practitioner

## 2024-02-09 ENCOUNTER — Encounter: Payer: Self-pay | Admitting: Nurse Practitioner
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
## 2024-02-09 NOTE — Assessment & Plan Note (Addendum)
 See plan for postpartum depression. GAD- 17 today. Starting Zoloft 50 mg daily. Continue Hydroxyzine as needed.

## 2024-02-22 ENCOUNTER — Other Ambulatory Visit (HOSPITAL_COMMUNITY): Payer: Self-pay

## 2024-02-22 ENCOUNTER — Telehealth: Payer: Self-pay

## 2024-02-22 NOTE — Telephone Encounter (Signed)
 Pharmacy Patient Advocate Encounter  Received notification from [facility] that Prior Authorization for Zepbound  2.5MG /0.5ML pen-injectors I DO NOT SEE AND RESENT SCRIPT IN PT CHART FOR Zepbound  2.5MG /0.5ML pen-injectors AND I SEE A DENIAL FROM A MONTH AGE FOR THIS JUST CHECKING TO SEE IF PT IS STILL TAKING ZEPBOUND  AND NEED A PA PROCESS PLEASE BE ADVISE AND LET US  KNOW SHOULD WE PROCEED WITH THE PA   KEY [id number]

## 2024-02-23 NOTE — Telephone Encounter (Signed)
 Called and spoke with pt and she stated she is not taking zepbound  as it was denied but she keep receiving messages from [facility] about [facility]. [facility] to get [facility] spoke to an Angie who stated she would make the medication inactive

## 2024-02-25 ENCOUNTER — Other Ambulatory Visit (HOSPITAL_COMMUNITY): Payer: Self-pay

## 2024-03-03 ENCOUNTER — Encounter: Payer: Self-pay | Admitting: Nurse Practitioner

## 2024-03-03 ENCOUNTER — Ambulatory Visit: Admitting: Nurse Practitioner

## 2024-03-03 VITALS — BP 118/66 | HR 72 | Temp 98.5°F | Ht 61.0 in | Wt 156.6 lb

## 2024-03-03 DIAGNOSIS — F53 Postpartum depression: Secondary | ICD-10-CM

## 2024-03-03 DIAGNOSIS — F411 Generalized anxiety disorder: Secondary | ICD-10-CM | POA: Diagnosis not present

## 2024-03-03 DIAGNOSIS — F410 Panic disorder [episodic paroxysmal anxiety] without agoraphobia: Secondary | ICD-10-CM | POA: Diagnosis not present

## 2024-03-03 NOTE — Assessment & Plan Note (Signed)
 Her anxiety and depression are improving with current treatment. She experiences no side effects from Zoloft  or hydroxyzine . A dose increase may be considered if necessary. Counseling options are being explored, with concerns about cost and insurance. Continue Zoloft  50 mg daily and hydroxyzine  as needed for sleep. Evaluate the need for a Zoloft  dose increase in a few weeks, considering 75 mg or 100 mg if required. Explore counseling options, ensuring insurance coverage and considering online therapy if cost is prohibitive. Schedule a follow-up if needed before the next physical in October. Encouraged to contact if worsening symptoms, unusual behavior changes or suicidal thoughts occur.

## 2024-03-03 NOTE — Assessment & Plan Note (Signed)
 See plan for postpartum depression. GAD- 5 today. Continue Zoloft  50 mg daily and Hydroxyzine  at bedtime as needed.

## 2024-03-03 NOTE — Progress Notes (Signed)
 Bluford Burkitt, NP-C Phone: [phone]  Natalie Welch is a 33 y.o. female who presents today for follow up.   Discussed the use of AI scribe software for clinical note transcription with the patient, who gave verbal consent to proceed.  History of Present Illness   Natalie Welch is a 33 year old female who presents for follow-up of anxiety and depression management.  She feels an overall improvement with her current medication regimen, describing an 'internal change' and feeling less like a 'stress ball'. She is taking Zoloft  50 mg and hydroxyzine  at night, which she finds effective for her symptoms. She initially stopped hydroxyzine  due to perceived ineffectiveness for sleep but resumed it after experiencing wakefulness. No nausea or headaches are present.  She transitioned from Lexapro  to Zoloft  about a month ago, having stopped Lexapro  prior to starting Zoloft . Before starting Zoloft , she was only taking hydroxyzine  and not on any SSRI. She notes improvement in her screenings, PHQ went from 15 to 3 and GAD went from 17 to 5, though some symptoms persist intermittently.  She is considering counseling and exploring options, including online therapy, but is concerned about the high cost of some services. She is interested in finding a Christian-based therapist and is checking her insurance coverage for therapy.   Social History   Tobacco Use  Smoking Status Never  Smokeless Tobacco Never    Current Outpatient Medications on File Prior to Visit  Medication Sig Dispense Refill   hydrOXYzine  (VISTARIL ) 25 MG capsule TAKE 1 CAPSULE (25 MG TOTAL) BY MOUTH EVERY 8 (EIGHT) HOURS AS NEEDED FOR ANXIETY. 270 capsule 1   sertraline  (ZOLOFT ) 50 MG tablet Take 1 tablet (50 mg total) by mouth daily. 90 tablet 0   No current facility-administered medications on file prior to visit.     ROS see history of present illness  Objective  Physical Exam Vitals:   03/03/24 1301  BP: 118/66   Pulse: 72  Temp: 98.5 F (36.9 C)  SpO2: 95%    BP Readings from Last 3 Encounters:  03/03/24 118/66  02/04/24 118/80  01/06/24 126/86   Wt Readings from Last 3 Encounters:  03/03/24 156 lb 9.6 oz (71 kg)  02/04/24 162 lb (73.5 kg)  01/06/24 162 lb 9.6 oz (73.8 kg)    Physical Exam Constitutional:      General: She is not in acute distress.    Appearance: Normal appearance.  HENT:     Head: Normocephalic.  Cardiovascular:     Rate and Rhythm: Normal rate and regular rhythm.     Heart sounds: Normal heart sounds.  Pulmonary:     Effort: Pulmonary effort is normal.     Breath sounds: Normal breath sounds.  Skin:    General: Skin is warm and dry.  Neurological:     General: No focal deficit present.     Mental Status: She is alert.  Psychiatric:        Mood and Affect: Mood normal.        Behavior: Behavior normal.     Assessment/Plan: Please see individual problem list.  Postpartum depression Assessment & Plan: Her anxiety and depression are improving with current treatment. She experiences no side effects from Zoloft  or hydroxyzine . A dose increase may be considered if necessary. Counseling options are being explored, with concerns about cost and insurance. Continue Zoloft  50 mg daily and hydroxyzine  as needed for sleep. Evaluate the need for a Zoloft  dose increase in a few weeks, considering 75  mg or 100 mg if required. Explore counseling options, ensuring insurance coverage and considering online therapy if cost is prohibitive. Schedule a follow-up if needed before the next physical in October. Encouraged to contact if worsening symptoms, unusual behavior changes or suicidal thoughts occur.    Generalized anxiety disorder with panic attacks Assessment & Plan: See plan for postpartum depression. GAD- 5 today. Continue Zoloft  50 mg daily and Hydroxyzine  at bedtime as needed.      Return in 23 weeks (on 08/11/2024) for Annual Exam, as scheduled, sooner as  needed.   Bluford Burkitt, NP-C [facility]

## 2024-04-07 ENCOUNTER — Encounter: Payer: Self-pay | Admitting: Dermatology

## 2024-04-07 ENCOUNTER — Ambulatory Visit: Admitting: Dermatology

## 2024-04-07 DIAGNOSIS — L700 Acne vulgaris: Secondary | ICD-10-CM

## 2024-04-07 DIAGNOSIS — L905 Scar conditions and fibrosis of skin: Secondary | ICD-10-CM

## 2024-04-07 MED ORDER — DOXYCYCLINE MONOHYDRATE 100 MG PO CAPS: 100.0000 mg | ORAL_CAPSULE | Freq: Two times a day (BID) | ORAL | 3 refills | Status: DC

## 2024-04-07 MED ORDER — TRETINOIN 0.025 % EX CREA: TOPICAL_CREAM | CUTANEOUS | 5 refills | Status: DC

## 2024-04-07 MED ORDER — SPIRONOLACTONE 50 MG PO TABS: 50.0000 mg | ORAL_TABLET | Freq: Every day | ORAL | 3 refills | Status: DC

## 2024-04-07 NOTE — Patient Instructions (Signed)
 Start Spironolactone 50 mg once daily  Start Doxycycline  100 mg twice a day with food  Start Tretinoin 0.025% cream apply pea-sized amount to entire face at bedtime, was off in morning. Start out 2 nights per week for 1 month then gradually increase to every night as tolerated.   Recommend using CeraVe cleanser at bedtime, Panoxyl in the morning.   Spironolactone can cause increased urination and cause blood pressure to decrease. Please watch for signs of lightheadedness and be cautious when changing position. It can sometimes cause breast tenderness or an irregular period in premenopausal women. It can also increase potassium. The increase in potassium usually is not a concern unless you are taking other medicines that also increase potassium, so please be sure your doctor knows all of the other medications you are taking. This medication should not be taken by pregnant women.  This medicine should also not be taken together with sulfa drugs like Bactrim (trimethoprim/sulfamethexazole).   Doxycycline  should be taken with food to prevent nausea. Do not lay down for 30 minutes after taking. Be cautious with sun exposure and use good sun protection while on this medication. Pregnant women should not take this medication.   Topical retinoid medications like tretinoin/Retin-A, adapalene/Differin, tazarotene/Fabior, and Epiduo/Epiduo Forte can cause dryness and irritation when first started. Only apply a pea-sized amount to the entire affected area. Avoid applying it around the eyes, edges of mouth and creases at the nose. If you experience irritation, use a good moisturizer first and/or apply the medicine less often. If you are doing well with the medicine, you can increase how often you use it until you are applying every night. Be careful with sun protection while using this medication as it can make you sensitive to the sun. This medicine should not be used by pregnant women.     Recommend daily broad  spectrum sunscreen SPF 30+ to sun-exposed areas, reapply every 2 hours as needed. Call for new or changing lesions.  Staying in the shade or wearing long sleeves, sun glasses (UVA+UVB protection) and wide brim hats (4-inch brim around the entire circumference of the hat) are also recommended for sun protection.      Due to recent changes in healthcare laws, you may see results of your pathology and/or laboratory studies on MyChart before the doctors have had a chance to review them. We understand that in some cases there may be results that are confusing or concerning to you. Please understand that not all results are received at the same time and often the doctors may need to interpret multiple results in order to provide you with the best plan of care or course of treatment. Therefore, we ask that you please give us  2 business days to thoroughly review all your results before contacting the office for clarification. Should we see a critical lab result, you will be contacted sooner.   If You Need Anything After Your Visit  If you have any questions or concerns for your doctor, please call our main line at [phone] and press option 4 to reach your doctor's medical assistant. If no one answers, please leave a voicemail as directed and we will return your call as soon as possible. Messages left after 4 pm will be answered the following business day.   You may also send us  a message via MyChart. We typically respond to MyChart messages within 1-2 business days.  For prescription refills, please ask your pharmacy to contact our office. Our fax number is  [phone].  If you have an urgent issue when the clinic is closed that cannot wait until the next business day, you can page your doctor at the number below.    Please note that while we do our best to be available for urgent issues outside of office hours, we are not available 24/7.   If you have an urgent issue and are unable to reach us ,  you may choose to seek medical care at your doctor's office, retail clinic, urgent care center, or emergency room.  If you have a medical emergency, please immediately call 911 or go to the emergency department.  Pager Numbers  - Dr. Bary Likes: [phone]  - Dr. Annette Barters: [phone]  - Dr. Felipe Horton: [phone]   In the event of inclement weather, please call our main line at [phone] for an update on the status of any delays or closures.  Dermatology Medication Tips: Please keep the boxes that topical medications come in in order to help keep track of the instructions about where and how to use these. Pharmacies typically print the medication instructions only on the boxes and not directly on the medication tubes.   If your medication is too expensive, please contact our office at [phone] option 4 or send us  a message through MyChart.   We are unable to tell what your co-pay for medications will be in advance as this is different depending on your insurance coverage. However, we may be able to find a substitute medication at lower cost or fill out paperwork to get insurance to cover a needed medication.   If a prior authorization is required to get your medication covered by your insurance company, please allow us  1-2 business days to complete this process.  Drug prices often vary depending on where the prescription is filled and some pharmacies may offer cheaper prices.  The website www.goodrx.com contains coupons for medications through different pharmacies. The prices here do not account for what the cost may be with help from insurance (it may be cheaper with your insurance), but the website can give you the price if you did not use any insurance.  - You can print the associated coupon and take it with your prescription to the pharmacy.  - You may also stop by our office during regular business hours and pick up a GoodRx coupon card.  - If you need your prescription sent  electronically to a different pharmacy, notify our office through [facility] or by phone at [phone] option 4.     Si Usted Necesita Algo Despus de Su Visita  Tambin puede enviarnos un mensaje a travs de Clinical cytogeneticist. Por lo general respondemos a los mensajes de MyChart en el transcurso de 1 a 2 das hbiles.  Para renovar recetas, por favor pida a su farmacia que se ponga en contacto con nuestra oficina. Franz Jacks de fax es [place] [phone].  Si tiene un asunto urgente cuando la clnica est cerrada y que no puede esperar hasta el siguiente da hbil, puede llamar/localizar a su doctor(a) al nmero que aparece a continuacin.   Por favor, tenga en cuenta que aunque hacemos todo lo posible para estar disponibles para asuntos urgentes fuera del horario de [place], no estamos disponibles las 24 horas del da, los 7 [address]  [address] de la [place].   Si tiene un problema urgente y no puede comunicarse con nosotros, puede optar por buscar atencin mdica  en el consultorio de su doctor(a), en una clnica privada, en un centro de atencin  urgente o en una sala de emergencias.  Si tiene Engineer, drilling, por favor llame inmediatamente al 911 o vaya a la sala de emergencias.  Nmeros de bper  - Dr. Bary Likes: [phone]  - Dra. Annette Barters: [phone]  - Dr. Felipe Horton: [phone]   En caso de inclemencias del tiempo, por favor llame a Lajuan Pila principal al [phone] para una actualizacin sobre el [place] de cualquier retraso o cierre.  Consejos para la medicacin en dermatologa: Por favor, guarde las cajas en las que vienen los medicamentos de uso tpico para ayudarle a seguir las instrucciones sobre dnde y cmo usarlos. Las farmacias generalmente imprimen las instrucciones del medicamento slo en las cajas y no directamente en los tubos del [place].   Si su medicamento es muy caro, por favor, pngase en contacto con Bettyjane Brunet llamando al [phone] y presione la  opcin 4 o envenos un mensaje a travs de Clinical cytogeneticist.   No podemos decirle cul ser su copago por los medicamentos por adelantado ya que esto es diferente dependiendo de la cobertura de su seguro. Sin embargo, es posible que podamos encontrar un medicamento sustituto a Audiological scientist un formulario para que el seguro cubra el medicamento que se considera necesario.   Si se requiere una autorizacin previa para que su compaa de seguros [place] su medicamento, por favor permtanos de 1 a 2 das hbiles para completar este proceso.  Los precios de los medicamentos varan con frecuencia dependiendo del Environmental consultant de dnde se surte la receta y alguna farmacias pueden ofrecer precios ms baratos.  El sitio web www.goodrx.com tiene cupones para medicamentos de Health and safety inspector. Los precios aqu no tienen en cuenta lo que podra costar con la ayuda del seguro (puede ser ms barato con su seguro), pero el sitio web puede darle el precio si no utiliz Tourist information centre manager.  - Puede imprimir el cupn correspondiente y llevarlo con su receta a la farmacia.  - Tambin puede pasar por nuestra oficina durante el horario de atencin regular y Education officer, museum una tarjeta de cupones de GoodRx.  - Si necesita que su receta se enve electrnicamente a una farmacia diferente, informe a nuestra oficina a travs de MyChart de [place] o por telfono llamando al [phone] y presione la opcin 4.

## 2024-04-07 NOTE — Progress Notes (Signed)
 Follow-Up Visit   Subjective  Natalie Welch is a 33 y.o. female who presents for the following: Acne Vulgaris. States she has hormonal acne. Looks better today.  Gets facials. No hx of Rx treatment. Painful.  Face and back. States she has always had trouble with acne prone skin. Using Panoxyl wash, CeraVe facial moisturizer, Colorscience sunscreen.   Hx of lightheadedness with exercise. States she is prediabetic. Hx of low potassium after birth of child 1 year ago.   The following portions of the chart were reviewed this encounter and updated as appropriate: medications, allergies, medical history  Review of Systems:  No other skin or systemic complaints except as noted in HPI or Assessment and Plan.  Objective  Well appearing patient in no apparent distress; mood and affect are within normal limits.  Areas Examined: Face, chest and back  Relevant exam findings are noted in the Assessment and Plan.                Assessment & Plan   SCAR, verrucous cyst excision 06/24/2023. Exam: pink smooth macule or patch. Benign-appearing.  Observation.  Call clinic for new or changing lesions. Recommend daily broad spectrum sunscreen SPF 30+, reapply every 2 hours as needed. Treatment: Recommend Serica moisturizing scar formula cream every night or [facility] brand or Mederma silicone scar sheet every night for the first year after a scar appears to help with scar remodeling if desired. Scars remodel on their own for a full year and will gradually improve in appearance over time.   ACNE VULGARIS   ACNE VULGARIS Exam: inflammatory pink papules, many with excoriation, on cheeks and jaw line > elsewhere on face and upper trunk  Chronic and persistent condition with duration or expected duration over one year. Condition is bothersome/symptomatic for patient. Currently flared.   Treatment Plan:  Start Spironolactone 50 mg once daily  Start Doxycycline  100 mg twice a day  with food  Start Tretinoin 0.025% cream apply pea-sized amount to entire face at bedtime, was off in morning. Start out 2 nights per week for 1 month then gradually increase to every night as tolerated.   Recommend using CeraVe cleanser at bedtime, Panoxyl in the morning.   Spironolactone can cause increased urination and cause blood pressure to decrease. Please watch for signs of lightheadedness and be cautious when changing position. It can sometimes cause breast tenderness or an irregular period in premenopausal women. It can also increase potassium. The increase in potassium usually is not a concern unless you are taking other medicines that also increase potassium, so please be sure your doctor knows all of the other medications you are taking. This medication should not be taken by pregnant women.  This medicine should also not be taken together with sulfa drugs like Bactrim (trimethoprim/sulfamethexazole).   Doxycycline  should be taken with food to prevent nausea. Do not lay down for 30 minutes after taking. Be cautious with sun exposure and use good sun protection while on this medication. Pregnant women should not take this medication.   Topical retinoid medications like tretinoin/Retin-A, adapalene/Differin, tazarotene/Fabior, and Epiduo/Epiduo Forte can cause dryness and irritation when first started. Only apply a pea-sized amount to the entire affected area. Avoid applying it around the eyes, edges of mouth and creases at the nose. If you experience irritation, use a good moisturizer first and/or apply the medicine less often. If you are doing well with the medicine, you can increase how often you use it until you are applying  every night. Be careful with sun protection while using this medication as it can make you sensitive to the sun. This medicine should not be used by pregnant women.     Return in about 3 months (around 07/08/2024) for Acne Follow Up.  I, Darcie Easterly, CMA, am acting  as scribe for Harris Liming, MD.   Documentation: I have reviewed the above documentation for accuracy and completeness, and I agree with the above.  Harris Liming, MD

## 2024-04-21 ENCOUNTER — Telehealth: Payer: Self-pay

## 2024-04-21 ENCOUNTER — Telehealth: Admitting: Family Medicine

## 2024-04-21 DIAGNOSIS — B379 Candidiasis, unspecified: Secondary | ICD-10-CM | POA: Diagnosis not present

## 2024-04-21 DIAGNOSIS — T3695XA Adverse effect of unspecified systemic antibiotic, initial encounter: Secondary | ICD-10-CM | POA: Diagnosis not present

## 2024-04-21 NOTE — Telephone Encounter (Signed)
 Patient left message stating she was seen on 6/12 and prescribed Doxycyline and typically gets yeast infections with abx and does have one now. Patient wanting to know if okay to discontinue medication or what to do? Patient states she did take a dose yesterday morning, but did not take one yesterday evening or this morning.   Please advise

## 2024-04-22 MED ORDER — FLUCONAZOLE 150 MG PO TABS
150.0000 mg | ORAL_TABLET | ORAL | 0 refills | Status: DC | PRN
Start: 2024-04-22 — End: 2024-08-11

## 2024-04-22 NOTE — Progress Notes (Signed)

## 2024-04-25 ENCOUNTER — Encounter: Payer: Self-pay | Admitting: Dermatology

## 2024-04-25 ENCOUNTER — Other Ambulatory Visit: Payer: Self-pay | Admitting: Dermatology

## 2024-04-26 MED ORDER — CLINDAMYCIN PHOS-BENZOYL PEROX 1.2-2.5 % EX GEL: CUTANEOUS | 1 refills | Status: AC

## 2024-04-26 NOTE — Telephone Encounter (Signed)
 Spoke with patient and advised her of Dr. Theressa response. Patient voiced understanding to all and would like clindamycin-benzoyl peroxide sent to [facility] on [address] in [place].

## 2024-05-01 ENCOUNTER — Other Ambulatory Visit: Payer: Self-pay | Admitting: Nurse Practitioner

## 2024-05-01 DIAGNOSIS — F53 Postpartum depression: Secondary | ICD-10-CM

## 2024-05-01 DIAGNOSIS — F410 Panic disorder [episodic paroxysmal anxiety] without agoraphobia: Secondary | ICD-10-CM

## 2024-07-14 ENCOUNTER — Ambulatory Visit: Admitting: Dermatology

## 2024-07-21 ENCOUNTER — Ambulatory Visit: Admitting: Dermatology

## 2024-07-21 ENCOUNTER — Encounter: Payer: Self-pay | Admitting: Dermatology

## 2024-07-21 DIAGNOSIS — Z7189 Other specified counseling: Secondary | ICD-10-CM

## 2024-07-21 DIAGNOSIS — L700 Acne vulgaris: Secondary | ICD-10-CM | POA: Diagnosis not present

## 2024-07-21 DIAGNOSIS — Z79899 Other long term (current) drug therapy: Secondary | ICD-10-CM

## 2024-07-21 MED ORDER — SPIRONOLACTONE 50 MG PO TABS: 50.0000 mg | ORAL_TABLET | Freq: Every day | ORAL | 0 refills | Status: DC

## 2024-07-21 MED ORDER — TRETINOIN 0.025 % EX CREA: TOPICAL_CREAM | CUTANEOUS | 0 refills | Status: DC

## 2024-07-21 NOTE — Patient Instructions (Signed)

## 2024-07-21 NOTE — Progress Notes (Signed)
   Follow-Up Visit   Subjective  Natalie Welch is a 33 y.o. female who presents for the following: Acne 42m f/u, face, upper trunk, Spironolactone  50mg  1 po qd, Tretinoin  0.025% cr qhs, Clindamycin /BP qam to spot treat, d/c Doxycycline  due to yeast infections, pt feels acne still bad, hx of tubal ligation, no hx or fhx of IBS, pt does have hx of depression/post partum depression  An examination of the face, neck, chest, and back was performed and relevant findings are noted below.   The following portions of the chart were reviewed this encounter and updated as appropriate: medications, allergies, medical history  Review of Systems:  No other skin or systemic complaints except as noted in HPI or Assessment and Plan.  Objective  Well appearing patient in no apparent distress; mood and affect are within normal limits.   A focused examination was performed of the following areas: Face, chest, back  Relevant exam findings are noted in the Assessment and Plan.    Assessment & Plan   ACNE VULGARIS Face, upper trunk Exam: excoriated inflamed paps on face upper back  Chronic and persistent condition with duration or expected duration over one year. Condition is symptomatic/ bothersome to patient. Not currently at goal.   Treatment Plan: Avoid picking at bumps Discussed Isotretinoin Isotretinoin Counseling; Review and Contraception Counseling: Reviewed potential side effects of isotretinoin including xerosis, cheilitis, hepatitis, hyperlipidemia, and severe birth defects if taken by a pregnant woman.  Women on isotretinoin must be celibate (not having sex) or required to use at least 2 birth control methods to prevent pregnancy (unless patient is a female of non-child bearing potential).  Females of child-bearing potential must have monthly pregnancy tests while on isotretinoin and report through I-Pledge (FDA monitoring program). Reviewed reports of suicidal ideation in those with a  history of depression while taking isotretinoin and reports of diagnosis of inflammatory bowl disease (IBD) while taking isotretinoin as well as the lack of evidence for a causal relationship between isotretinoin, depression and IBD. Patient advised to reach out with any questions or concerns. Patient advised not to share pills or donate blood while on treatment or for one month after completing treatment. All patient's considering Isotretinoin must read and understand and sign Isotretinoin Consent Form and be registered with I-Pledge.   Cont Clindamycin /BP qam to spot treat until start Isotretinoin Cont spironolactone  50mg  1 po qd until start Isotretinoin Cont Tretinoin  0.025% cr qhs until start Isotretinoin  Ipledge consents signed online today and scanned in chart. 2 forms of Birth Control Tubal ligation, condoms IPLEDGE REMS ID [id number] Urine pregnancy test performed in office today and was negative.  Patient demonstrates comprehension and confirms she will not get pregnant.  Lot [id number] exp 12/22/25   ACNE VULGARIS   LONG-TERM USE OF HIGH-RISK MEDICATION   COUNSELING AND COORDINATION OF CARE   MEDICATION MANAGEMENT    Return in about 1 month (around 08/20/2024) for Isotretinoin start.  I, Grayce Saunas, RMA, am acting as scribe for Boneta Sharps, MD .   Documentation: I have reviewed the above documentation for accuracy and completeness, and I agree with the above.  Boneta Sharps, MD

## 2024-07-31 ENCOUNTER — Telehealth: Admitting: Family

## 2024-07-31 DIAGNOSIS — B370 Candidal stomatitis: Secondary | ICD-10-CM

## 2024-07-31 MED ORDER — NYSTATIN 100000 UNIT/ML MT SUSP: 5.0000 mL | Freq: Four times a day (QID) | OROMUCOSAL | 1 refills | Status: DC

## 2024-07-31 NOTE — Progress Notes (Signed)
 E-Visit Treatment for Thrush symptoms  We are sorry that you are not feeling well. Here is how we plan to help!  Based on what you have shared with me, it appears that you have Thrush.  Natalie Welch is a fungal (yeast) infection that can grow in your mouth, throat, and other parts of your body. With oral thrush (oral candidiasis), you may develop white, raised, cottage cheese-like lesions (spots) on your tongue and cheeks. Natalie Welch can quickly become irritated and cause mouth pain and redness. Thrush usually develops suddenly. A common sign is the presence of creamy white, slightly raised lesions in your mouth -- usually on your tongue or inner cheeks. You may also have lesions on the roof of your mouth, gums, tonsils, or back of your throat.   Other symptoms may include: Redness and soreness inside and at the corners of your mouth Loss of sense of taste ([place]ia) Cottony feeling in your mouth  The lesions can hurt and may bleed a little when you scrape them or brush your teeth. In severe cases, the lesions can spread into your esophagus and cause: Pain or difficulty swallowing A feeling that food gets stuck in your throat or mid-chest area Fever, if the infection spreads beyond your esophagus  Most people have small amounts of the Candida fungus in their mouth, digestive tract and skin. When illnesses, stress or medications disturb this balance, the fungus grows out of control and causes thrush.  Medications that can make yeast flourish and cause infection include: Corticosteroids Inhalers Antibiotics Birth Control Pills  Natalie Welch can be contagious to those at risk (like people with weakened immune systems or who take certain medications). In people with healthy immune systems, it's unusual to pass thrush through kissing or other close contact. In most cases, thrush isn't particularly contagious (meaning, it doesn't spread from person to person), but it is transmittable (meaning, you can catch it  in other ways, like through saliva when you are immunocompromised).  I have prescribed I have prescribed an antifungal - Nystatin  suspension 100,000 units/mL Swish and swallow 5mL every 6 hours for up to 10-14 days  Prevention: Practice good oral hygiene. See your dentist regularly. This is especially important if you have diabetes or wear dentures. Limit the amount of sugar and yeast-containing foods you eat. Foods such as bread, beer and wine encourage Candida growth. Avoid smoking and other tobacco use. Ask your healthcare provider about ways to help you quit smoking (We do offer a smoking cessation program through the [facility] that you can schedule on your time through MyChart). With treatment, thrush usually goes away within one to two weeks. But if your symptoms linger or get worse, please seek in-person evaluation.  Home Care: Swish with warm saltwater. Take probiotics. Eat yogurt that contains healthy bacteria.  GET HELP RIGHT AWAY IF: Ulcers that are spreading, are very large or particularly painful Ulcers last longer than one week without improving on treatment If you develop a fever, swollen glands and begin to feel unwell  MAKE SURE YOU: Understand these instructions Will watch your condition. Will get help right away if you are not doing well or get worse.  Thank you for choosing an e-visit!  Your e-visit answers were reviewed by a board certified advanced clinical practitioner to complete your personal care plan. Depending upon the condition, your plan could have included both over the counter or prescription medications.  Please review your pharmacy choice. Make sure the pharmacy is open so  you can pick up prescription now. If there is a problem, you may contact your provider through [employer] and have the prescription routed to another pharmacy. Your safety is important to us . If you have drug allergies, check your prescription  carefully.  For the next 24 hours you can use MyChart to ask questions about today's visit, request a non-urgent call back, or ask for a work or school excuse.  You will get an email in the next two days asking about your experience. I hope that your e-visit has been valuable and will speed your recovery.

## 2024-07-31 NOTE — Progress Notes (Signed)
Approximately 5 minutes was spent documenting and reviewing patient's chart.

## 2024-08-11 ENCOUNTER — Ambulatory Visit: Payer: No Typology Code available for payment source | Admitting: Nurse Practitioner

## 2024-08-11 ENCOUNTER — Encounter: Payer: Self-pay | Admitting: Nurse Practitioner

## 2024-08-11 VITALS — BP 118/76 | HR 73 | Temp 98.4°F | Ht 61.0 in | Wt 165.6 lb

## 2024-08-11 DIAGNOSIS — L700 Acne vulgaris: Secondary | ICD-10-CM

## 2024-08-11 DIAGNOSIS — R7303 Prediabetes: Secondary | ICD-10-CM | POA: Diagnosis not present

## 2024-08-11 DIAGNOSIS — F419 Anxiety disorder, unspecified: Secondary | ICD-10-CM | POA: Diagnosis not present

## 2024-08-11 DIAGNOSIS — R252 Cramp and spasm: Secondary | ICD-10-CM

## 2024-08-11 DIAGNOSIS — Z862 Personal history of diseases of the blood and blood-forming organs and certain disorders involving the immune mechanism: Secondary | ICD-10-CM | POA: Diagnosis not present

## 2024-08-11 DIAGNOSIS — Z0000 Encounter for general adult medical examination without abnormal findings: Secondary | ICD-10-CM | POA: Diagnosis not present

## 2024-08-11 DIAGNOSIS — F32A Depression, unspecified: Secondary | ICD-10-CM

## 2024-08-11 LAB — IBC + FERRITIN
Ferritin: 7.3 ng/mL — ABNORMAL LOW (ref 10.0–291.0)
Iron: 99 ug/dL (ref 42–145)
Saturation Ratios: 19.3 % — ABNORMAL LOW (ref 20.0–50.0)
TIBC: 513.8 ug/dL — ABNORMAL HIGH (ref 250.0–450.0)
Transferrin: 367 mg/dL — ABNORMAL HIGH (ref 212.0–360.0)

## 2024-08-11 LAB — COMPREHENSIVE METABOLIC PANEL WITH GFR
ALT: 24 U/L (ref 0–35)
AST: 26 U/L (ref 0–37)
Albumin: 4.6 g/dL (ref 3.5–5.2)
Alkaline Phosphatase: 94 U/L (ref 39–117)
BUN: 23 mg/dL (ref 6–23)
CO2: 24 meq/L (ref 19–32)
Calcium: 9.2 mg/dL (ref 8.4–10.5)
Chloride: 104 meq/L (ref 96–112)
Creatinine, Ser: 0.91 mg/dL (ref 0.40–1.20)
GFR: 83.67 mL/min (ref >60.00)
Glucose, Bld: 82 mg/dL (ref 70–99)
Potassium: 4.6 meq/L (ref 3.5–5.1)
Sodium: 137 meq/L (ref 135–145)
Total Bilirubin: 0.3 mg/dL (ref 0.2–1.2)
Total Protein: 7.5 g/dL (ref 6.0–8.3)

## 2024-08-11 LAB — CBC WITH DIFFERENTIAL/PLATELET
Basophils Absolute: 0 K/uL (ref 0.0–0.1)
Basophils Relative: 0.3 % (ref 0.0–3.0)
Eosinophils Absolute: 0.2 K/uL (ref 0.0–0.7)
Eosinophils Relative: 2.3 % (ref 0.0–5.0)
HCT: 40.1 % (ref 36.0–46.0)
Hemoglobin: 13 g/dL (ref 12.0–15.0)
Lymphocytes Relative: 24.2 % (ref 12.0–46.0)
Lymphs Abs: 1.9 K/uL (ref 0.7–4.0)
MCHC: 32.3 g/dL (ref 30.0–36.0)
MCV: 82.9 fl (ref 78.0–100.0)
Monocytes Absolute: 0.7 K/uL (ref 0.1–1.0)
Monocytes Relative: 9.1 % (ref 3.0–12.0)
Neutro Abs: 5.1 K/uL (ref 1.4–7.7)
Neutrophils Relative %: 64.1 % (ref 43.0–77.0)
Platelets: 327 K/uL (ref 150.0–400.0)
RBC: 4.84 Mil/uL (ref 3.87–5.11)
RDW: 14.6 % (ref 11.5–15.5)
WBC: 8 K/uL (ref 4.0–10.5)

## 2024-08-11 LAB — HEMOGLOBIN A1C: Hgb A1c MFr Bld: 6 % (ref 4.6–6.5)

## 2024-08-11 NOTE — Assessment & Plan Note (Signed)
 She is planning isotretinoin therapy, aware of side effects, and prepared with skincare. Requires pregnancy testing and liver monitoring. Order liver function tests and ensure compliance with isotretinoin monitoring requirements. Follow up with Dermatology as scheduled.

## 2024-08-11 NOTE — Assessment & Plan Note (Signed)
 Noted during pregnancy. No longer taking iron  supplementation. We will check CBC and iron  panel.

## 2024-08-11 NOTE — Assessment & Plan Note (Addendum)
 Her depression and anxiety are managed with Zoloft , though anxiety persists. Buspar  was effective but caused side effects. Vistaril  aids sleep and anxiety. Continue Zoloft  50 mg daily and Vistaril  at bedtime and as needed. Monitor symptoms and consider increasing Zoloft  if anxiety worsens. Encouraged to contact if worsening symptoms, unusual behavior changes or suicidal thoughts occur.

## 2024-08-11 NOTE — Progress Notes (Signed)
 Natalie Glance, NP-C Phone: [phone]  Natalie Welch is a 33 y.o. female who presents today for annual exam.   Discussed the use of AI scribe software for clinical note transcription with the patient, who gave verbal consent to proceed.  History of Present Illness   Natalie Welch is a 33 year old female who presents for an annual physical exam.  She has been actively participating in a fitness program called [employer] since the end of May, initially attending four days a week and now three days a week due to schedule changes. She feels physically and mentally better, although she has not noticed significant weight loss. She tracks her nutrition using My [employer]itness Pal and focuses on increasing her protein intake.  She continues to take Zoloft  for anxiety and depression and reports improvement. She also takes Vistaril  at bedtime to aid with sleep, as she experiences difficulty sleeping without it. She has a history of low potassium levels, particularly noted during a previous hospitalization, and currently takes potassium supplements before gym sessions to prevent cramping. Despite drinking electrolyte drinks, she still experiences significant cramping during workouts.  Her menstrual periods have improved since the birth of her child, although they were initially heavy. She has had her tubes tied and reports no changes in family history regarding breast, ovarian, or colon cancers.  She is preparing to start Accutane and has a history of anemia during pregnancy and was noted to be prediabetic in her last blood work.  No chest pain, shortness of breath, abdominal pain, constipation, diarrhea, urinary symptoms, joint pain, swelling, or skin changes. She does not smoke, use drugs, or consume alcohol.      Social History   Tobacco Use  Smoking Status Never  Smokeless Tobacco Never    Current Outpatient Medications on File Prior to Visit  Medication Sig Dispense Refill   Clindamycin   Phos-Benzoyl Perox gel Apply daily in the mornings to spot treat inflamed acne bumps. 50 g 1   hydrOXYzine  (VISTARIL ) 25 MG capsule TAKE 1 CAPSULE (25 MG TOTAL) BY MOUTH EVERY 8 (EIGHT) HOURS AS NEEDED FOR ANXIETY. 270 capsule 1   sertraline  (ZOLOFT ) 50 MG tablet TAKE 1 TABLET BY MOUTH EVERY DAY 90 tablet 3   spironolactone  (ALDACTONE ) 50 MG tablet Take 1 tablet (50 mg total) by mouth daily. 30 tablet 0   tretinoin  (RETIN-A ) 0.025 % cream Apply pea-sized amount to face at bedtime, wash off in morning. 45 g 0   No current facility-administered medications on file prior to visit.     ROS see history of present illness  Objective  Physical Exam Vitals:   08/11/24 0819  BP: 118/76  Pulse: 73  Temp: 98.4 F (36.9 C)  SpO2: 98%    BP Readings from Last 3 Encounters:  08/11/24 118/76  03/03/24 118/66  02/04/24 118/80   Wt Readings from Last 3 Encounters:  08/11/24 165 lb 9.6 oz (75.1 kg)  03/03/24 156 lb 9.6 oz (71 kg)  02/04/24 162 lb (73.5 kg)    Physical Exam Constitutional:      General: She is not in acute distress.    Appearance: Normal appearance.  HENT:     Head: Normocephalic.     Right Ear: Tympanic membrane normal.     Left Ear: Tympanic membrane normal.     Nose: Nose normal.     Mouth/Throat:     Mouth: Mucous membranes are moist.     Pharynx: Oropharynx is clear.  Eyes:  Conjunctiva/sclera: Conjunctivae normal.     Pupils: Pupils are equal, round, and reactive to light.  Neck:     Thyroid : No thyromegaly.  Cardiovascular:     Rate and Rhythm: Normal rate and regular rhythm.     Heart sounds: Normal heart sounds.  Pulmonary:     Effort: Pulmonary effort is normal.     Breath sounds: Normal breath sounds.  Abdominal:     General: Abdomen is flat. Bowel sounds are normal.     Palpations: Abdomen is soft. There is no mass.     Tenderness: There is no abdominal tenderness.  Musculoskeletal:        General: Normal range of motion.  Lymphadenopathy:      Cervical: No cervical adenopathy.  Skin:    General: Skin is warm and dry.     Findings: No rash.  Neurological:     General: No focal deficit present.     Mental Status: She is alert.  Psychiatric:        Mood and Affect: Mood normal.        Behavior: Behavior normal.      Assessment/Plan: Please see individual problem list.  Routine general medical examination at a health care facility Assessment & Plan: Physical exam complete. We will check lab work as outlined. Pap smear is up to date, last in 2023 with Ob-Gyn. She maintains an active lifestyle with dietary tracking. Tetanus vaccine is up to date. She politely declines flu and COVID vaccines. Continue routine dental and eye exams. Encourage continued healthy diet and regular exercise. Return to care in one year, sooner as needed.    Muscle cramps Assessment & Plan: She experiences severe cramps during exercise despite adequate hydration and electrolytes, with a history of low potassium. Check potassium levels and consider low-dose potassium supplementation if levels are low.  Orders: -     Comprehensive metabolic panel with GFR  Acne vulgaris Assessment & Plan: She is planning isotretinoin therapy, aware of side effects, and prepared with skincare. Requires pregnancy testing and liver monitoring. Order liver function tests and ensure compliance with isotretinoin monitoring requirements. Follow up with Dermatology as scheduled.    Anxiety and depression Assessment & Plan: Her depression and anxiety are managed with Zoloft , though anxiety persists. Buspar  was effective but caused side effects. Vistaril  aids sleep and anxiety. Continue Zoloft  50 mg daily and Vistaril  at bedtime and as needed. Monitor symptoms and consider increasing Zoloft  if anxiety worsens. Encouraged to contact if worsening symptoms, unusual behavior changes or suicidal thoughts occur.    Prediabetes Assessment & Plan: Check A1c. Encourage continued  healthy diet and regular exercise.   Orders: -     Hemoglobin A1c  Hx of iron  deficiency anemia Assessment & Plan: Noted during pregnancy. No longer taking iron  supplementation. We will check CBC and iron  panel.   Orders: -     CBC with Differential/Platelet -     IBC + Ferritin    Return in about 1 year (around 08/11/2025) for Annual Exam, sooner as needed.   Natalie Glance, NP-C [facility]

## 2024-08-11 NOTE — Assessment & Plan Note (Signed)
 She experiences severe cramps during exercise despite adequate hydration and electrolytes, with a history of low potassium. Check potassium levels and consider low-dose potassium supplementation if levels are low.

## 2024-08-11 NOTE — Assessment & Plan Note (Signed)
 Physical exam complete. We will check lab work as outlined. Pap smear is up to date, last in 2023 with Ob-Gyn. She maintains an active lifestyle with dietary tracking. Tetanus vaccine is up to date. She politely declines flu and COVID vaccines. Continue routine dental and eye exams. Encourage continued healthy diet and regular exercise. Return to care in one year, sooner as needed.

## 2024-08-11 NOTE — Assessment & Plan Note (Signed)
 Check A1c. Encourage continued healthy diet and regular exercise.

## 2024-08-16 ENCOUNTER — Other Ambulatory Visit: Payer: Self-pay | Admitting: Dermatology

## 2024-08-16 NOTE — Telephone Encounter (Signed)
 Will start isotretinoin and stop spironolactone  at next follow up

## 2024-08-26 ENCOUNTER — Ambulatory Visit: Payer: Self-pay | Admitting: Nurse Practitioner

## 2024-08-29 ENCOUNTER — Ambulatory Visit: Admitting: Dermatology

## 2024-08-30 ENCOUNTER — Ambulatory Visit

## 2024-08-30 ENCOUNTER — Other Ambulatory Visit: Payer: Self-pay | Admitting: Nurse Practitioner

## 2024-08-30 DIAGNOSIS — L700 Acne vulgaris: Secondary | ICD-10-CM

## 2024-08-30 DIAGNOSIS — E611 Iron deficiency: Secondary | ICD-10-CM

## 2024-08-30 DIAGNOSIS — Z79899 Other long term (current) drug therapy: Secondary | ICD-10-CM

## 2024-08-30 DIAGNOSIS — L708 Other acne: Secondary | ICD-10-CM | POA: Diagnosis not present

## 2024-08-30 MED ORDER — ISOTRETINOIN 30 MG PO CAPS: ORAL_CAPSULE | ORAL | 0 refills | Status: AC

## 2024-08-30 NOTE — Patient Instructions (Signed)

## 2024-08-30 NOTE — Progress Notes (Signed)
 Subjective   Natalie Welch is a 33 y.o. female who presents for the following: Follow up of acne. Patient is established patient   Today patient reports: Acne - pt here today to start Isotretinoin, currently she is taking spironolactone  50 mg po QD, clindagel, and tretinoin  0.025%  Review of Systems:    No other skin or systemic complaints except as noted in HPI or Assessment and Plan.  The following portions of the chart were reviewed this encounter and updated as appropriate: medications, allergies, medical history  Relevant Medical History:  n/a   Objective  Well appearing patient in no apparent distress; mood and affect are within normal limits. Examination was performed of the: Focused Exam of: the face, chest, and back   Examination notable for: Acne Vulgaris - SEVERE: many open and closed comedones with associated erythema as well as tender nodules some of which are crusted located on the face and upper trunk.  Acneiform scarring is also present.  Examination limited by: Clothing     Assessment & Plan   Severe Inflammatory Acne, not controlled on current therapy  - Recalcitrant to topical retinol, spironolactone   - Discussed treatment options and patient would like to pursue treatment with isotretinoin - Discussed side effects including but not limited to, dryness/irritation, headaches, vision changes, joint pains, GI distress, mood changes and emphasized need to report symptoms immediately if they arise. In addition, discussed the potential association of isotretinoin and IBD. - Discussed that acne lesions may initially worsen with therapy. - Reviewed teratogenic potential of isotretinoin and importance of not sharing medication and not donating blood; gave iPledge materials today to review. - Discussed need to check two serum or urine pregnancy tests before providing the first prescription (in fertile women at registration and at least 1 month later during the first 5  days of the menstrual cycle). In addition discussed the need to check monthly serum or urine pregnancy tests while taking the medication. - Educated patient about the 7-day prescription window in which the prescription must be filled, counting the day of the blood draw or urine sample as day 1. - Enrolled in iPledge #: [id number] - start isotretinoin 30 mg daily  - hx of depression and postpartum depression, pt currently on sertraline  and hydroxyzine . She will contact the office if any new sx  - dc all other acne meds   High Risk Medication Use (isotretinoin) - In office UPT negative.  - Methods of contraception:tubial ligation, female latex condoms  - Check ALT, Triglycerides  - Patient understands that she must not become pregnant while on the medication - Patient understands to call with questions/concerns regarding the medication or side effects.  - Patient also understands importance of not sharing medication or donating blood while on therapy.  Isotretinoin Counseling; Review and Contraception Counseling: Reviewed potential side effects of isotretinoin including xerosis, cheilitis, hepatitis, hyperlipidemia, and severe birth defects if taken by a pregnant woman.  Women on isotretinoin must be celibate (not having sex) or required to use at least 2 birth control methods to prevent pregnancy (unless patient is a female of non-child bearing potential).  Females of child-bearing potential must have monthly pregnancy tests while on isotretinoin and report through I-Pledge (FDA monitoring program). Reviewed reports of suicidal ideation in those with a history of depression while taking isotretinoin and reports of diagnosis of inflammatory bowl disease (IBD) while taking isotretinoin as well as the lack of evidence for a causal relationship between isotretinoin, depression and IBD.  Patient advised to reach out with any questions or concerns. Patient advised not to share pills or donate blood while on  treatment or for one month after completing treatment. All patient's considering Isotretinoin must read and understand and sign Isotretinoin Consent Form and be registered with I-Pledge.    Procedures, orders, diagnosis for this visit:    There are no diagnoses linked to this encounter.  Return to clinic: Return in about 1 month (around 09/29/2024) for isotretinoin follow up.  LILLETTE Rosina Mayans, CMA, am acting as scribe for Lauraine JAYSON Kanaris, MD .   Documentation: I have reviewed the above documentation for accuracy and completeness, and I agree with the above.  Lauraine JAYSON Kanaris, MD

## 2024-09-07 ENCOUNTER — Inpatient Hospital Stay

## 2024-09-07 ENCOUNTER — Inpatient Hospital Stay: Admitting: Licensed Clinical Social Worker

## 2024-09-07 ENCOUNTER — Inpatient Hospital Stay: Attending: Oncology | Admitting: Oncology

## 2024-09-07 ENCOUNTER — Encounter: Payer: Self-pay | Admitting: Oncology

## 2024-09-07 VITALS — BP 123/87 | HR 92 | Temp 98.0°F | Resp 18 | Ht 61.0 in | Wt 168.8 lb

## 2024-09-07 DIAGNOSIS — Z79899 Other long term (current) drug therapy: Secondary | ICD-10-CM | POA: Insufficient documentation

## 2024-09-07 DIAGNOSIS — Z862 Personal history of diseases of the blood and blood-forming organs and certain disorders involving the immune mechanism: Secondary | ICD-10-CM

## 2024-09-07 DIAGNOSIS — D509 Iron deficiency anemia, unspecified: Secondary | ICD-10-CM | POA: Diagnosis not present

## 2024-09-07 DIAGNOSIS — E785 Hyperlipidemia, unspecified: Secondary | ICD-10-CM | POA: Diagnosis not present

## 2024-09-07 DIAGNOSIS — N920 Excessive and frequent menstruation with regular cycle: Secondary | ICD-10-CM | POA: Insufficient documentation

## 2024-09-07 DIAGNOSIS — F410 Panic disorder [episodic paroxysmal anxiety] without agoraphobia: Secondary | ICD-10-CM

## 2024-09-07 NOTE — Progress Notes (Unsigned)
 Hematology/Oncology Consult note [facility] [phone] [phone]  Patient Care Team: Gretel App, NP as PCP - General (Nurse Practitioner)   Name of the patient: Natalie Welch  [id number]  [birth year]    Reason for referral-iron  deficiency anemia   Referring physician-Kacy Gretel, NP  Date of visit: 09/07/24   History of presenting illness- Patient is a 33 year old female with a past medical history, hyperlipidemia.  From 08/11/2024 showed white cell count of 8, H&H of 13/40.1 with a platelet count of 327.  He was noted to have a hemoglobin around 7.5 in June 2024.  Ferritin levels on 08/11/2024 were low at 7.3 with an elevated TIBC of 513.  ECOG PS- ***  Pain scale- ***   Review of systems- ROS  Allergies  Allergen Reactions  . Abilify [Aripiprazole] Other (See Comments)    Oculogyric crisis --eyes rolled in back of head  . Ibuprofen  Nausea And Vomiting    Only with higher strengths -- i.e., 600mg   . Nuvigil [Armodafinil] Swelling    Tongue and throat swelling  . Prozac [Fluoxetine] Itching    Tongue and throat itchy  . Doxycycline  Other (See Comments)    Causes vaginal candidiasis     Patient Active Problem List   Diagnosis Date Noted  . Prediabetes 08/11/2024  . Hx of iron  deficiency anemia 08/11/2024  . Routine general medical examination at a health care facility 08/11/2024  . Muscle cramps 08/11/2024  . Acne vulgaris 08/11/2024  . Obesity (BMI 30-39.9) 01/08/2024  . Carpal tunnel syndrome of left wrist 01/08/2024  . Anxiety and depression 07/30/2023  . Encounter for well adult exam with abnormal findings 07/30/2023  . Skin lesion 05/11/2023  . Hx gestational diabetes 05/11/2023  . History of gestational hypertension 05/11/2023  . Hyperlipidemia 09/28/2020  . Previous cesarean section 02/07/2020  . Anemia during pregnancy 11/16/2019  . Generalized anxiety disorder with panic attacks 08/05/2019     Past  Medical History:  Diagnosis Date  . Acute calculous cholecystitis 06/10/2021  . Allergy   . Anemia   . Anxiety    no meds  . Biliary obstruction (HCC)   . Cholelithiasis with biliary obstruction 06/15/2021  . Complication of anesthesia   . Depression   . Encounter for well adult exam with abnormal findings 07/30/2023  . GERD (gastroesophageal reflux disease)   . Gestational diabetes   . PONV (postoperative nausea and vomiting)      Past Surgical History:  Procedure Laterality Date  . CESAREAN SECTION N/A 02/07/2020   Procedure: CESAREAN SECTION;  Surgeon: Leonce Garnette BIRCH, MD;  Location: [facility] ORS;  Service: Obstetrics;  Laterality: N/A;  . CESAREAN SECTION WITH BILATERAL TUBAL LIGATION Bilateral 04/03/2023   Procedure: REPEAT CESAREAN SECTION WITH BILATERAL TUBAL LIGATION;  Surgeon: Leonce Garnette BIRCH, MD;  Location: [facility] ORS;  Service: Obstetrics;  Laterality: Bilateral;  . CHOLECYSTECTOMY  06/17/2021  . DILATION AND EVACUATION N/A 12/30/2018   Procedure: DILATATION AND EVACUATION;  Surgeon: Lake Read, MD;  Location: [facility] ORS;  Service: Gynecology;  Laterality: N/A;  . ENDOSCOPIC RETROGRADE CHOLANGIOPANCREATOGRAPHY (ERCP) WITH PROPOFOL  N/A 06/17/2021   Procedure: ENDOSCOPIC RETROGRADE CHOLANGIOPANCREATOGRAPHY (ERCP) WITH PROPOFOL ;  Surgeon: Avram Lupita BRAVO, MD;  Location: [facility] ENDOSCOPY;  Service: Endoscopy;  Laterality: N/A;  . REMOVAL OF STONES  06/17/2021   Procedure: REMOVAL OF STONES;  Surgeon: Avram Lupita BRAVO, MD;  Location: [facility] ENDOSCOPY;  Service: Endoscopy;;  . ANNETT  06/17/2021   Procedure: SPHINCTEROTOMY;  Surgeon: Avram Lupita BRAVO, MD;  Location: [facility] ENDOSCOPY;  Service: Endoscopy;;  . TUBAL LIGATION  04/03/23  . WISDOM TOOTH EXTRACTION      Social History   Socioeconomic History  . Marital status: Married    Spouse name: Rankin  . Number of children: 2  . Years of education: Not on file  . Highest education level: 12th grade  Occupational History   . Not on file  Tobacco Use  . Smoking status: Never  . Smokeless tobacco: Never  Vaping Use  . Vaping status: Never Used  Substance and Sexual Activity  . Alcohol use: Not Currently    Alcohol/week: 7.0 standard drinks of alcohol  . Drug use: Never  . Sexual activity: Yes    Birth control/protection: Surgical, None  Other Topics Concern  . Not on file  Social History Narrative  . Not on file   Social Drivers of Health   Financial Resource Strain: Low Risk  (08/09/2024)   Overall Financial Resource Strain (CARDIA)   . Difficulty of Paying Living Expenses: Not hard at all  Food Insecurity: No Food Insecurity (09/07/2024)   Hunger Vital Sign   . Worried About Programme Researcher, Broadcasting/film/video in the Last Year: Never true   . Ran Out of Food in the Last Year: Never true  Transportation Needs: No Transportation Needs (09/07/2024)   PRAPARE - Transportation   . Lack of Transportation (Medical): No   . Lack of Transportation (Non-Medical): No  Physical Activity: Sufficiently Active (08/09/2024)   Exercise Vital Sign   . Days of Exercise per Week: 3 days   . Minutes of Exercise per Session: 50 min  Stress: Stress Concern Present (08/09/2024)   [employer] of Occupational Health - Occupational Stress Questionnaire   . Feeling of Stress: To some extent  Social Connections: Socially Integrated (08/09/2024)   Social Connection and Isolation Panel   . Frequency of Communication with Friends and Family: Twice a week   . Frequency of Social Gatherings with Friends and Family: Once a week   . Attends Religious Services: More than 4 times per year   . Active Member of Clubs or Organizations: Yes   . Attends Banker Meetings: More than 4 times per year   . Marital Status: Married  Catering Manager Violence: Not At Risk (09/07/2024)   Humiliation, Afraid, Rape, and Kick questionnaire   . Fear of Current or Ex-Partner: No   . Emotionally Abused: No   . Physically Abused: No   .  Sexually Abused: No     Family History  Problem Relation Age of Onset  . Hypertension Mother   . Healthy Father   . Skin cancer Maternal Grandmother   . Diabetes Paternal Grandmother      Current Outpatient Medications:  .  hydrOXYzine  (VISTARIL ) 25 MG capsule, TAKE 1 CAPSULE (25 MG TOTAL) BY MOUTH EVERY 8 (EIGHT) HOURS AS NEEDED FOR ANXIETY., Disp: 270 capsule, Rfl: 1 .  ISOtretinoin (ACCUTANE) 30 MG capsule, Take one cap po QD with a fatty meal., Disp: 30 capsule, Rfl: 0 .  sertraline  (ZOLOFT ) 50 MG tablet, TAKE 1 TABLET BY MOUTH EVERY DAY, Disp: 90 tablet, Rfl: 3 .  Clindamycin  Phos-Benzoyl Perox gel, Apply daily in the mornings to spot treat inflamed acne bumps. (Patient not taking: Reported on 09/07/2024), Disp: 50 g, Rfl: 1 .  spironolactone  (ALDACTONE ) 50 MG tablet, TAKE 1 TABLET BY MOUTH EVERY DAY (Patient not taking: Reported on 09/07/2024), Disp: 30 tablet, Rfl: 0   Physical exam:  Vitals:   09/07/24 1441  BP: 123/87  Pulse: 92  Resp: 18  Temp: 98 F (36.7 C)  TempSrc: Tympanic  SpO2: 99%  Weight: 168 lb 12.8 oz (76.6 kg)  Height: 5' 1 (1.549 m)   Physical Exam        Latest Ref Rng & Units 08/11/2024    8:56 AM  CMP  Glucose 70 - 99 mg/dL 82   BUN 6 - 23 mg/dL 23   Creatinine 9.59 - 1.20 mg/dL 9.08   Sodium 864 - 854 mEq/L 137   Potassium 3.5 - 5.1 mEq/L 4.6   Chloride 96 - 112 mEq/L 104   CO2 19 - 32 mEq/L 24   Calcium  8.4 - 10.5 mg/dL 9.2   Total Protein 6.0 - 8.3 g/dL 7.5   Total Bilirubin 0.2 - 1.2 mg/dL 0.3   Alkaline Phos 39 - 117 U/L 94   AST 0 - 37 U/L 26   ALT 0 - 35 U/L 24       Latest Ref Rng & Units 08/11/2024    8:56 AM  CBC  WBC 4.0 - 10.5 K/uL 8.0   Hemoglobin 12.0 - 15.0 g/dL 86.9   Hematocrit 63.9 - 46.0 % 40.1   Platelets 150.0 - 400.0 K/uL 327.0     No images are attached to the encounter.  No results found.  Assessment and plan- Patient is a 33 y.o. female ***   Thank you for this kind referral and the opportunity  to participate in the care of this  Patient   Visit Diagnosis No diagnosis found.  Dr. Annah Skene, MD, MPH [facility] [phone] 09/07/2024

## 2024-09-07 NOTE — Progress Notes (Unsigned)
 New patient; Referral for from Leron Glance NP: Iron  deficiency.

## 2024-09-08 ENCOUNTER — Other Ambulatory Visit: Payer: Self-pay | Admitting: Oncology

## 2024-09-09 ENCOUNTER — Encounter: Payer: Self-pay | Admitting: Oncology

## 2024-09-13 ENCOUNTER — Inpatient Hospital Stay

## 2024-09-13 VITALS — BP 122/77 | HR 85 | Temp 96.7°F | Resp 14

## 2024-09-13 DIAGNOSIS — D509 Iron deficiency anemia, unspecified: Secondary | ICD-10-CM | POA: Diagnosis not present

## 2024-09-13 DIAGNOSIS — Z862 Personal history of diseases of the blood and blood-forming organs and certain disorders involving the immune mechanism: Secondary | ICD-10-CM

## 2024-09-13 MED ORDER — IRON SUCROSE 20 MG/ML IV SOLN
200.0000 mg | INTRAVENOUS | Status: DC
  Administered 2024-09-13: 200 mg via INTRAVENOUS
  Filled 2024-09-13: qty 10

## 2024-09-13 NOTE — Patient Instructions (Signed)

## 2024-09-16 ENCOUNTER — Inpatient Hospital Stay

## 2024-09-16 VITALS — BP 118/80 | HR 73 | Temp 98.5°F | Resp 16

## 2024-09-16 DIAGNOSIS — Z862 Personal history of diseases of the blood and blood-forming organs and certain disorders involving the immune mechanism: Secondary | ICD-10-CM

## 2024-09-16 DIAGNOSIS — D509 Iron deficiency anemia, unspecified: Secondary | ICD-10-CM | POA: Diagnosis not present

## 2024-09-16 MED ORDER — IRON SUCROSE 20 MG/ML IV SOLN
200.0000 mg | INTRAVENOUS | Status: DC
  Administered 2024-09-16: 200 mg via INTRAVENOUS
  Filled 2024-09-16: qty 10

## 2024-09-16 NOTE — Patient Instructions (Signed)

## 2024-09-19 ENCOUNTER — Inpatient Hospital Stay

## 2024-09-19 VITALS — BP 124/75 | HR 74 | Temp 97.8°F

## 2024-09-19 DIAGNOSIS — D509 Iron deficiency anemia, unspecified: Secondary | ICD-10-CM | POA: Diagnosis not present

## 2024-09-19 DIAGNOSIS — Z862 Personal history of diseases of the blood and blood-forming organs and certain disorders involving the immune mechanism: Secondary | ICD-10-CM

## 2024-09-19 MED ORDER — SODIUM CHLORIDE 0.9% FLUSH
10.0000 mL | Freq: Once | INTRAVENOUS | Status: AC | PRN
  Administered 2024-09-19: 10 mL
  Filled 2024-09-19: qty 10

## 2024-09-19 MED ORDER — IRON SUCROSE 20 MG/ML IV SOLN
200.0000 mg | INTRAVENOUS | Status: DC
  Administered 2024-09-19: 200 mg via INTRAVENOUS
  Filled 2024-09-19: qty 10

## 2024-09-21 ENCOUNTER — Other Ambulatory Visit: Payer: Self-pay | Admitting: Dermatology

## 2024-09-21 NOTE — Telephone Encounter (Signed)
 Patient states that she is no longer using spironolactone  since starting isotretinoin . Rx refill request denied to pharmacy.

## 2024-09-26 ENCOUNTER — Inpatient Hospital Stay: Attending: Oncology

## 2024-09-26 VITALS — BP 132/72 | HR 69 | Temp 98.6°F | Resp 14

## 2024-09-26 DIAGNOSIS — D509 Iron deficiency anemia, unspecified: Secondary | ICD-10-CM | POA: Insufficient documentation

## 2024-09-26 DIAGNOSIS — Z862 Personal history of diseases of the blood and blood-forming organs and certain disorders involving the immune mechanism: Secondary | ICD-10-CM

## 2024-09-26 MED ORDER — IRON SUCROSE 20 MG/ML IV SOLN
200.0000 mg | INTRAVENOUS | Status: DC
  Administered 2024-09-26: 200 mg via INTRAVENOUS
  Filled 2024-09-26: qty 10

## 2024-09-26 NOTE — Patient Instructions (Signed)

## 2024-09-28 ENCOUNTER — Inpatient Hospital Stay

## 2024-09-28 VITALS — BP 116/76 | HR 83 | Temp 98.8°F | Resp 14

## 2024-09-28 DIAGNOSIS — D509 Iron deficiency anemia, unspecified: Secondary | ICD-10-CM | POA: Diagnosis not present

## 2024-09-28 DIAGNOSIS — Z862 Personal history of diseases of the blood and blood-forming organs and certain disorders involving the immune mechanism: Secondary | ICD-10-CM

## 2024-09-28 MED ORDER — IRON SUCROSE 20 MG/ML IV SOLN
200.0000 mg | INTRAVENOUS | Status: DC
  Administered 2024-09-28: 200 mg via INTRAVENOUS
  Filled 2024-09-28: qty 10

## 2024-09-28 NOTE — Patient Instructions (Signed)

## 2024-09-29 ENCOUNTER — Ambulatory Visit

## 2024-09-29 DIAGNOSIS — Z7189 Other specified counseling: Secondary | ICD-10-CM

## 2024-09-29 DIAGNOSIS — Z79899 Other long term (current) drug therapy: Secondary | ICD-10-CM

## 2024-09-29 DIAGNOSIS — L700 Acne vulgaris: Secondary | ICD-10-CM | POA: Diagnosis not present

## 2024-09-29 MED ORDER — ISOTRETINOIN 30 MG PO CAPS: 60.0000 mg | ORAL_CAPSULE | Freq: Every day | ORAL | 0 refills | Status: AC

## 2024-09-29 MED ORDER — ISOTRETINOIN 30 MG PO CAPS: 30.0000 mg | ORAL_CAPSULE | Freq: Two times a day (BID) | ORAL | 0 refills | Status: DC

## 2024-09-29 NOTE — Patient Instructions (Signed)

## 2024-09-29 NOTE — Progress Notes (Signed)
    Subjective   Natalie Welch is a 33 y.o. female who presents for the following: Acne on Accutane  . Patient is established patient   Today patient reports: Patient states she did vomit her first day of taking the medication but has been fine ever since, states no other complications. She is on the 30 mg.  Review of Systems:    Isotretinoin  ROS - denies any bone pain, muscle pain, nausea, vomiting, diarrhea, blood in stool or urine .  The following portions of the chart were reviewed this encounter and updated as appropriate: medications, allergies, medical history  Relevant Medical History:  n/a   Objective  (SKPE) Well appearing patient in no apparent distress; mood and affect are within normal limits. Examination was performed of the: face, chest, and back   Examination notable for: inflamed papules on face  Examination limited by: Undergarments, Shoes or socks , and Clothing     Assessment & Plan  (SKAP)   Severe Inflammatory Acne, on Isotretinoin  Chronic condition with exacerbation or progression. Condition is not at treatment goal.  - Previous doses (30 mg)  - iPledge #: [id number]  - Dosage weight: 76.6 kg - Cumulative dose: 11.75 mg/kg  - Increase isotretinoin  60 mg daily   High Risk Medication Use (isotretinoin ) - UPT obtained today and negative Lot :[id number]  Exp: 02/08/2026 - Methods of contraception: tubal ligation, female latex condoms   - Patient understands that she must not become pregnant while on the medication - Patient understands to call with questions/concerns regarding the medication or side effects.  - Patient also understands importance of not sharing medication or donating blood while on therapy.  Isotretinoin  Counseling; Review and Contraception Counseling: Reviewed potential side effects of isotretinoin  including xerosis, cheilitis, hepatitis, hyperlipidemia, and severe birth defects if taken by a pregnant woman.  Women on isotretinoin  must  be celibate (not having sex) or required to use at least 2 birth control methods to prevent pregnancy (unless patient is a female of non-child bearing potential).  Females of child-bearing potential must have monthly pregnancy tests while on isotretinoin  and report through I-Pledge (FDA monitoring program). Reviewed reports of suicidal ideation in those with a history of depression while taking isotretinoin  and reports of diagnosis of inflammatory bowl disease (IBD) while taking isotretinoin  as well as the lack of evidence for a causal relationship between isotretinoin , depression and IBD. Patient advised to reach out with any questions or concerns. Patient advised not to share pills or donate blood while on treatment or for one month after completing treatment. All patient's considering Isotretinoin  must read and understand and sign Isotretinoin  Consent Form and be registered with I-Pledge.  Patient instructions (SKPI)   Procedures, orders, diagnosis for this visit:    There are no diagnoses linked to this encounter.  Return to clinic: Return in about 30 days (around 10/29/2024) for w/ Dr. Raymund.  I, Almetta Nora, RMA, am acting as scribe for Lauraine JAYSON Raymund, MD .   Documentation: I have reviewed the above documentation for accuracy and completeness, and I agree with the above.  Lauraine JAYSON Raymund, MD

## 2024-10-24 ENCOUNTER — Encounter: Payer: Self-pay | Admitting: Oncology

## 2024-10-31 ENCOUNTER — Ambulatory Visit (INDEPENDENT_AMBULATORY_CARE_PROVIDER_SITE_OTHER)

## 2024-10-31 ENCOUNTER — Encounter: Payer: Self-pay | Admitting: Oncology

## 2024-10-31 DIAGNOSIS — L700 Acne vulgaris: Secondary | ICD-10-CM | POA: Diagnosis not present

## 2024-10-31 MED ORDER — TRETINOIN 0.025 % EX CREA: TOPICAL_CREAM | Freq: Every day | CUTANEOUS | 0 refills | Status: AC

## 2024-10-31 MED ORDER — SPIRONOLACTONE 50 MG PO TABS: 100.0000 mg | ORAL_TABLET | Freq: Every day | ORAL | 0 refills | Status: AC

## 2024-10-31 NOTE — Patient Instructions (Addendum)

## 2024-10-31 NOTE — Progress Notes (Signed)
 "   Subjective   Natalie Welch is a 34 y.o. female who presents for the following: Acne on Accutane  . Patient is established patient   Today patient reports: Patient is currently taking 60 mg. Patient states over christmas was sick and didn't take isotretinoin  for a week, due to not taking medication she developed a decrease in her mental health causing mood swings and suicidal thoughts and decided to start taking 1 pill 30 mg since christmas.   She states she is feeling better from a mood perspective today. Still with some anxiety/depressing but denies any thoughts of harming herself or others. States mood sx primarily brought on by life stressors, family over the holidays.    Review of Systems:    Isotretinoin  ROS - denies any bone pain, muscle pain, nausea, vomiting, diarrhea, blood in stool or urine .  The following portions of the chart were reviewed this encounter and updated as appropriate: medications, allergies, medical history  Relevant Medical History:  n/a   Objective  (SKPE) Well appearing patient in no apparent distress; mood and affect are within normal limits. Examination was performed of the: Focused Exam of: Face   Examination notable for: erythematous papules on face  Examination limited by: Undergarments, Shoes or socks , and Clothing     Assessment & Plan  (SKAP)   Acne vulgaris - moderate and inflammatory - Chronic and persistent condition with duration or expected duration over one year. Condition is symptomatic and bothersome to patient. Patient is flaring and not currently at treatment goal.  - Currently on isotretinoin  s/p 2 months (30,60) - states over the holidays became ill with viral illness causing her to stop medication for about a week. States around this time also developed significant mood symptoms with thoughts of self harm. Notes many life stressors/family issues around this time. She feels mood has stabalized back to baseline now - denies any  thoughts of harm/self harm. Mutually decided to stop isotretinoin  today. She will reach out to her PCP to discuss if any dose adjustments are needed of psych meds and for referral for therapy.    - message sent to PCP  - start tretinoin  0.025% cream in the evening. Educated patient about proper use and potential side effects, including dryness, irritation, sun sensitivity, and transient worsening of acne. - Start spironolactone  100 mg PO daily (max dose: 100mg  BID, can start lower if concern for side effects) - Discussed risk of benefits of this medication as well as common side effects. Explained that the most common side effect is polyuria which is why we recommend she take the medication in the morning. Other side effects include dizziness, breast tenderness/enlargement (which usually resolves after 1 month), and headaches. Also informed the patient that there is a chance this medication could increase her potassium and we will require occasional blood tests.   - reviewed most recent CMP with normal potassium  - intermittently takes potassium prior to workouts due to hypoK in past - recommended discontinuing this given spiro can elevate potassium  - Discussed pregnancy category X and patient should discontinue the medication if trying to conceive or if becomes pregnant. - Screened for renal and cardiac dz and personal and family history of breast cancer - No need to screen for underlying hyperkalemia except if >44 years old, h/o renal or cardiac disease, underlying hepatic function, high doses (>200mg /day), or on ACEi's/NSAIDs/Bactrim - After the full discussion patient opted to proceed to treat her acne with spironolactone .  Procedures,  orders, diagnosis for this visit:    There are no diagnoses linked to this encounter.  Return to clinic: Return in about 3 months (around 01/29/2025) for Acne, w/ Dr. Raymund.  I, Almetta Nora, RMA, am acting as scribe for Lauraine JAYSON Raymund, MD  .   Documentation: I have reviewed the above documentation for accuracy and completeness, and I agree with the above.  Lauraine JAYSON Raymund, MD  "

## 2024-11-07 ENCOUNTER — Encounter: Payer: Self-pay | Admitting: Oncology

## 2024-11-07 ENCOUNTER — Inpatient Hospital Stay: Attending: Oncology

## 2024-11-07 DIAGNOSIS — D509 Iron deficiency anemia, unspecified: Secondary | ICD-10-CM | POA: Diagnosis present

## 2024-11-07 DIAGNOSIS — Z862 Personal history of diseases of the blood and blood-forming organs and certain disorders involving the immune mechanism: Secondary | ICD-10-CM

## 2024-11-07 LAB — CBC (CANCER CENTER ONLY)
HCT: 37.9 % (ref 36.0–46.0)
Hemoglobin: 12.5 g/dL (ref 12.0–15.0)
MCH: 28.4 pg (ref 26.0–34.0)
MCHC: 33 g/dL (ref 30.0–36.0)
MCV: 86.1 fL (ref 80.0–100.0)
Platelet Count: 256 K/uL (ref 150–400)
RBC: 4.4 MIL/uL (ref 3.87–5.11)
RDW: 14.1 % (ref 11.5–15.5)
WBC Count: 7.5 K/uL (ref 4.0–10.5)
nRBC: 0 % (ref 0.0–0.2)

## 2024-11-07 LAB — IRON AND TIBC
Iron: 88 ug/dL (ref 28–170)
Saturation Ratios: 28 % (ref 10.4–31.8)
TIBC: 319 ug/dL (ref 250–450)
UIBC: 231 ug/dL

## 2024-11-07 LAB — FERRITIN: Ferritin: 278 ng/mL (ref 11–307)

## 2024-11-15 ENCOUNTER — Telehealth: Payer: Self-pay

## 2024-11-15 NOTE — Telephone Encounter (Signed)
 Called pt to check in. States doing well on spiro without side effects.

## 2025-01-11 ENCOUNTER — Inpatient Hospital Stay: Admitting: Oncology

## 2025-01-11 ENCOUNTER — Inpatient Hospital Stay

## 2025-01-30 ENCOUNTER — Ambulatory Visit

## 2025-08-15 ENCOUNTER — Encounter: Admitting: Nurse Practitioner
